# Patient Record
Sex: Female | Born: 1935 | Race: White | Hispanic: No | Marital: Married | Smoking: Never smoker
Health system: Southern US, Community
[De-identification: ages and names within clinical notes are randomized; demographics above are authoritative.]

## PROBLEM LIST (undated history)

## (undated) DIAGNOSIS — A389 Scarlet fever, uncomplicated: Secondary | ICD-10-CM

## (undated) DIAGNOSIS — E785 Hyperlipidemia, unspecified: Secondary | ICD-10-CM

## (undated) DIAGNOSIS — E119 Type 2 diabetes mellitus without complications: Secondary | ICD-10-CM

## (undated) DIAGNOSIS — J309 Allergic rhinitis, unspecified: Secondary | ICD-10-CM

## (undated) DIAGNOSIS — J329 Chronic sinusitis, unspecified: Secondary | ICD-10-CM

## (undated) DIAGNOSIS — I4891 Unspecified atrial fibrillation: Secondary | ICD-10-CM

## (undated) DIAGNOSIS — Z8719 Personal history of other diseases of the digestive system: Secondary | ICD-10-CM

## (undated) DIAGNOSIS — I10 Essential (primary) hypertension: Secondary | ICD-10-CM

## (undated) DIAGNOSIS — R439 Unspecified disturbances of smell and taste: Secondary | ICD-10-CM

## (undated) DIAGNOSIS — R51 Headache: Secondary | ICD-10-CM

## (undated) DIAGNOSIS — R42 Dizziness and giddiness: Secondary | ICD-10-CM

## (undated) DIAGNOSIS — A3790 Whooping cough, unspecified species without pneumonia: Secondary | ICD-10-CM

## (undated) DIAGNOSIS — G4700 Insomnia, unspecified: Secondary | ICD-10-CM

## (undated) HISTORY — DX: Headache: R51

## (undated) HISTORY — DX: Hyperlipidemia, unspecified: E78.5

## (undated) HISTORY — DX: Personal history of other diseases of the digestive system: Z87.19

## (undated) HISTORY — PX: TONSILLECTOMY: SHX5217

## (undated) HISTORY — PX: BILATERAL OOPHORECTOMY: SHX1221

## (undated) HISTORY — DX: Insomnia, unspecified: G47.00

## (undated) HISTORY — PX: COLONOSCOPY W/ POLYPECTOMY: SHX1380

## (undated) HISTORY — PX: CATARACT EXTRACTION, BILATERAL: SHX1313

## (undated) HISTORY — DX: Whooping cough, unspecified species without pneumonia: A37.90

## (undated) HISTORY — DX: Chronic sinusitis, unspecified: J32.9

## (undated) HISTORY — DX: Unspecified disturbances of smell and taste: R43.9

## (undated) HISTORY — DX: Dizziness and giddiness: R42

## (undated) HISTORY — PX: ABDOMINAL HYSTERECTOMY: SHX81

## (undated) HISTORY — PX: OTHER SURGICAL HISTORY: SHX169

## (undated) HISTORY — DX: Scarlet fever, uncomplicated: A38.9

## (undated) HISTORY — DX: Allergic rhinitis, unspecified: J30.9

## (undated) HISTORY — DX: Essential (primary) hypertension: I10

## (undated) HISTORY — PX: CYSTOSCOPY: SHX5120

## (undated) HISTORY — PX: HEMORRHOID SURGERY: SHX153

---

## 1999-09-22 ENCOUNTER — Ambulatory Visit (HOSPITAL_COMMUNITY): Admission: RE | Admit: 1999-09-22 | Discharge: 1999-09-22 | Payer: Self-pay | Admitting: Internal Medicine

## 1999-09-22 ENCOUNTER — Encounter: Payer: Self-pay | Admitting: Internal Medicine

## 1999-11-23 ENCOUNTER — Inpatient Hospital Stay (HOSPITAL_COMMUNITY): Admission: EM | Admit: 1999-11-23 | Discharge: 1999-11-25 | Payer: Self-pay | Admitting: Emergency Medicine

## 1999-11-23 ENCOUNTER — Encounter: Payer: Self-pay | Admitting: Internal Medicine

## 1999-11-25 ENCOUNTER — Encounter: Payer: Self-pay | Admitting: Internal Medicine

## 2000-01-20 ENCOUNTER — Ambulatory Visit (HOSPITAL_COMMUNITY): Admission: RE | Admit: 2000-01-20 | Discharge: 2000-01-20 | Payer: Self-pay | Admitting: Gastroenterology

## 2000-03-18 ENCOUNTER — Ambulatory Visit (HOSPITAL_COMMUNITY): Admission: RE | Admit: 2000-03-18 | Discharge: 2000-03-18 | Payer: Self-pay | Admitting: Obstetrics and Gynecology

## 2000-03-18 ENCOUNTER — Encounter: Payer: Self-pay | Admitting: Urology

## 2001-09-08 ENCOUNTER — Other Ambulatory Visit: Admission: RE | Admit: 2001-09-08 | Discharge: 2001-09-08 | Payer: Self-pay | Admitting: Obstetrics and Gynecology

## 2002-11-26 ENCOUNTER — Encounter: Payer: Self-pay | Admitting: Obstetrics and Gynecology

## 2002-11-28 ENCOUNTER — Inpatient Hospital Stay (HOSPITAL_COMMUNITY): Admission: RE | Admit: 2002-11-28 | Discharge: 2002-11-30 | Payer: Self-pay | Admitting: Obstetrics and Gynecology

## 2002-11-28 ENCOUNTER — Encounter (INDEPENDENT_AMBULATORY_CARE_PROVIDER_SITE_OTHER): Payer: Self-pay

## 2003-10-23 ENCOUNTER — Other Ambulatory Visit: Admission: RE | Admit: 2003-10-23 | Discharge: 2003-10-23 | Payer: Self-pay | Admitting: Obstetrics and Gynecology

## 2004-05-21 ENCOUNTER — Encounter: Admission: RE | Admit: 2004-05-21 | Discharge: 2004-05-21 | Payer: Self-pay | Admitting: Internal Medicine

## 2004-09-24 ENCOUNTER — Ambulatory Visit: Payer: Self-pay | Admitting: Internal Medicine

## 2004-11-17 ENCOUNTER — Other Ambulatory Visit: Admission: RE | Admit: 2004-11-17 | Discharge: 2004-11-17 | Payer: Self-pay | Admitting: Obstetrics and Gynecology

## 2005-04-01 ENCOUNTER — Ambulatory Visit: Payer: Self-pay | Admitting: Internal Medicine

## 2005-08-31 ENCOUNTER — Ambulatory Visit: Payer: Self-pay | Admitting: Internal Medicine

## 2005-09-13 ENCOUNTER — Ambulatory Visit: Payer: Self-pay | Admitting: Internal Medicine

## 2005-09-20 ENCOUNTER — Ambulatory Visit: Payer: Self-pay | Admitting: Internal Medicine

## 2005-11-30 ENCOUNTER — Ambulatory Visit: Payer: Self-pay | Admitting: Internal Medicine

## 2006-02-10 ENCOUNTER — Ambulatory Visit: Payer: Self-pay | Admitting: Internal Medicine

## 2006-08-31 ENCOUNTER — Ambulatory Visit: Payer: Self-pay | Admitting: Internal Medicine

## 2006-12-29 ENCOUNTER — Ambulatory Visit: Payer: Self-pay | Admitting: Internal Medicine

## 2006-12-29 LAB — CONVERTED CEMR LAB
Basophils Absolute: 0.1 10*3/uL (ref 0.0–0.1)
Basophils Relative: 0.7 % (ref 0.0–1.0)
Calcium: 9.4 mg/dL (ref 8.4–10.5)
HCT: 38 % (ref 36.0–46.0)
Hemoglobin: 12.7 g/dL (ref 12.0–15.0)
MCHC: 33.5 g/dL (ref 30.0–36.0)
MCV: 90.9 fL (ref 78.0–100.0)
Monocytes Relative: 6.5 % (ref 3.0–11.0)
Platelets: 352 10*3/uL (ref 150–400)
RBC: 4.18 M/uL (ref 3.87–5.11)

## 2007-01-02 ENCOUNTER — Ambulatory Visit: Payer: Self-pay | Admitting: Internal Medicine

## 2007-01-02 LAB — CONVERTED CEMR LAB
Glucose, 2 hour: 278 mg/dL — ABNORMAL HIGH (ref 70–139)
Glucose, Fasting: 122 mg/dL — ABNORMAL HIGH (ref 70–99)

## 2007-05-03 ENCOUNTER — Ambulatory Visit: Payer: Self-pay | Admitting: Internal Medicine

## 2007-07-20 ENCOUNTER — Ambulatory Visit: Payer: Self-pay | Admitting: Internal Medicine

## 2007-08-29 ENCOUNTER — Ambulatory Visit: Payer: Self-pay | Admitting: Internal Medicine

## 2007-09-11 ENCOUNTER — Encounter: Admission: RE | Admit: 2007-09-11 | Discharge: 2007-09-11 | Payer: Self-pay | Admitting: Orthopedic Surgery

## 2007-09-12 ENCOUNTER — Ambulatory Visit (HOSPITAL_BASED_OUTPATIENT_CLINIC_OR_DEPARTMENT_OTHER): Admission: RE | Admit: 2007-09-12 | Discharge: 2007-09-12 | Payer: Self-pay | Admitting: Orthopedic Surgery

## 2007-09-26 ENCOUNTER — Encounter: Payer: Self-pay | Admitting: Internal Medicine

## 2007-10-16 ENCOUNTER — Encounter: Payer: Self-pay | Admitting: Internal Medicine

## 2007-10-30 ENCOUNTER — Encounter: Payer: Self-pay | Admitting: *Deleted

## 2007-10-30 DIAGNOSIS — J329 Chronic sinusitis, unspecified: Secondary | ICD-10-CM | POA: Insufficient documentation

## 2007-10-30 DIAGNOSIS — R439 Unspecified disturbances of smell and taste: Secondary | ICD-10-CM | POA: Insufficient documentation

## 2007-10-30 DIAGNOSIS — Z8719 Personal history of other diseases of the digestive system: Secondary | ICD-10-CM | POA: Insufficient documentation

## 2007-10-30 DIAGNOSIS — Z9849 Cataract extraction status, unspecified eye: Secondary | ICD-10-CM | POA: Insufficient documentation

## 2007-10-30 DIAGNOSIS — Z87898 Personal history of other specified conditions: Secondary | ICD-10-CM | POA: Insufficient documentation

## 2007-10-30 DIAGNOSIS — E785 Hyperlipidemia, unspecified: Secondary | ICD-10-CM | POA: Insufficient documentation

## 2007-10-30 DIAGNOSIS — A3790 Whooping cough, unspecified species without pneumonia: Secondary | ICD-10-CM | POA: Insufficient documentation

## 2007-10-30 DIAGNOSIS — J309 Allergic rhinitis, unspecified: Secondary | ICD-10-CM | POA: Insufficient documentation

## 2007-10-30 DIAGNOSIS — Z9079 Acquired absence of other genital organ(s): Secondary | ICD-10-CM | POA: Insufficient documentation

## 2007-10-30 DIAGNOSIS — Z9189 Other specified personal risk factors, not elsewhere classified: Secondary | ICD-10-CM | POA: Insufficient documentation

## 2007-10-30 DIAGNOSIS — A389 Scarlet fever, uncomplicated: Secondary | ICD-10-CM | POA: Insufficient documentation

## 2007-10-30 DIAGNOSIS — I10 Essential (primary) hypertension: Secondary | ICD-10-CM | POA: Insufficient documentation

## 2007-10-30 DIAGNOSIS — G4700 Insomnia, unspecified: Secondary | ICD-10-CM | POA: Insufficient documentation

## 2007-10-30 DIAGNOSIS — Z9889 Other specified postprocedural states: Secondary | ICD-10-CM | POA: Insufficient documentation

## 2008-01-05 ENCOUNTER — Encounter: Payer: Self-pay | Admitting: Internal Medicine

## 2008-02-07 LAB — HM MAMMOGRAPHY: HM Mammogram: NORMAL

## 2008-03-27 ENCOUNTER — Ambulatory Visit: Payer: Self-pay | Admitting: Internal Medicine

## 2008-03-27 LAB — CONVERTED CEMR LAB
BUN: 12 mg/dL (ref 6–23)
CO2: 32 meq/L (ref 19–32)
Calcium: 9.4 mg/dL (ref 8.4–10.5)
Chloride: 106 meq/L (ref 96–112)
Cholesterol: 109 mg/dL (ref 0–200)
Creatinine, Ser: 0.6 mg/dL (ref 0.4–1.2)
GFR calc Af Amer: 125 mL/min
HDL: 37.1 mg/dL — ABNORMAL LOW (ref >39.0)
LDL Cholesterol: 51 mg/dL (ref 0–99)
Potassium: 4.9 meq/L (ref 3.5–5.1)
Sodium: 143 meq/L (ref 135–145)
Total CHOL/HDL Ratio: 2.9
Total Protein: 7.1 g/dL (ref 6.0–8.3)
Triglycerides: 106 mg/dL (ref 0–149)
VLDL: 21 mg/dL (ref 0–40)

## 2008-04-25 ENCOUNTER — Telehealth: Payer: Self-pay | Admitting: Internal Medicine

## 2008-09-02 ENCOUNTER — Ambulatory Visit: Payer: Self-pay | Admitting: Internal Medicine

## 2009-04-02 ENCOUNTER — Telehealth: Payer: Self-pay | Admitting: Internal Medicine

## 2009-04-10 ENCOUNTER — Encounter: Payer: Self-pay | Admitting: Internal Medicine

## 2009-04-29 ENCOUNTER — Telehealth: Payer: Self-pay | Admitting: Internal Medicine

## 2009-05-05 ENCOUNTER — Ambulatory Visit: Payer: Self-pay | Admitting: Internal Medicine

## 2009-05-09 ENCOUNTER — Ambulatory Visit: Payer: Self-pay | Admitting: Internal Medicine

## 2009-05-09 LAB — CONVERTED CEMR LAB
ALT: 14 units/L (ref 0–35)
Albumin: 4 g/dL (ref 3.5–5.2)
BUN: 15 mg/dL (ref 6–23)
Bilirubin, Direct: 0.1 mg/dL (ref 0.0–0.3)
Eosinophils Absolute: 0.3 10*3/uL (ref 0.0–0.7)
Eosinophils Relative: 4.7 % (ref 0.0–5.0)
GFR calc non Af Amer: 86.44 mL/min (ref >60)
Glucose, Bld: 106 mg/dL — ABNORMAL HIGH (ref 70–99)
HCT: 36.4 % (ref 36.0–46.0)
Hemoglobin: 12.3 g/dL (ref 12.0–15.0)
Monocytes Absolute: 0.5 10*3/uL (ref 0.1–1.0)
Platelets: 254 10*3/uL (ref 150.0–400.0)
Potassium: 5.2 meq/L — ABNORMAL HIGH (ref 3.5–5.1)
RBC: 3.91 M/uL (ref 3.87–5.11)
Sodium: 143 meq/L (ref 135–145)
TSH: 1.41 microintl units/mL (ref 0.35–5.50)
Total Bilirubin: 0.7 mg/dL (ref 0.3–1.2)
Total CHOL/HDL Ratio: 3
VLDL: 24.8 mg/dL (ref 0.0–40.0)
WBC: 7.4 10*3/uL (ref 4.5–10.5)

## 2009-08-13 ENCOUNTER — Ambulatory Visit: Payer: Self-pay | Admitting: Internal Medicine

## 2009-11-03 ENCOUNTER — Telehealth: Payer: Self-pay | Admitting: Internal Medicine

## 2009-12-10 ENCOUNTER — Telehealth: Payer: Self-pay | Admitting: Internal Medicine

## 2009-12-11 ENCOUNTER — Telehealth (INDEPENDENT_AMBULATORY_CARE_PROVIDER_SITE_OTHER): Payer: Self-pay | Admitting: *Deleted

## 2009-12-11 ENCOUNTER — Encounter: Payer: Self-pay | Admitting: Internal Medicine

## 2009-12-15 ENCOUNTER — Ambulatory Visit: Payer: Self-pay | Admitting: Internal Medicine

## 2009-12-16 ENCOUNTER — Telehealth: Payer: Self-pay | Admitting: Internal Medicine

## 2009-12-16 DIAGNOSIS — E1140 Type 2 diabetes mellitus with diabetic neuropathy, unspecified: Secondary | ICD-10-CM | POA: Insufficient documentation

## 2010-01-05 ENCOUNTER — Telehealth: Payer: Self-pay | Admitting: Internal Medicine

## 2010-03-17 ENCOUNTER — Telehealth: Payer: Self-pay | Admitting: Internal Medicine

## 2010-03-30 ENCOUNTER — Encounter: Payer: Self-pay | Admitting: Internal Medicine

## 2010-05-08 ENCOUNTER — Ambulatory Visit: Payer: Self-pay | Admitting: Internal Medicine

## 2010-05-08 LAB — CONVERTED CEMR LAB
AST: 27 units/L (ref 0–37)
Albumin: 4.3 g/dL (ref 3.5–5.2)
Alkaline Phosphatase: 49 units/L (ref 39–117)
Basophils Relative: 0.4 % (ref 0.0–3.0)
Chloride: 109 meq/L (ref 96–112)
Cholesterol: 106 mg/dL (ref 0–200)
Creatinine, Ser: 0.8 mg/dL (ref 0.4–1.2)
HDL: 48.6 mg/dL (ref >39.00)
Lymphocytes Relative: 32.3 % (ref 12.0–46.0)
Lymphs Abs: 2.8 10*3/uL (ref 0.7–4.0)
MCHC: 33.8 g/dL (ref 30.0–36.0)
MCV: 92.5 fL (ref 78.0–100.0)
Neutro Abs: 4.8 10*3/uL (ref 1.4–7.7)
Neutrophils Relative %: 56.7 % (ref 43.0–77.0)
RDW: 13.2 % (ref 11.5–14.6)
Total Bilirubin: 0.6 mg/dL (ref 0.3–1.2)
Total CHOL/HDL Ratio: 2
Total Protein: 7.2 g/dL (ref 6.0–8.3)

## 2010-07-24 ENCOUNTER — Telehealth: Payer: Self-pay | Admitting: Internal Medicine

## 2010-08-06 ENCOUNTER — Ambulatory Visit: Payer: Self-pay | Admitting: Internal Medicine

## 2010-12-17 NOTE — Progress Notes (Signed)
  Phone Note Refill Request Message from:  Fax from Pharmacy on Mar 17, 2010 4:40 PM  Refills Requested: Medication #1:  METOPROLOL TARTRATE 50 MG  TABS Take 1 1/2 tablets twice daily Initial call taken by: Ami Bullins CMA,  Mar 17, 2010 4:40 PM    Prescriptions: METOPROLOL TARTRATE 50 MG  TABS (METOPROLOL TARTRATE) Take 1 1/2 tablets twice daily  #270 x 2   Entered by:   Ami Bullins CMA   Authorized by:   Jacques Navy MD   Signed by:   Bill Salinas CMA on 03/17/2010   Method used:   Electronically to        [facility] [address] (retail)       [address]       [place], [place]  [address]       Ph: [phone] or [phone]       Fax: [phone]   RxID:   [id number]

## 2010-12-17 NOTE — Progress Notes (Signed)
Summary: Ambien refill  Phone Note Refill Request Message from:  Fax from Pharmacy on January 05, 2010 11:18 AM  Refills Requested: Medication #1:  AMBIEN 10 MG  TABS Take as needed and as directed.   Last Refilled: 10/27/2009 pt had CPX on 05/05/2009, Please Advise refill  Initial call taken by: Ami Bullins CMA,  January 05, 2010 11:19 AM  Follow-up for Phone Call        ok to refill prn Follow-up by: Jacques Navy MD,  January 05, 2010 4:08 PM    Prescriptions: AMBIEN 10 MG  TABS (ZOLPIDEM TARTRATE) Take as needed and as directed.  #15 x 12   Entered by:   Lucious Groves   Authorized by:   Jacques Navy MD   Signed by:   Lucious Groves on 01/06/2010   Method used:   Telephoned to ...       [facility] [address] (retail)       [address]       [place], [place]  [address]       Ph: [phone] or [phone]       Fax: [phone]   RxID:   [id number]

## 2010-12-17 NOTE — Progress Notes (Signed)
  Phone Note Refill Request Message from:  Fax from Pharmacy on July 24, 2010 2:47 PM  Refills Requested: Medication #1:  AMBIEN 10 MG  TABS Take as needed and as directed.   Last Refilled: 05/25/2010 fax from [facility] [address] please Advise refill  Initial call taken by: Ami Bullins CMA,  July 24, 2010 2:47 PM  Follow-up for Phone Call        OK for refill x 5 Follow-up by: Jacques Navy MD,  July 24, 2010 4:48 PM    Prescriptions: AMBIEN 10 MG  TABS (ZOLPIDEM TARTRATE) Take as needed and as directed.  #15 x 5   Entered by:   Ami Bullins CMA   Authorized by:   Jacques Navy MD   Signed by:   Bill Salinas CMA on 07/24/2010   Method used:   Telephoned to ...       [facility] [address] [facility] (retail)       [address]       [place], [place]  [address]       Ph: [phone] or [phone]       Fax: [phone]   RxID:   [id number]

## 2010-12-17 NOTE — Assessment & Plan Note (Signed)
Summary: YEARLY---STC   Vital Signs:  Patient profile:   77 year old female Height:      64 inches Weight:      127 pounds BMI:     21.88 O2 Sat:      97 % on Room air Temp:     97.2 degrees F oral Pulse rate:   54 / minute BP sitting:   110 / 62  (left arm) Cuff size:   regular  Vitals Entered By: Bill Salinas CMA (May 08, 2010 9:56 AM)  O2 Flow:  Room air CC: pt here for cpx, she states her last colonoscopy was done with Dr Laural Benes ab  Vision Screening:      Vision Comments: Last eye exam was April 2011 with Dr Leonia Corona at [facility]. 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  History of Present Illness: Patient presents for health maintenance exam. She has not had any new medical problems or injuries or surgeries in the interval.   She still has trouble with her knees: trouble going up stairs, walking is painful. She is taking celebrex which does help with her pain.   Current Medications (verified): 1)  Altace 2.5 Mg  Caps (Ramipril) .... Take One Capsule One Daily 2)  Aspirin 81 Mg  Tabs (Aspirin) .... Take One Tablet Once Daily 3)  Metoprolol Tartrate 50 Mg  Tabs (Metoprolol Tartrate) .... Take 1 1/2 Tablets Twice Daily 4)  Vytorin 10-80 Mg  Tabs (Ezetimibe-Simvastatin) .... Take One Tablet Once Dail 5)  Calcium 1200mg  .... Take One Tablet Twice Daily 6)  Multivitamins   Tabs (Multiple Vitamin) .... Take One Tablet Once Daily 7)  Bl Vitamin C 1000 Mg  Tabs (Ascorbic Acid) .... Take 2 Tablets Twice Daily 8)  Omega 3 2000mg  .... Take Twice Daily 9)  Ambien 10 Mg  Tabs (Zolpidem Tartrate) .... Take As Needed and As Directed. 10)  Prilosec Otc .... As Needed 11)  Celebrex 200 Mg Caps (Celecoxib) .... Take 1 Tablet By Mouth Once A Day 12)  Vitamin C 500mg  .... Take 1  Tablet By Mouth Once A Day 13)  Betamethasone Dipropionate 0.05 % Crea (Betamethasone Dipropionate) .... Apply Once Daily To Hands As Needed  Allergies: 1)  ! Demerol 2)  ! Tetracycline 3)  ! Erythromycin 4)  ! Cleocin 5)  ! * Actifed 6)  ! Morphine  Past History:  Past Medical History: Last updated: 10/30/2007 HYPERTENSION (ICD-401.9) ALLERGIC RHINITIS (ICD-477.9) DYSPEPSIA, HX OF (ICD-V12.79) ANOSMIA (ICD-781.1) INSOMNIA (ICD-780.52) SINUSITIS, CHRONIC (ICD-473.9) HYPERLIPIDEMIA (ICD-272.4) HEADACHES, HX OF (ICD-V13.8) Hx of WHOOPING COUGH (ICD-033.9) Hx of SCARLET FEVER (ICD-034.1)  Past Surgical History: Last updated: 10/30/2007 OOPHORECTOMY, BILATERAL, HX OF (ICD-V45.77) * CYSTOSCOPY WITH STENTING OF BOTH URETERS. HYDROSALPINX DIAGNOSTIC LAPAROSCOPY (ICD-614.1) * CORRECTION OF HAMMERTOE DEFORMITY * VARICOSE VEIN SURGERY CATARACT EXTRACTIONS, BILATERAL, HX OF (ICD-V45.61) HYSTERECTOMY, HX OF (ICD-V45.77) HEMORRHOIDECTOMY, HX OF (ICD-V45.89) TONSILLECTOMY, UNDER AGE 82, HX OF (ICD-V15.89)    Family History: Last updated: Apr 15, 2008 father- deceased @67 : CVA, HTN, ?lipids mother-deceased @ 75: ruptured viscus-?cancer, coagulopathy, DM Neg- colon cancer PGM-breast cancer  Social History: Last updated: 05/05/2009 HSG married - '52 1 son - '61, lost a son ; 2 daughter '54, '63; 7 grandchildren; 4 great-grands Mostly  homemaker, 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 Death of step-grandson by suicide - Sp. '10  Risk Factors: Alcohol Use: 0 (05/05/2009) Caffeine Use: 2 cups (05/05/2009) Diet: healthy (05/05/2009) Exercise: yes (05/05/2009)  Risk Factors: Smoking Status: never (05/05/2009)  Review of Systems       The patient complains of weight loss.  The patient denies anorexia, fever, weight gain, vision loss, decreased hearing, chest pain, syncope, dyspnea on exertion, peripheral edema, prolonged cough, abdominal pain, severe  indigestion/heartburn, muscle weakness, transient blindness, difficulty walking, depression, enlarged lymph nodes, angioedema, and breast masses.         133.5 down to 127.  Climbing stair is hard  Physical Exam  General:  WNWD white female in no distress Head:  Normocephalic and atraumatic without obvious abnormalities. No apparent alopecia or balding. Eyes:  vision grossly intact, pupils equal, pupils round, corneas and lenses clear, and no injection.   Ears:  R ear normal and L ear normal.   Nose:  no external deformity and no external erythema.   Mouth:  Oral mucosa and oropharynx without lesions or exudates.  Teeth in good repair. Neck:  supple, full ROM, no thyromegaly, and no carotid bruits.   Chest Wall:  No deformities, masses, or tenderness noted. Breasts:  deferred to gyn Lungs:  Normal respiratory effort, chest expands symmetrically. Lungs are clear to auscultation, no crackles or wheezes. Heart:  Normal rate and regular rhythm. S1 and S2 normal without gallop, murmur, click, rub or other extra sounds. Abdomen:  soft, non-tender, normal bowel sounds, and no guarding.   Genitalia:  deferred to gyn Msk:  normal ROM, no joint tenderness, no joint swelling, no redness over joints, and no joint deformities.   Pulses:  2+ radial pulses Extremities:  No clubbing, cyanosis, edema, or deformity noted with normal full range of motion of all joints.  Knees without effusion, full ROM. Neurologic:  alert & oriented X3, cranial nerves II-XII intact, strength normal in all extremities, gait normal, and DTRs symmetrical and normal.   Skin:  turgor normal, color normal, and no ulcerations.   Cervical Nodes:  no anterior cervical adenopathy and no posterior cervical adenopathy.   Psych:  Oriented X3, memory intact for recent and remote, normally interactive, and good eye contact.     Impression & Recommendations:  Problem # 1:  DIABETES MELLITUS, CONTROLLED (ICD-250.00) Due for A1C with  recommendations to follow.  Her updated medication list for this problem includes:    Altace 2.5 Mg Caps (Ramipril) .Marland Kitchen... Take one capsule one daily    Aspirin 81 Mg Tabs (Aspirin) .Marland Kitchen... Take one tablet once daily  Orders: TLB-A1C / Hgb A1C (Glycohemoglobin) (83036-A1C)  Addendum - A1C 6.5% - good control with diet management alone.  Problem # 2:  HYPERTENSION (ICD-401.9)  Her updated medication list for this problem includes:    Altace 2.5 Mg Caps (Ramipril) .Marland Kitchen... Take one capsule one daily    Metoprolol Tartrate 50 Mg Tabs (Metoprolol tartrate) .Marland Kitchen... Take 1 1/2 tablets twice daily  Orders: TLB-BMP (Basic Metabolic Panel-BMET) (80048-METABOL)  BP today: 110/62 Prior BP: 128/72 (05/05/2009)  Good control on present medications. Lab results are normal for renal function and electrolytes.  Problem # 3:  DYSPEPSIA, HX OF (ICD-V12.79) Stable with no active complaints of discomfort or indigestion.  Problem # 4:  HYPERLIPIDEMIA (ICD-272.4) Due for routine lab with recommendations to follow.  Her updated medication list for this problem includes:    Vytorin 10-80 Mg Tabs (  Ezetimibe-simvastatin) .Marland Kitchen... Take one tablet once dail  Orders: TLB-Lipid Panel (80061-LIPID) TLB-Hepatic/Liver Function Pnl (80076-HEPATIC) TLB-TSH (Thyroid Stimulating Hormone) (84443-TSH)  Addendum - LDL 35! Excellent control. She has been on high dose simvastatin for greatert than 1 year with no adverse effects.  Plan - continue present medications.  Problem # 5:  Preventive Health Care (ICD-V70.0) Interval history is benign except for on-going knee pain. She does not contemplate TKR at this time although this may be a need in the future. Her exam is unremarkable and lab results are within normal limits. She is current is Gyn, breast cancer screening, colon cancer screening with colonoscopy in '07. Immunizations - Penumovax '04, Shingles '08. She may be due for tetnus.   She is in good spirits with no  evidence of depression. She remains active despite her knee pain and has recognizes the need for care to prevent falls or injury.  In summary - a very nice woman who appears to be medically stable at this time with good control of her chronic ailments. She will return as needed or 1 year.  Complete Medication List: 1)  Altace 2.5 Mg Caps (Ramipril) .... Take one capsule one daily 2)  Aspirin 81 Mg Tabs (Aspirin) .... Take one tablet once daily 3)  Metoprolol Tartrate 50 Mg Tabs (Metoprolol tartrate) .... Take 1 1/2 tablets twice daily 4)  Vytorin 10-80 Mg Tabs (Ezetimibe-simvastatin) .... Take one tablet once dail 5)  Calcium 1200mg   .... Take one tablet twice daily 6)  Multivitamins Tabs (Multiple vitamin) .... Take one tablet once daily 7)  Bl Vitamin C 1000 Mg Tabs (Ascorbic acid) .... Take 2 tablets twice daily 8)  Omega 3 2000mg   .... Take twice daily 9)  Ambien 10 Mg Tabs (Zolpidem tartrate) .... Take as needed and as directed. 10)  Prilosec Otc  .... As needed 11)  Celebrex 200 Mg Caps (Celecoxib) .... Take 1 tablet by mouth once a day 12)  Vitamin C 500mg   .... Take 1 tablet by mouth once a day 13)  Betamethasone Dipropionate 0.05 % Crea (Betamethasone dipropionate) .... Apply once daily to hands as needed  Other Orders: Subsequent annual wellness visit with prevention plan (X9147) TLB-CBC Platelet - w/Differential (85025-CBCD)   Patient: Toni Mcmillan Note: All result statuses are Final unless otherwise noted.  Tests: (1) Hemoglobin A1C (A1C)   Hemoglobin A1C            6.4 %                       4.6-6.5     Glycemic Control Guidelines for People with Diabetes:     Non Diabetic:  <6%     Goal of Therapy: <7%     Additional Action Suggested:  >8%   Tests: (2) BMP (METABOL)   Sodium                    143 mEq/L                   135-145   Potassium            [H]  5.5 mEq/L                   3.5-5.1   Chloride                  109 mEq/L  96-112    Carbon Dioxide            31 mEq/L                    19-32   Glucose              [H]  102 mg/dL                   60-45   BUN                       19 mg/dL                    4-09   Creatinine                0.8 mg/dL                   8.1-1.9   Calcium                   9.7 mg/dL                   1.4-78.2   GFR                       77.23 mL/min                >60  Tests: (3) Lipid Panel (LIPID)   Cholesterol               106 mg/dL                   9-562     ATP III Classification            Desirable:  < 200 mg/dL                    Borderline High:  200 - 239 mg/dL               High:  > = 240 mg/dL   Triglycerides             114.0 mg/dL                 1.3-086.5     Normal:  <150 mg/dL     Borderline High:  784 - 199 mg/dL   HDL                       69.62 mg/dL                 >95.28   VLDL Cholesterol          22.8 mg/dL                  4.1-32.4   LDL Cholesterol           35 mg/dL                    4-01  CHO/HDL Ratio:  CHD Risk                             2                    Men          Women     1/2 Average Risk  3.4          3.3     Average Risk          5.0          4.4     2X Average Risk          9.6          7.1     3X Average Risk          15.0          11.0                           Tests: (4) Hepatic/Liver Function Panel (HEPATIC)   Total Bilirubin           0.6 mg/dL                   2.7-2.5   Direct Bilirubin          0.1 mg/dL                   3.6-6.4   Alkaline Phosphatase      49 U/L                      39-117   AST                       27 U/L                      0-37   ALT                       15 U/L                      0-35   Total Protein             7.2 g/dL                    4.0-3.4   Albumin                   4.3 g/dL                    7.4-2.5  Tests: (5) CBC Platelet w/Diff (CBCD)   White Cell Count          8.5 K/uL                    4.5-10.5   Red Cell Count       [L]  3.86 Mil/uL                 3.87-5.11   Hemoglobin                 12.0 g/dL                   95.6-38.7   Hematocrit           [L]  35.7 %                      36.0-46.0   MCV                       92.5 fl  78.0-100.0   MCHC                      33.8 g/dL                   78.2-95.6   RDW                       13.2 %                      11.5-14.6   Platelet Count            305.0 K/uL                  150.0-400.0   Neutrophil %              56.7 %                      43.0-77.0   Lymphocyte %              32.3 %                      12.0-46.0   Monocyte %                7.3 %                       3.0-12.0   Eosinophils%              3.3 %                       0.0-5.0   Basophils %               0.4 %                       0.0-3.0   Neutrophill Absolute      4.8 K/uL                    1.4-7.7   Lymphocyte Absolute       2.8 K/uL                    0.7-4.0   Monocyte Absolute         0.6 K/uL                    0.1-1.0  Eosinophils, Absolute                             0.3 K/uL                    0.0-0.7   Basophils Absolute        0.0 K/uL                    0.0-0.1  Tests: (6) TSH (TSH)   FastTSH                   0.97 uIU/mL                 0.35-5.50  Preventive Care Screening  Bone Density:    Date:  04/10/2009    Results:  abnormal std dev

## 2010-12-17 NOTE — Assessment & Plan Note (Signed)
Summary: FLU SHOT/ MEN /NWS  Nurse Visit   Allergies: 1)  ! Demerol 2)  ! Tetracycline 3)  ! Erythromycin 4)  ! Cleocin 5)  ! * Actifed 6)  ! Morphine  Orders Added: 1)  Flu Vaccine 34yrs + MEDICARE PATIENTS [Q2039] 2)  Administration Flu vaccine - MCR [G0008] Flu Vaccine Consent Questions     Do you have a history of severe allergic reactions to this vaccine? no    Any prior history of allergic reactions to egg and/or gelatin? no    Do you have a sensitivity to the preservative Thimersol? no    Do you have a past history of Guillan-Barre Syndrome? no    Do you currently have an acute febrile illness? no    Have you ever had a severe reaction to latex? no    Vaccine information given and explained to patient? yes    Are you currently pregnant? no    Lot Number:[id number]   Exp Date:05/15/2011   Site Given  Left Deltoid IM.lbmedflu

## 2010-12-17 NOTE — Progress Notes (Signed)
Summary: diabetic labs  Phone Note Call from Patient Call back at Home Phone [phone]   Summary of Call: Patient has tried doing very well in regards to her diet. She has lost several pounds and would like diabetes testing done again, per the patient she is "borderline". Patient said that she last had these done in Sept 2010. Please advise. Initial call taken by: Lucious Groves,  December 11, 2009 12:02 PM  Follow-up for Phone Call        OK for A1C 790.6 Follow-up by: Jacques Navy MD,  December 11, 2009 1:21 PM  Additional Follow-up for Phone Call Additional follow up Details #1::        Pt informed, please put lab in IDX Additional Follow-up by: Lamar Sprinkles, CMA,  December 11, 2009 5:54 PM    Additional Follow-up for Phone Call Additional follow up Details #2::    Lab in IDX. Follow-up by: Verdell Face,  December 15, 2009 8:04 AM

## 2010-12-17 NOTE — Progress Notes (Signed)
  Phone Note Outgoing Call   Reason for Call: Discuss lab or test results Summary of Call: lab A1C 6.7%  patient called Initial call taken by: Jacques Navy MD,  December 16, 2009 9:10 AM  New Problems: DIABETES MELLITUS, CONTROLLED (ICD-250.00)   New Problems: DIABETES MELLITUS, CONTROLLED (ICD-250.00)

## 2010-12-17 NOTE — Progress Notes (Signed)
Summary: Vytorin alternative  Phone Note Call from Patient Call back at Home Phone [phone]   Caller: [employer]--[phone]  Call For: ID:  [phone]  Summary of Call: Patient left message on triage that her insurance company [facility] [employer])will no longer cover Vytorin. ( I am already aware that they are stopping coverage for Benicar and Vytorin, luckily she is not on Benicar)  Which prescription should the patient be switched to? Please advise. Initial call taken by: Lucious Groves,  December 10, 2009 10:41 AM  Follow-up for Phone Call        Patient is controlled on vytorin 10/80. Being on maximum dose of simvastatin does not allow single drug therapy in patient with known CAD.   Option #1 PA for vytorin Option #2 simvastatin 80 + estimbe 10 as seperate prescriptions Option #3 (least desireable) simvastatin 80 + Welchol daily. Follow-up by: Jacques Navy MD,  December 10, 2009 1:32 PM  Additional Follow-up for Phone Call Additional follow up Details #1::        Patient stopped by the office and prefers to take Vytorin if we can get it. I made the pat aware that I will try to get via PA. Patient will call to let me know how many pills she has at home.  **Spoke with [employer] and they will fax 2 forms. Additional Follow-up by: Lucious Groves,  December 11, 2009 12:01 PM    Additional Follow-up for Phone Call Additional follow up Details #2::    forms awaiting MD completion.Lucious Groves,  December 11, 2009 1:58 PM  Vytorin approved with no expiration date. Patient notified. Follow-up by: Lucious Groves,  December 12, 2009 4:05 PM

## 2010-12-17 NOTE — Letter (Signed)
[facility]   Imported By: Lester Zemple 04/09/2010 09:14:22  _____________________________________________________________________  External Attachment:    Type:   Image     Comment:   External Document

## 2010-12-17 NOTE — Medication Information (Signed)
Summary: [facility] form/[employer] [place]  [facility] [facility] [place]   Imported By: Lester Hardin 01/03/2010 09:08:27  _____________________________________________________________________  External Attachment:    Type:   Image     Comment:   External Document

## 2010-12-18 ENCOUNTER — Telehealth: Payer: Self-pay | Admitting: Internal Medicine

## 2010-12-23 NOTE — Progress Notes (Signed)
  Phone Note Refill Request Message from:  Fax from Pharmacy on December 18, 2010 8:07 AM  Refills Requested: Medication #1:  METOPROLOL TARTRATE 50 MG  TABS Take 1 1/2 tablets twice daily Initial call taken by: Ami Bullins CMA,  December 18, 2010 8:07 AM    Prescriptions: METOPROLOL TARTRATE 50 MG  TABS (METOPROLOL TARTRATE) Take 1 1/2 tablets twice daily  #270 x 2   Entered by:   Ami Bullins CMA   Authorized by:   Jacques Navy MD   Signed by:   Bill Salinas CMA on 12/18/2010   Method used:   Electronically to        [facility] [address] (retail)       [address]       [place], [place]  [address]       Ph: [phone] or [phone]       Fax: [phone]   RxID:   [id number]

## 2011-02-25 ENCOUNTER — Telehealth: Payer: Self-pay | Admitting: *Deleted

## 2011-02-25 NOTE — Telephone Encounter (Signed)
k to refill prn

## 2011-02-25 NOTE — Telephone Encounter (Signed)
Fax from [facility] [address] for Zolpidem tartrate 10mg  take one tablet by mouth as directed QTY 15 last filled 10/03/2010. Please Advise refills

## 2011-02-26 MED ORDER — ZOLPIDEM TARTRATE 10 MG PO TABS: 10.0000 mg | ORAL_TABLET | Freq: Every evening | ORAL | Status: DC | PRN

## 2011-02-26 NOTE — Telephone Encounter (Signed)
refill 

## 2011-03-23 ENCOUNTER — Telehealth: Payer: Self-pay | Admitting: *Deleted

## 2011-03-23 NOTE — Telephone Encounter (Signed)
[place] and requesting RX's for amoxicillin and eye drops for pink eye to have on hand in case of infection.

## 2011-03-24 MED ORDER — CIPROFLOXACIN HCL 500 MG PO TABS: ORAL_TABLET | ORAL | Status: AC

## 2011-03-24 MED ORDER — GENTAMICIN SULFATE 0.3 % OP SOLN: OPHTHALMIC | Status: AC

## 2011-03-24 NOTE — Telephone Encounter (Signed)
Left mess to call office back.  - RX's sent in

## 2011-03-24 NOTE — Telephone Encounter (Signed)
See response to Edwena Felty

## 2011-03-25 NOTE — Telephone Encounter (Signed)
Patient informed. 

## 2011-03-30 NOTE — Letter (Signed)
July 20, 2007    Leonides Grills, M.D.  [address]  [place], [place] [address]   RE:  Toni Mcmillan, Toni Mcmillan  MRN:  [id number]  /  DOB:  [birth year]   Dear Renae Fickle:   I had seen Toni Mcmillan today in pre-op evaluation for  anticipated surgery on the 5th digit of her left foot.   Enclosed please fine my complete H&P dictated December 29, 2006.  In the  interval since that visit, the patient has been seen and evaluated at  [facility] including a stress nuclear study which  shows no evidence of any ischemia with an ejection fraction of 79%.  It  was not felt that she had any significant coronary artery disease by  this study.   The patient has been diagnosed as a diabetic since her last full  physical exam.  She had positive 2-hour glucose tolerance test.  She has  been following a sugar free low carb diet and her last hemoglobin A1c  from May 03, 2007, was 6.9%, indicating good control.   It is my opinion that Toni Mcmillan is a stable candidate for general  anesthesia and surgery.  She has only minimal increased risk due to her  hypertension and diabetes, although both are well controlled at this  time.   Should she require any inpatient stay after her surgery, my colleagues  and myself would be happy to follow her along in hospital.   If I can provide any additional information, please do not hesitate to  contact me.   As always I appreciate the care you have rendered to my patient.    Sincerely,      Rosalyn Gess. Norins, MD  Electronically Signed    MEN/MedQ  DD: 07/20/2007  DT: 07/20/2007  Job #: [id number]

## 2011-03-30 NOTE — Op Note (Signed)
NAMECLARISSE, Toni Mcmillan             ACCOUNT NO.:  [id number]   MEDICAL RECORD NO.:  [id number]          PATIENT TYPE:  AMB   LOCATION:  DSC                          FACILITY:  [facility]   PHYSICIAN:  Leonides Grills, M.D.     DATE OF BIRTH:  1931/02/06   DATE OF PROCEDURE:  09/12/2007  DATE OF DISCHARGE:                               OPERATIVE REPORT   PREOPERATIVE DIAGNOSES:  1. Right fifth toe proximal phalanx head spur.  2. Right fifth hammertoe.   POSTOPERATIVE DIAGNOSES:  1. Right fifth toe proximal phalanx head spur.  2. Right fifth hammertoe.   OPERATION:  1. Left fifth toe proximal phalanx head resection.  2. Left fifth toe extensor digitorum longus  shortening.   ANESTHESIA:  General.   SURGEON:  Leonides Grills MD   ASSISTANT:  Evlyn Kanner, P.A.   POSTOPERATIVE COURSE:  The patient follow-up in 2 weeks, at that time  will remove the dressing as well as suture.  She will then be able to go  into a normal shoe and buddy tape the fifth toe to the fourth toe to  protect the toe while the tendon heals.  Ice and elevation are  encouraged as well as scar tissue mobilization.  If she is more in hard  sole shoe as opposed to her regular shoe, she may wear this for at least  up to four to six weeks.   INDICATIONS:  This is a 77 year old female who has had longstanding left  fifth toe pain that is interfering with her life to the point she cannot  do what she wants to do.  She was consented to the above procedure.  All  risks include infection, nerve or vessel injury, toe deformity,  persistent pain, worse pain, prolonged recovery, stiffness of the toe  were all explained, questions were encouraged and answered.   OPERATION:  The patient brought to the operating room, placed in supine  position initially after adequate general endotracheal tube anesthesia  was administered as well as Ancef 1 gram IV piggyback.  Bump was placed  under left ipsilateral hip.  Left lower extremity is  prepped, draped in  sterile manner over a proximally thigh tourniquet.  Limb was gravity  exsanguinated.  Tourniquet elevated to 290 mmHg.  A longitudinal  incision dorsal aspect left fifth toe was then made.  Dissection was  carried down through skin.  Hemostasis was obtained.  EDL was then  identified and elevated off the left fifth toe proximal phalanx head.  Then with a sagittal saw the fifth toe proximal phalanx head was then  removed.  The toe was then and once this was removed the rongeur, the  joint was then reduced and the EDL was then shortened by cutting it  transversely and then repairing it pants over vest type manner with 3-0  Vicryl stitch.  This had an Conservation officer, historic buildings.  Area was copiously  irrigated normal saline.  Toe was in an excellent  position.  There is no palpable prominence laterally.  Tourniquet was  inflated, hemostasis was obtained.  Toe pinked up nicely.  Skin was  closed with 4-0 nylon stitch.  Sterile dressing was applied.  Hard sole  shoe was applied.  Patient stable to PR.      Leonides Grills, M.D.  Electronically Signed     PB/MEDQ  D:  09/12/2007  T:  09/12/2007  Job:  [id number]

## 2011-04-02 NOTE — Cardiovascular Report (Signed)
[place]  Patient:    Toni Mcmillan, Toni Mcmillan                      MRN: [id number] Proc. Date: 11/24/99 Attending:  Daisey Must, M.D. [facility] CC:         Rosalyn Gess. Norins, M.D. [facility]             E. Graceann Congress, M.D. [facility]             Catheterization Lab                        Cardiac Catheterization  PROCEDURE PERFORMED:  Intravascular ultrasound of the right coronary artery, as  part of the reversal study protocol.  INDICATIONS:  Ms. Symonds is a 77 year old woman who underwent cardiac catheterization by Dr. Corinda Gubler.  She has significant hyperlipidemia. Catheterization revealed nonobstructive coronary artery disease.  She was enrolled in the reversal trial, and IVUS was performed as per protocol.  PROCEDURAL NOTE:  A pre-existing 6-French in the right femoral artery was exchanged over a wire for a 7-French sheath. Heparin was administered to achieve an ACT to greater than 200 sec.  A 7-French JR4 guide with side holes was utilized.  A high-torque floppy wire was advanced into the distal right coronary artery. Intracoronary nitroglycerin was administered prior to proceeding with the ultrasound.  A 3.2 Ultra-Cross intravascular ultrasound catheter was advanced over the wire into the distal right coronary artery.  Automatic pullback was performed, with recording of intravascular ultrasound images.  COMPLICATIONS:  The patient did have chest pain during the procedure, which was  relieved with nitroglycerin.  At the end of the procedure she was pain free. There were no other complications.  RESULTS:  Intravascular ultrasound images revealed mild, nonobstructive atherosclerotic disease of the proximal and mid right coronary artery.  At the conclusion of the procedure a Perclose vascular device was placed in the  right femoral artery with apparent hemostasis. DD:  11/24/99 TD:  11/24/99 Job: [id number]

## 2011-04-02 NOTE — Discharge Summary (Signed)
[place]  Patient:    Toni Mcmillan, Toni Mcmillan                      MRN: [id number] Adm. Date:  11/23/99 Disc. Date: 11/25/99 Attending:  Pricilla Riffle. Trisha Mangle, M.D. [facility] CC:         Juluis Mire, M.D.             Rosalyn Gess Norins, M.D. [facility]                           Discharge Summary  HISTORY OF PRESENT ILLNESS:  Toni Mcmillan is a 77 year old female, patient of Dr. Illene Regulus, who was admitted by Dr. Debby Bud with exertional chest pain. The patient reported significant shortness of breath with exertion, better with rest, associated with indigestion-type chest pain.  She had had recurrent episodes and was admitted for further evaluation.  HOSPITAL COURSE: #1 - CHEST PAIN:  The patient was seen in consultation by [facility] and Dr. Glennon Hamilton.  She underwent cardiac catheterization on November 24, 1999. Catheterization showed nonocclusive disease.  There was an approximately 50% right coronary occlusion.  The patient tolerated the catheterization well.  #2 - ABDOMINAL PAIN:  The patient has had episodic right lower quadrant pain. he underwent CT scan of the abdomen and pelvis.  There was a question of whether she had remaining ovaries, as she was status post hysterectomy.  There was some concern for the appearance of the right ovary, and a pelvic ultrasound was recommended. I discussed this with the patient, who feels that she could follow up as an outpatient with Dr. Richardean Chimera, her gynecologist.  She prefers to arrange this on her own.  FINAL DIAGNOSES: 1. Chest pain, felt to be noncardiac. 2. Abdominal pain with abnormal CT scan.  DISCHARGE MEDICATIONS: 1. Zocor 40 mg p.o. q.d. 2. Prilosec 20 mg p.o. q.d. 3. Enteric-coated aspirin 325 mg p.o. q.d.  DIET:  Low fat, low cholesterol.  FOLLOW-UP:  With Dr. Debby Bud in two to three weeks.  Follow up with Dr. Arelia Sneddon as soon as can be arranged, as well as a pelvic ultrasound to  evaluate the right ovary. DD:  11/25/99 TD:  11/25/99 Job: [id number]

## 2011-04-02 NOTE — Op Note (Signed)
[facility]  Patient:    Toni Mcmillan, Toni Mcmillan                    MRN: [id number] Adm. Date:  [id number] Disc. Date: [id number] Attending:  Frederich Balding                           Operative Report  PREOPERATIVE DIAGNOSIS:  Cystic enlargement of the adnexa, probably hydrosalpinx.  POSTOPERATIVE DIAGNOSIS:  Cystic enlargement of the adnexa, probably hydrosalpinx, with the finding of pelvic adhesive processes.  PROCEDURE:  Diagnostic laparoscopy.  SURGEON:  Juluis Mire, M.D.  ANESTHESIA:  General endotracheal.  ESTIMATED BLOOD LOSS:  Minimal.  PACKS AND DRAINS:  None.  INTRAOPERATIVE BLOOD REPLACEMENT:  None.  COMPLICATIONS:  None.  INDICATIONS:  Indications are dictated in history and physical.  DESCRIPTION OF PROCEDURE:  Patient was taken to the OR and placed in supine position.  After satisfactory level of general endotracheal anesthesia was obtained, the patient was placed in the dorsal lithotomy position using the Allen stirrups.  Dr. Maretta Bees. Vonita Moss then came in at this point in time and did cystoscopy with placement of bilateral ureteral catheters.  The patient was reprepped and draped.  A sponge on a sponge stick was placed in the vaginal vault.  Exam under anesthesia did reveal marked fixation of masses below the vaginal area; this was probably consistent with her previous ultrasound of ovarian findings.  A subumbilical incision was made.  The Veress needle was introduced into the abdominal cavity.  Abdomen was insufflated with 2 L of CO2.  The operating laparoscope was then introduced.  There was no evidence of injury to adjacent organs.  A 5-mm trocar was placed in the suprapubic area under direct visualization; again, no injury to adjacent organs.  A third trocar was put in place in the left lower quadrant after visualization of the epigastric vessels. At this point in time, visualization revealed the vaginal cuff to be  free.  Both ovaries were densely adherent to the posterior aspect of the vagina.  This formed one long ovarian complex extending from the right to left pelvic sidewall.  We could see the right tube; it was involved in a minor hydrosalpinx, but I think art of this dilation and cystic structure were seen as this long ovarian complex. his could not be removed laparoscopically due to its dense adherent nature to the posterior aspect of the vagina.  We could see both ureteral stents and the pelvic cavity was otherwise free.  There was not any other active process or concerns;  this process though did appear to be a benign process involving adhesions. Decision again was to not to try to remove this laparoscopically.  At this point in time, the abdomen was deflated of its CO2 and all trocars removed. Subumbilical incision was closed with interrupted subcuticular 4-0 Vicryls and the suprapubic incision was closed with Steri-Strips.  The sponge on a sponge stick was removed from the vaginal vault.  Both ureteral stents were removed intact, along with the urethral Foley.  The patient was taken out of the dorsal lithotomy position and  once extubated and alert, transferred to the recovery room in good condition. Sponge, instrument and needle count was reported as correct by the circulating nurse.DD:  03/18/00 TD:  03/21/00 Job: [id number]

## 2011-04-02 NOTE — Op Note (Signed)
NAME:  Toni Mcmillan, Toni Mcmillan                       ACCOUNT NO.:  [id number]   MEDICAL RECORD NO.:  [id number]                   PATIENT TYPE:  INP   LOCATION:  9312                                 FACILITY:  [facility]   PHYSICIAN:  Juluis Mire, M.D.                DATE OF BIRTH:  1931-06-13   DATE OF PROCEDURE:  11/28/2002  DATE OF DISCHARGE:                                 OPERATIVE REPORT   PREOPERATIVE DIAGNOSES:  1. Pelvic adhesions with ovarian entrapment and cyst formation.  2. Rectocele.   OPERATIVE PROCEDURE:  Exploratory laparotomy, extensive lysis of adhesions,  bilateral salpingo-oophorectomy, posterior colporrhaphy.   SURGEON:  Juluis Mire, M.D.   ASSISTANT:  Dineen Kid. Rana Snare, M.D.   ESTIMATED BLOOD LOSS:  200 cc.   PACKS AND DRAINS:  None except for a vaginal pack.   INTRAOPERATIVE BLOOD REPLACEMENT AND COMPLICATIONS:  None were noted.   INDICATIONS FOR PROCEDURE:  Indications are dictated in the History and  Physical.  At the time of preoperative evaluation the patient complained of  significant bulging from the vaginal area from the known rectocele.  She  stated she would like this surgically repaired at this time.  We had  discussed that she does have a cystocele and that could not be repaired at  this time without urological evaluation and consultation.  She was satisfied  with proceeding with the rectocele repair.  We had discussed the risks of  the procedure including further risk of infection, injury to the colon,  possible future breakdown or hematoma formation.  The patient professed  understanding of indications and risks as previously in the noted dictation.   PROCEDURE:  The patient was taken to the operating room and placed in the  supine position.  After satisfactory level of general endotracheal  anesthesia was obtained the patient was placed in the dorsal lithotomy  position using Allen stirrups.  The abdomen, perineum, and vagina were  prepped out  with Betadine.  A Foley was placed to straight drain.  A sponge  on a sponge stick was placed in the vaginal vault.  The patient was draped  out as a sterile field.  A low transverse skin incision was made with the  knife and carried through the subcutaneous tissue.  The fascia was then  sharply incised through the fascia and extended laterally.  The fascia was  taken off the muscles superiorly and inferiorly.  The rectus muscles were  separated in the midline.  The peritoneum was entered sharply and incision  of the peritoneum extended both superiorly and inferiorly.  The appendix was  visualized and noted to be retrocecal but unremarkable.  Upper abdomen  including the liver were smooth.  Both kidneys were of normal size and  shape.  I could not feel the gallbladder.  A Balfour retractor was put in  place and bowel contents were packed superiorly out of the  pelvic cavity.  Both ovaries were densely adherent to the posterior vaginal cuff and  enlarged with cystic enlargement.  We first went on the right side,  identified the right round ligament.  We incised that, developed the right  retroperitoneal space.  After identifying the ureter we isolated the ovarian  vasculature above it, clamped, cut, and Bovie ligated first with a free tie  of 0 Vicryl then a suture ligature of 0 Vicryl.  Next, the left  retroperitoneal space was identified, left ovarian vasculature was isolated,  clamped, cut, and doubly ligated first with a free tie of 0 Vicryl and then  with a suture ligature of 0 Vicryl.  Again, we could easily visualize the  course of the ureter.  Next, using sharp dissection, adhesions were taken  down.  We developed a bladder flap over the vaginal cuff and then using  sharp dissection, dissected the ovary free.  We used cautery to bring about  hemostasis.  Both ovaries were passed off the operative field.  There was no  active bleeding.  The cul-de-sac was actually shallow and we did not  do a  culdoplasty at this point in time.  We thoroughly irrigated the pelvis.  Hemostasis was excellent.  Urine output was clear and adequate.  The self-  retaining retractor and all packs were removed.  The muscles and the  peritoneum were closed in a running suture of 3-0 Vicryl, fascia closed in a  running suture of 0 PDS, skin was closed with staples and Steri-Strips.   The patient's legs were repositioned.  Under examination she had a moderate  cystocele and moderate rectocele.  Allis clamps were placed on the perineal  body and a section of skin was excised up to the introitus using the knife.  We then used scissors and sharp and blunt dissection, dissected the vaginal  mucosa from the underlying pubocervical fascia.  We then incised the vaginal  mucosa in the midline.  We reduced the rectocele with interrupted sutures of  2-0 Vicryl and rebuilt the perineal body.  The free edges of the vaginal  mucosa were then trimmed and reapproximated in the midline with interrupted  sutures of 2-0 Vicryl.  The perineal body was completely rebuilt with 2-0  Vicryl and the skin was closed with a running subcuticular of 4-0 Vicryl.  Rectal exam was unremarkable.  A vaginal pack was put in place.  We had good  support and no active bleeding.  The patient was taken out the dorsal  lithotomy position, once alert and extubated transferred to the recovery  room in good condition.  Sponge, instrument, and needle counts reported as  correct by circulating nurse x2.                                               Juluis Mire, M.D.    JSM/MEDQ  D:  11/28/2002  T:  11/28/2002  Job:  [id number]

## 2011-04-02 NOTE — H&P (Signed)
NAME:  Toni Mcmillan, OCONNELL NO.:  [id number]   MEDICAL RECORD NO.:  [id number]                   PATIENT TYPE:   LOCATION:                                       FACILITY:   PHYSICIAN:  Juluis Mire, M.D.                DATE OF BIRTH:   DATE OF ADMISSION:  DATE OF DISCHARGE:                                HISTORY & PHYSICAL   HISTORY OF PRESENT ILLNESS:  The patient is a 77 year old gravida 6, para 4,  abortus 2 postmenopausal white female who presents for exploratory  laparotomy with bilateral salpingo-oophorectomy.   In relation to the present admission, the patient had a total vaginal  hysterectomy with anterior and posterior repair done in the past.  She has  had trouble with persistent pelvic pain and discomfort.  Ultrasound  evaluations have revealed cystic enlargement at the vaginal cuff that was  felt to represent a hydrosalpinx.  Due to persistent discomfort and  ultrasound findings, she underwent diagnostic laparoscopy in 03/2000 with  findings of dense adherence of both ovaries to the vaginal cuff.  They  formed a long complex involving multiple cysts as well as hydrosalpinx.  This all looked to be a benign process.  At that point in time, we decided  to watch this conservatively.  She has continued to have pain and  discomfort, particularly in the right lower quadrant.  We have continued to  watch this with ultrasound.  It has remained basically unchanged.  However,  in view of persistent discomfort, the patient now presents for definitive  therapy in the form of bilateral salpingo-oophorectomy.   ALLERGIES:  NO KNOWN DRUG ALLERGIES.   MEDICATIONS:  1. Aspirin.  2. Altace 5 mg.  3. Zocor 20 mg.  4. Triamcinolone 110 mg.  5. Metoprolol 100 mg.  6. Multivitamins.  7. Acetonide.   PAST MEDICAL HISTORY:  She does have a history of hypertension and  hypercholesterolemia.  Followed by Dr. Debby Bud at [facility].  She  seems to be  well-controlled.  Also history of diverticulitis followed by Dr.  Laural Benes here in town.  Otherwise, usual childhood diseases.   PAST SURGICAL HISTORY:  She had a prior total vaginal hysterectomy with  anterior and posterior repair done in 1972.  She has had her tonsils  removed.  She has had a previous D&C.  She has had previous vein ligations.   OBSTETRICAL HISTORY:  She has had four spontaneous vaginal deliveries and  two miscarriages.   FAMILY HISTORY:  Basically noncontributory.   SOCIAL HISTORY:  No tobacco or alcohol use.   REVIEW OF SYSTEMS:  Noncontributory.   PHYSICAL EXAMINATION:  VITAL SIGNS:  The patient is afebrile with stable  vital signs.  HEENT:  Patient normocephalic.  Pupils equal, round, reactive to light and  accommodation.  Extraocular movements are intact.  Sclerae and conjunctivae  clear.  Oropharynx clear.  NECK:  Without thyromegaly.  BREASTS:  No discrete masses.  LUNGS:  Clear.  CARDIAC:  Regular rate and rhythm without murmurs, rubs, or gallops.  ABDOMEN:  Benign.  No masses, organomegaly, or tenderness.  PELVIC:  Normal external genitalia.  Vaginal mucosa is clear.  Cuff intact.  Bimanual exam reveals fairly significant fullness at the top of the cuff  with tenderness.  Adnexa otherwise unremarkable.  Rectovaginal exam is  clear.  EXTREMITIES:  Trace edema.  NEUROLOGIC:  Grossly within normal limits.   IMPRESSION:  1. Entrapment of ovaries with formation of cystic masses at the vaginal     cuff.  This is believed to increase pelvic and abdominal discomfort.  2. Hypertension.  3. Hypercholesterolemia.   PLAN:  The patient will undergo exploratory surgery with bilateral salpingo-  oophorectomy.  The risks of surgery have been discussed, including the risk  of infection.  The risk of hemorrhage could necessitate transfusion.  The  risk of injury to adjacent organs such as bladder, bowel, or ureters that  could require further exploratory surgery.   The risks of deep venous  thrombosis and pulmonary embolus.  The patient expressed understanding of  the indications and risks.                                               Juluis Mire, M.D.    JSM/MEDQ  D:  11/28/2002  T:  11/28/2002  Job:  [id number]

## 2011-04-02 NOTE — Assessment & Plan Note (Signed)
[facility]                           PRIMARY CARE OFFICE NOTE   Mcmillan, Toni                    MRN:          [id number]  DATE:12/29/2006                            DOB:          02/13/1931    Problem 1.  [name] is a very delightful 77 year old woman soon to be  49, who presents for evaluation.  She was last seen in the office on  February 10, 2006.  In the interval, she has been evaluated by [facility] Skye.  Hart Rochester, M.D. in regards to varicose veins and at this time is not a  candidate for any intervention due to prior surgeries.  She is now using  support stockings on an as-needed basis.   Problem 2.  Foot pain.  The patient was seen and evaluated by Leonides Grills, M.D. who diagnosed tendinitis.  The patient underwent a course  of physical therapy and is doing much better in regards to weightbearing  and foot pain.   Problem 3.  Left knee pain.  The patient had an exacerbation of pain in  her left knee in September.  She was referred to [facility] Skye. Artis Flock, M.D. in  December of 4098 for steroid injection.  She has had a very good result  with markedly decreased pain and discomfort.   Problem 4.  GI.  The patient ate out at a [place] and developed  bloody diarrhea.  She was subsequently seen by Danise Edge, M.D. and  came to colonoscopy in November of 2007.  She had one small sessile 2 mm  polyp and otherwise an unremarkable examination in regards to any cause  of her lower GI bleeding.  Pathology report was not available to me.   Problem 5.  Ophthalmic.  The patient has been referred by Richarda Overlie, M.D. and Dr. Gwendalyn Ege, who is at [facility] who comes to the  [place] office once a week.  She has some type of vitreal edema.  There is a question of whether this is related to diabetes.  She does  take drops on a regular basis.   PAST MEDICAL HISTORY:  Well documented in my note of September 20, 2005,  without significant  change except the additions as above.   SOCIAL HISTORY:  Unchanged with the patient now being married 55 years  with two daughters and a son, six grandchildren with the seventh already  delivered.  She has two great grandchildren as well.  She did have a  nice trip to [place] two years ago.   PHYSICIAN ROSTER:  1. Juluis Mire, M.D. for GYN.  2. Danise Edge, M.D. for GI.  3. Nanetta Batty, M.D. for cardiology.  4. Dr. Gwendalyn Ege for ophthalmology.   CURRENT MEDICATIONS:  1. Altace 2.5 mg daily.  2. Aspirin 81 mg daily.  3. Metoprolol 75 mg b.i.d.  4. Vytorin 10/80 once daily.  5. Calcium 1200 mg b.i.d.  6. Multivitamins daily.  7. Vitamin C daily.  8. Omega 3 2000 mg daily.   CHART REVIEW:  Colonoscopy report as noted.  I have no notes from  orthopedics.  The patient had laboratory in our office on September 13, 2005, which did show a glucose of 112.  No other recent studies are  available.   REVIEW OF SYSTEMS:  The patient has had no fevers, sweats, or chills.  She has been followed by Dr. Gwendalyn Ege as noted on a regular every-58-month  basis.  No cardiovascular, respiratory, GI, or GU complaints.  Musculoskeletal as above.   PHYSICAL EXAMINATION:  VITAL SIGNS:  Temperature 98.2, blood pressure  151/81, pulse 60, weight 146.  GENERAL:  A well-developed well-nourished woman looking younger than her  stated chronologic age of 17 and in no acute distress.  HEENT:  Normocephalic and atraumatic.  EAT's with dried cerumen.  TM's  were not visualized.  Oropharynx with native dentition in good repair.  No buccal or palatal lesions were noted.  Posterior pharynx was clear.  Conjunctivae and sclerae clear.  Pupils equal, round, and reactive to  light.  Further examination deferred to Dr. Gwendalyn Ege.  NECK:  Supple without thyromegaly.  There was no nodule or other  abnormality palpable on the thyroid glands.  NODES:  No adenopathy was noted in the cervical or supraclavicular  regions.   CHEST:  No CVA tenderness.  Lungs were clear to auscultation and  percussion.  BREASTS:  Deferred to gynecology.  HEART:  2+ radial pulse, no JVD, and no carotid bruit.  She had quiet  precordium with regular rate and rhythm and without murmurs, rubs, or  gallops.  ABDOMEN:  Nontender.  PELVIC:  RECTAL:  Deferred to gynecology.  EXTREMITIES:  Without cyanosis, clubbing, or edema or deformity.  The  patient is noted to have varicose veins that I would grade as mild in  her distal lower extremities.   LABORATORY DATA:  Ordered and pending will be serum calcium, 2-hour  Glucose Tolerance Test, hemoglobin A1C and CBC with differential.   ASSESSMENT:  1. Hypertension.  The patient's blood pressure is mildly elevated at      today's visit.  I am reviewing her past chart.  She has had      variable blood pressure, generally well controlled.  Plan; the      patient is to continue on her present medications.  I would      recommend follow-up blood pressure checks at a location of her      choice and if she continues to have a systolic greater than 140, I      would recommend increasing her Altace to 5 mg daily.  2. Cardiovascular.  The patient is followed on a regular basis by Dr.      Allyson Sabal and she has an upcoming appointment.  3. Lipids.  The patient's lipids are all followed by Dr. Allyson Sabal.  In      our office her last lipid panel dates from September 13, 2005, and      she had an excellent LDL of 79 with an HDL of 42.8 on her present      medical regimen.  I have asked that laboratories be forwarded to      me.  4. Ophthalmic.  The patient is currently stable by seeing Dr. Gwendalyn Ege.      We will pursue an aggressive evaluation for possible diabetes with      lab work as outlined above.  5. Orthopedic.  The patient is currently stable.  6. Health maintenance.  The patient has had colonoscopy as noted and      would  be a candidate for follow-up in 3-5 years and will defer this     to Dr.  Danise Edge.  The patient will be having a mammogram      breast examination with Dr. Arelia Sneddon and I have asked that a      mammogram report be forwarded to me.  The patient otherwise is      current and up-to-date with health maintenance.  The patient did      have a pneumonia vaccine in 2004.  She may be a candidate for      Zostavax and she needs to check with her insurance company in      regards to coverage and we would be happy to accommodate her if she      has adequate insurance coverage, otherwise, I would refer her to      the health department for a better price on immunization.   In summary, this is a very pleasant woman who seems to be medically  stable at this time.  She will return to see me on an as-needed basis.     Rosalyn Gess Norins, MD  Electronically Signed    MEN/MedQ  DD: 12/29/2006  DT: 12/29/2006  Job #: [id number]   cc:   Juluis Mire, M.D.  Danise Edge, M.D.  Nanetta Batty, M.D.  Albertha Ghee. Gwendalyn Ege, MD at [facility]

## 2011-04-02 NOTE — Discharge Summary (Signed)
   Toni Mcmillan, Toni Mcmillan                       ACCOUNT NO.:  [id number]   MEDICAL RECORD NO.:  [id number]                   PATIENT TYPE:  INP   LOCATION:  9312                                 FACILITY:  [facility]   PHYSICIAN:  Juluis Mire, M.D.                DATE OF BIRTH:  December 27, 1930   DATE OF ADMISSION:  11/28/2002  DATE OF DISCHARGE:  11/30/2002                                 DISCHARGE SUMMARY   ADMITTING DIAGNOSES:  1. Ovarian entrapment with cyst formation.  2. Rectocele.   DISCHARGE DIAGNOSES:  1. Ovarian entrapment with cyst formation.  2. Rectocele.   OPERATIVE PROCEDURE:  1. Exploratory laparotomy with bilateral salpingo-oophorectomy.  2. Posterior colporrhaphy.   HISTORY AND PHYSICAL:  For complete history and physical, please see  dictated note.   HOSPITAL COURSE:  The patient underwent above noted surgery.  Did have  numerous pelvic adhesions that were managed at the time.  Postoperatively  she did well.  On her first postoperative day a vaginal pack was removed as  well as the Foley.  She did void without difficulty.  Her diets were  advanced.  She did have a low grade fever that subsequently resolved and was  felt to be secondary to atelectasis.  Her postoperative hemoglobin was 9.9.  On her second postoperative day she was afebrile with stable vital signs.  Abdomen was soft and nontender.  Bowel sounds were active and she was  passing flatus.  She was voiding without difficulty.  She had no active  vaginal bleeding.  The low transverse incision was intact and bowel sounds  were active.   In terms of complications, none were encountered during stay in hospital.  The patient discharged home in stable condition.   DISPOSITION:  The patient is instructed in postoperative management.  She is  to avoid heavy lifting, vaginal entrance, or driving of a car.  She will use  Tylox as needed for pain.  She will call with signs of infection, nausea,  vomiting,  increasing abdominal pain, or active vaginal bleeding.  Follow-up  in the office will be in one week.  She will presume all preoperative  medications.                                               Juluis Mire, M.D.    JSM/MEDQ  D:  11/30/2002  T:  11/30/2002  Job:  [id number]

## 2011-04-02 NOTE — Cardiovascular Report (Signed)
[place]  Patient:    Toni Mcmillan                     MRN: [id number] Proc. Date: 11/24/99 Adm. Date:  [id number] Attending:  Duke Salvia CC:         Rosalyn Gess. Norins, M.D. [facility]             Cardiac Catheterization Laboratory                        Cardiac Catheterization  PROCEDURE:  Selective coronary angiography, left ventricular angiography Judkins technique.  CARDIOLOGIST:  Cecil Cranker, M.D.  INDICATIONS:  The patient is a 76 year old white female with a history suggesting unstable angina, with multiple risk factors.  RESULTS: PRESSURES: LV systolic 180, diastolic 18. AO systolic 180, diastolic 86.  ANGIOGRAPHY: 1. Left main coronary artery:  Is normal. 2. Left anterior descending coronary artery:  Reveals significant calcium    proximally mostly before the first septal, also a mild amount of calcium    just after the first septal and diagonal.  There is a 50% segmental stenosis    after the first septal and diagonal. 3. Circumflex coronary artery:  Is normal. 4. Right coronary artery:  Reveals 20%-30% segmental stenosis in the mid-RCA. 5. Left ventricle:  Is normal.  ABDOMINAL AORTOGRAM:  Reveals normal renal arteries, normal abdominal aorta.  SUMMARY: 1. Normal left main coronary artery. 2. Calcium and 50% lesion in the proximal left anterior descending coronary artery. 3. Normal circumflex coronary artery. 4. A 20%-30% segmental stenosis in the mid-right coronary artery. 5. Normal left ventricle. 6. Normal renal arteries. 7. Normal abdominal aortogram.  PLAN:  The patient will be treated with a medical regimen.  She has consented to the REVERSAL study and is undergoing IVUS study per Dr. Loraine Leriche Pulsipher. DD:  11/24/99 TD:  11/24/99 Job: [id number]

## 2011-04-02 NOTE — Op Note (Signed)
[facility]  Patient:    Toni Mcmillan, Toni Mcmillan                    MRN: [id number] Proc. Date: 03/18/00 Adm. Date:  [id number] Disc. Date: [id number] Attending:  Frederich Balding CC:         Juluis Mire, M.D.             Rosalyn Gess Norins, M.D. [facility]                           Operative Report  PREOPERATIVE DIAGNOSES:  History of microhematuria and preop bilateral oophorectomy.  POSTOPERATIVE DIAGNOSES:  History of microhematuria and preop bilateral oophorectomy.  PROCEDURE:  Cystoscopy, bilateral retrograde pyelograms with interpretation and  placement of bilateral ureteral catheters.  SURGEON:  Maretta Bees. Vonita Moss, M.D.  ANESTHESIA:  General.  INDICATIONS:  This 77 year old lady is undergoing laparoscopic oophorectomy. Dr. Juluis Mire wanted ureteral catheters placed at the time of surgery.  In  addition, this lady has had chronic microhematuria and she has had previous cystoscopy and renal ultrasounds that have been unremarkable and she is brought to the OR today for cystoscopy, retrograde pyelogram, possible bladder biopsy and insertion of double-J catheters.  DESCRIPTION OF PROCEDURE:  The patient was brought to the operating room and placed in lithotomy position.  External genitalia were prepped and draped in usual fashion.  She was cystoscoped and the bladder had no stones, tumors or inflammatory lesions.  A #5-French whistle-tip ureteral catheter with red markings was inserted into the right ureteral orifice and injection of radio-opaque contrast revealed  normal ureter with no obstruction or filling defects and a normal pyelocalyceal  system.  Through the other working port, I inserted a 5-French whistle-tip ureteral catheter with blue markings and again performed a left retrograde pyelogram that was unremarkable and no stones, tumors or filling defects or obstruction.  The bladder was emptied, the scope removed and the ureteral  catheters left in position with the tips in the renal pelves bilaterally.  Foley catheter was inserted and  connected to closed drainage and the ureteral catheters were connected to the same combined closed drainage system.  The patient tolerated the procedure well. DD:  03/18/00 TD:  03/21/00 Job: [id number]

## 2011-05-07 ENCOUNTER — Encounter: Payer: Self-pay | Admitting: Internal Medicine

## 2011-05-10 ENCOUNTER — Other Ambulatory Visit (INDEPENDENT_AMBULATORY_CARE_PROVIDER_SITE_OTHER): Payer: Medicare Other

## 2011-05-10 ENCOUNTER — Ambulatory Visit (INDEPENDENT_AMBULATORY_CARE_PROVIDER_SITE_OTHER): Payer: Medicare Other | Admitting: Internal Medicine

## 2011-05-10 ENCOUNTER — Other Ambulatory Visit: Payer: Self-pay | Admitting: Internal Medicine

## 2011-05-10 VITALS — BP 128/70 | HR 54 | Temp 96.4°F | Resp 14 | Ht 61.5 in | Wt 126.8 lb

## 2011-05-10 DIAGNOSIS — E785 Hyperlipidemia, unspecified: Secondary | ICD-10-CM

## 2011-05-10 DIAGNOSIS — Z0000 Encounter for general adult medical examination without abnormal findings: Secondary | ICD-10-CM

## 2011-05-10 DIAGNOSIS — Z136 Encounter for screening for cardiovascular disorders: Secondary | ICD-10-CM

## 2011-05-10 DIAGNOSIS — G4700 Insomnia, unspecified: Secondary | ICD-10-CM

## 2011-05-10 DIAGNOSIS — E119 Type 2 diabetes mellitus without complications: Secondary | ICD-10-CM

## 2011-05-10 DIAGNOSIS — I10 Essential (primary) hypertension: Secondary | ICD-10-CM

## 2011-05-10 LAB — HEPATIC FUNCTION PANEL
ALT: 18 U/L (ref 0–35)
Alkaline Phosphatase: 57 U/L (ref 39–117)
Bilirubin, Direct: 0.1 mg/dL (ref 0.0–0.3)
Total Bilirubin: 0.4 mg/dL (ref 0.3–1.2)
Total Protein: 7.1 g/dL (ref 6.0–8.3)

## 2011-05-10 LAB — LIPID PANEL
Cholesterol: 128 mg/dL (ref 0–200)
HDL: 60.3 mg/dL (ref >39.00)
VLDL: 18.4 mg/dL (ref 0.0–40.0)

## 2011-05-10 LAB — CBC WITH DIFFERENTIAL/PLATELET
Basophils Relative: 0.3 % (ref 0.0–3.0)
Hemoglobin: 12 g/dL (ref 12.0–15.0)
Lymphs Abs: 2.8 10*3/uL (ref 0.7–4.0)
MCHC: 33 g/dL (ref 30.0–36.0)
MCV: 91.9 fl (ref 78.0–100.0)
Monocytes Absolute: 0.6 10*3/uL (ref 0.1–1.0)
Monocytes Relative: 7.1 % (ref 3.0–12.0)
Neutro Abs: 5 10*3/uL (ref 1.4–7.7)

## 2011-05-10 LAB — COMPREHENSIVE METABOLIC PANEL
ALT: 18 U/L (ref 0–35)
AST: 24 U/L (ref 0–37)
Alkaline Phosphatase: 57 U/L (ref 39–117)
Calcium: 9.2 mg/dL (ref 8.4–10.5)
GFR: 102.72 mL/min (ref >60.00)
Glucose, Bld: 99 mg/dL (ref 70–99)
Total Bilirubin: 0.4 mg/dL (ref 0.3–1.2)

## 2011-05-10 MED ORDER — EZETIMIBE-SIMVASTATIN 10-80 MG PO TABS: 1.0000 | ORAL_TABLET | Freq: Every day | ORAL | Status: DC

## 2011-05-10 MED ORDER — ZOLPIDEM TARTRATE 10 MG PO TABS: 10.0000 mg | ORAL_TABLET | Freq: Every evening | ORAL | Status: DC | PRN

## 2011-05-10 MED ORDER — RAMIPRIL 1.25 MG PO CAPS: 1.2500 mg | ORAL_CAPSULE | Freq: Every day | ORAL | Status: DC

## 2011-05-10 NOTE — Progress Notes (Signed)
Subjective:    Patient ID: Toni Mcmillan, female    DOB: August 02, 1931, 77 y.o.   MRN: [id number]  HPI The patient is here for annual Medicare wellness examination and management of other chronic and acute problems. In the interval she has had eye surgery/laser which has restored some of the vision in the right eye. She has had some back pain from overuse in the garden. She did see Dr. Melrose Nakayama for orthopedics and she had steroid injections in both knees (she did have some facial swelling as a reaction). She is not yet at a point where she is a candidate for TKR. She has seen her gynecologist and had normal exam and breast exam. She has had mammography.   The risk factors are reflected in the social history.  The roster of all physicians providing medical care to patient - is listed in the Snapshot section of the chart.  Activities of daily living:  The patient is 100% inedpendent in all ADLs: dressing, toileting, feeding as well as independent mobility  Home safety : The patient has smoke detectors in the home. They wear seatbelts.  Firearms are present in the home, kept in a safe fashion. There is no violence in the home.   There is no risks for hepatitis, STDs or HIV. There is no   history of blood transfusion. They have no travel history to infectious disease endemic areas of the world.  The patient has seen their dentist in the last six month. They have seen their eye doctor in the last year. They admit to hearing difficulty AS but have not had audiologic testing in the last year.  They do not  have excessive sun exposure. Discussed the need for sun protection: hats, long sleeves and use of sunscreen if there is significant sun exposure.   Diet: the importance of a healthy diet is discussed. They do have a healthy diet.  The patient does not have a regular exercise program.  The benefits of regular aerobic exercise were discussed.  Depression screen: there are no signs or vegative symptoms  of depression- irritability, change in appetite, anhedonia, sadness/tearfullness.  Cognitive assessment: the patient manages all their financial and personal affairs and is actively engaged. They could relate day,date,year and events; recalled 3/3 objects at 3 minutes; performed clock-face test normally.  The following portions of the patient's history were reviewed and updated as appropriate: allergies, current medications, past family history, past medical history,  past surgical history, past social history  and problem list.  Vision, hearing, body mass index were assessed and reviewed.   During the course of the visit the patient was educated and counseled about appropriate screening and preventive services including : fall prevention , diabetes screening, nutrition counseling, colorectal cancer screening, and recommended immunizations.  Past Medical History  Diagnosis Date  . Unspecified essential hypertension   . Allergic rhinitis, cause unspecified   . Personal history of other diseases of digestive system   . Disturbances of sensation of smell and taste   . Insomnia, unspecified   . Unspecified sinusitis (chronic)   . Other and unspecified hyperlipidemia   . Headache   . Whooping cough, unspecified organism   . Scarlet fever    Past Surgical History  Procedure Date  . Bilateral oophorectomy   . Cystoscopy     w/ stenting of both ureters  . Hydrosalpinx diagnostic laparoscopy   . Correction of hammertoe deformity   . Vericose vein surgery   . Cataract extraction,  bilateral   . Abdominal hysterectomy   . Hemorrhoid surgery   . Tonsillectomy age 65   Family History  Problem Relation Age of Onset  . Cancer Mother     ?  . Diabetes Mother   . Other Mother     coagulopathy/ ruptured viscus  . Hypertension Father   . Hyperlipidemia Father     ?  Marland Kitchen Other Father     CVA  . Cancer Paternal Grandmother     breast   History   Social History  . Marital Status: Married     Spouse Name: N/A    Number of Children: N/A  . Years of Education: N/A   Occupational History  . Not on file.   Social History Main Topics  . Smoking status: Not on file  . Smokeless tobacco: Not on file  . Alcohol Use:   . Drug Use:   . Sexually Active:    Other Topics Concern  . 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 No rigors, activity change and unexpected weight change.  HEENT:  Positive for mild subjective hearing loss. No  ear pain, congestion, neck stiffness and postnasal drip. Negative for sore throat or swallowing problems. Negative for dental complaints.   Eyes: Loss of vision after cataract surgery OD, acute intraocular pressure with "stroke" and vision loss - better after laser treatment.  OS is normal Respiratory: Negative for chest tightness and wheezing.   Cardiovascular: Negative for chest pain. Rare palpitations. No decreased exercise tolerance Gastrointestinal: No change in bowel habit,but she had an isolated episode of encoporesis. No bloating or gas. No reflux or indigestion Genitourinary: Negative for urgency, frequency, flank pain and difficulty urinating.  Musculoskeletal: Recurrent back pain. Chronic knee pain - OA, slows her down. Neurological: Negative for dizziness, tremors, weakness and headaches.  Hematological: Negative for adenopathy.  Psychiatric/Behavioral: Negative for behavioral problems and dysphoric mood.       Objective:   Physical Exam Vitals reviewed. Gen'l: well nourished, well developed white woman in no distress HEENT - [place]/AT, EACs/TMs normal, oropharynx with native dentition in good condition, no buccal or palatal lesions, posterior pharynx clear,  mucous membranes moist. C&S clear, PERRLA, difficult to visualize fund OD but no gross abnormality noted, fundi OS appears normal. Neck - supple, no thyromegaly Nodes- negative submental, cervical, supraclavicular regions Chest - no deformity, no CVAT Lungs - cleat without rales, wheezes. No increased work of breathing Breast - deferred to gyn Cardiovascular - regular rate and rhythm, quiet precordium, no murmurs, rubs or gallops, 2+ radial, DP and PT pulses Abdomen - BS+ x 4, no HSM, no guarding or rebound or tenderness Pelvic - deferred to gyn Rectal - deferred to gyn Extremities - no clubbing, cyanosis, edema or deformity.  Neuro - A&O x 3, CN II-XII normal, motor strength normal and equal, DTRs 2+ and symmetrical biceps, radial, and patellar tendons. Cerebellar - no tremor, no rigidity, fluid movement and normal gait. Good 3 word recall, normal clock-face exercise Derm - Head, neck, back, abdomen and extremities without suspicious lesions  Lab Results  Component Value Date   WBC 8.7 05/10/2011   HGB 12.0 05/10/2011   HCT 36.2 05/10/2011   PLT 298.0  05/10/2011   CHOL 128 05/10/2011   TRIG 92.0 05/10/2011   HDL 60.30 05/10/2011   ALT 18 05/10/2011   ALT 18 05/10/2011   AST 24 05/10/2011   AST 24 05/10/2011   NA 138 05/10/2011   K 4.2 05/10/2011   CL 103 05/10/2011   CREATININE 0.6 05/10/2011   BUN 13 05/10/2011   CO2 29 05/10/2011   TSH 0.76 05/10/2011   HGBA1C 6.8* 05/10/2011   Lab Results  Component Value Date   LDLCALC 49 05/10/2011           Assessment & Plan:

## 2011-05-11 DIAGNOSIS — Z0000 Encounter for general adult medical examination without abnormal findings: Secondary | ICD-10-CM | POA: Insufficient documentation

## 2011-05-11 NOTE — Assessment & Plan Note (Signed)
Doing well with no complaints of low blood sugars. A1C 6.8% is at goal.  Plan - continue present regimen - life-style management w/o medication.

## 2011-05-11 NOTE — Assessment & Plan Note (Signed)
BP Readings from Last 3 Encounters:  05/10/11 128/70  05/08/10 110/62  05/05/09 128/72   Very good control of blood pressure on present medications.  Plan - continue present medicines - refill provided.

## 2011-05-11 NOTE — Assessment & Plan Note (Signed)
Interval history is significant for laser surgery of the right eye with improved vision otherwise unremarkable. Physical exam is normal. Lab results are in normal range and reflect good control of chronic problems. She is current with Gyn and breast health. She is current for colorectal cancer screening with last exam Nov '07 and due this fall for follow-up. Immunizations: Tetanus - today; pneumonia vaccine Oct '04; shingles June '08. 12 lead EKG reveals aq sinus bradycardia but no signs of ischemia or injury  In summary - a very nice woman who appears to be medically stable with good control of her chronic ailments. She remains vivacious, active and energetic. She will return as needed or in 1 year.

## 2011-05-11 NOTE — Assessment & Plan Note (Signed)
She voices no complaint at this time and is getting good results from zolpidem 10mg  qhs. No adverse effects.   Plan - Rx renewed.

## 2011-05-11 NOTE — Assessment & Plan Note (Signed)
Excellent control on present medication with LDL 49!  Plan - continue present medication

## 2011-05-13 ENCOUNTER — Encounter: Payer: Self-pay | Admitting: Internal Medicine

## 2011-06-14 ENCOUNTER — Other Ambulatory Visit: Payer: Self-pay | Admitting: Internal Medicine

## 2011-06-28 ENCOUNTER — Telehealth: Payer: Self-pay | Admitting: *Deleted

## 2011-06-28 NOTE — Telephone Encounter (Signed)
Pt worried about possible fungus in both ring fingers. Advised OV for eval and scheduled for Thursday.

## 2011-07-01 ENCOUNTER — Ambulatory Visit (INDEPENDENT_AMBULATORY_CARE_PROVIDER_SITE_OTHER): Payer: Medicare Other | Admitting: Internal Medicine

## 2011-07-01 VITALS — BP 122/70 | HR 53 | Temp 97.4°F | Wt 129.0 lb

## 2011-07-01 DIAGNOSIS — L608 Other nail disorders: Secondary | ICD-10-CM

## 2011-07-01 DIAGNOSIS — L609 Nail disorder, unspecified: Secondary | ICD-10-CM

## 2011-07-04 NOTE — Progress Notes (Signed)
  Subjective:    Patient ID: Toni Mcmillan, female    DOB: 31-Jan-1931, 77 y.o.   MRN: [id number]  HPI Toni Mcmillan resents to discuss and have evaluated a change in fingernails. She has developed white color change to distal nail edge. NO discomfort, nail is not loose. She has had no new exposures. She is in good health  I have reviewed the patient's medical history in detail and updated the computerized patient record.    Review of Systems System review is negative for any constitutional, cardiac, pulmonary, GI or neuro symptoms or complaints     Objective:   Physical Exam Vitals - reviewed and stable Gen'l - WNWD white woman in no distress Derm - distal edge of several fingernails are white in appearance. The nail bed is normal. The nail is not thickened.       Assessment & Plan:  Fingernail change - reviewed [employer], google images - patient's presentation is c/w Reedy nails -a benign color change of no pathologic significance  Plan - patient reassured           For progressive change or nail thickening - return

## 2011-08-25 LAB — BASIC METABOLIC PANEL
BUN: 12
CO2: 30
Chloride: 107
Creatinine, Ser: 0.64
GFR calc Af Amer: >60
Potassium: 4.7

## 2011-08-25 LAB — POCT HEMOGLOBIN-HEMACUE
Hemoglobin: 12.5
Operator id: 116011

## 2011-09-23 DIAGNOSIS — H3581 Retinal edema: Secondary | ICD-10-CM

## 2011-09-23 DIAGNOSIS — E11311 Type 2 diabetes mellitus with unspecified diabetic retinopathy with macular edema: Secondary | ICD-10-CM | POA: Insufficient documentation

## 2011-09-23 DIAGNOSIS — E113299 Type 2 diabetes mellitus with mild nonproliferative diabetic retinopathy without macular edema, unspecified eye: Secondary | ICD-10-CM | POA: Insufficient documentation

## 2011-09-29 ENCOUNTER — Ambulatory Visit (INDEPENDENT_AMBULATORY_CARE_PROVIDER_SITE_OTHER): Payer: Medicare Other

## 2011-09-29 DIAGNOSIS — Z23 Encounter for immunization: Secondary | ICD-10-CM

## 2011-10-12 ENCOUNTER — Other Ambulatory Visit: Payer: Self-pay | Admitting: Gastroenterology

## 2011-11-23 ENCOUNTER — Telehealth: Payer: Self-pay | Admitting: *Deleted

## 2011-11-23 MED ORDER — AZITHROMYCIN 250 MG PO TABS: ORAL_TABLET | ORAL | Status: AC

## 2011-11-23 NOTE — Telephone Encounter (Signed)
Very likely to be a viral infection. However, ok for z-pak plus routine supportive care.

## 2011-11-23 NOTE — Telephone Encounter (Signed)
Patient informed. Rx Done. 

## 2011-11-23 NOTE — Telephone Encounter (Signed)
Pt c/o head & chest cold w/cough x2 wks--only has fever in evening [around 100.4], no pain, no color to sinus mucus, sputum white w/foam. Pt request Rx; please advise.

## 2011-12-16 ENCOUNTER — Other Ambulatory Visit: Payer: Self-pay

## 2011-12-16 MED ORDER — METOPROLOL TARTRATE 50 MG PO TABS: 75.0000 mg | ORAL_TABLET | Freq: Two times a day (BID) | ORAL | Status: DC

## 2011-12-21 ENCOUNTER — Other Ambulatory Visit: Payer: Self-pay | Admitting: Internal Medicine

## 2012-02-24 ENCOUNTER — Other Ambulatory Visit: Payer: Self-pay | Admitting: *Deleted

## 2012-02-24 ENCOUNTER — Other Ambulatory Visit: Payer: Self-pay | Admitting: Internal Medicine

## 2012-02-24 NOTE — Telephone Encounter (Signed)
MD is out of office. Is this ok to refill?,,,02/24/12@10 :34am/LMB

## 2012-02-24 NOTE — Telephone Encounter (Signed)
Called rx into pharmacy spoke with guy/pharmacist. Epic updated... 02/24/12@11 :03am/LMB

## 2012-02-24 NOTE — Telephone Encounter (Signed)
Faxed Alprazolam Rx to pharmacy/[facility]

## 2012-03-02 ENCOUNTER — Other Ambulatory Visit: Payer: Self-pay | Admitting: Internal Medicine

## 2012-03-08 ENCOUNTER — Other Ambulatory Visit: Payer: Self-pay | Admitting: Internal Medicine

## 2012-04-20 ENCOUNTER — Telehealth: Payer: Self-pay | Admitting: Internal Medicine

## 2012-04-20 NOTE — Telephone Encounter (Signed)
Forward to Dr. Illene Regulus for review.

## 2012-05-10 ENCOUNTER — Encounter: Payer: Medicare Other | Admitting: Internal Medicine

## 2012-06-06 ENCOUNTER — Other Ambulatory Visit: Payer: Self-pay | Admitting: Internal Medicine

## 2012-06-08 ENCOUNTER — Other Ambulatory Visit (INDEPENDENT_AMBULATORY_CARE_PROVIDER_SITE_OTHER): Payer: Medicare Other

## 2012-06-08 ENCOUNTER — Ambulatory Visit (INDEPENDENT_AMBULATORY_CARE_PROVIDER_SITE_OTHER): Payer: Medicare Other | Admitting: Internal Medicine

## 2012-06-08 ENCOUNTER — Encounter: Payer: Self-pay | Admitting: Internal Medicine

## 2012-06-08 VITALS — BP 132/80 | HR 60 | Temp 97.6°F | Resp 16 | Wt 127.0 lb

## 2012-06-08 DIAGNOSIS — Z0000 Encounter for general adult medical examination without abnormal findings: Secondary | ICD-10-CM

## 2012-06-08 DIAGNOSIS — E785 Hyperlipidemia, unspecified: Secondary | ICD-10-CM

## 2012-06-08 DIAGNOSIS — I10 Essential (primary) hypertension: Secondary | ICD-10-CM

## 2012-06-08 DIAGNOSIS — E119 Type 2 diabetes mellitus without complications: Secondary | ICD-10-CM

## 2012-06-08 DIAGNOSIS — Z23 Encounter for immunization: Secondary | ICD-10-CM | POA: Insufficient documentation

## 2012-06-08 LAB — LIPID PANEL
Cholesterol: 107 mg/dL (ref 0–200)
LDL Cholesterol: 30 mg/dL (ref 0–99)
Total CHOL/HDL Ratio: 2

## 2012-06-08 LAB — COMPREHENSIVE METABOLIC PANEL
ALT: 14 U/L (ref 0–35)
AST: 22 U/L (ref 0–37)
Albumin: 4.1 g/dL (ref 3.5–5.2)
BUN: 22 mg/dL (ref 6–23)
CO2: 29 mEq/L (ref 19–32)
Calcium: 9.8 mg/dL (ref 8.4–10.5)
Chloride: 102 mEq/L (ref 96–112)
GFR: 96.83 mL/min (ref >60.00)
Potassium: 4.7 mEq/L (ref 3.5–5.1)

## 2012-06-08 LAB — HEPATIC FUNCTION PANEL
ALT: 14 U/L (ref 0–35)
Total Protein: 7.4 g/dL (ref 6.0–8.3)

## 2012-06-08 LAB — HEMOGLOBIN A1C: Hgb A1c MFr Bld: 6.8 % — ABNORMAL HIGH (ref 4.6–6.5)

## 2012-06-08 NOTE — Progress Notes (Signed)
Subjective:    Patient ID: Toni Mcmillan, female    DOB: 20-Dec-1930, 78 y.o.   MRN: [id number]  HPI The patient is here for annual Medicare wellness examination and management of other chronic and acute problems. She has been doing well with no major illness, surgery or injury.   The risk factors are reflected in the social history.  The roster of all physicians providing medical care to patient - is listed in the Snapshot section of the chart.  Activities of daily living:  The patient is 100% inedpendent in all ADLs: dressing, toileting, feeding as well as independent mobility  Home safety : The patient has smoke detectors in the home. They wear seatbelts. No firearms at home. There is no violence in the home.   There is no risks for hepatitis, STDs or HIV. There is no   history of blood transfusion. They have no travel history to infectious disease endemic areas of the world.  The patient has seen their dentist in the last six month. They have seen their eye doctor in the last year. They deny any hearing difficulty and have not had audiologic testing in the last year.  They do not  have excessive sun exposure. Discussed the need for sun protection: hats, long sleeves and use of sunscreen if there is significant sun exposure.   Diet: the importance of a healthy diet is discussed. They do have a healthy diet.  The patient has no regular exercise program but she is very active: gardens, housework, projects.  The benefits of regular aerobic exercise were discussed.  Depression screen: there are no signs or vegative symptoms of depression- irritability, change in appetite, anhedonia, sadness/tearfullness.  Cognitive assessment: the patient manages all their financial and personal affairs and is actively engaged.   The following portions of the patient's history were reviewed and updated as appropriate: allergies, current medications, past family history, past medical history,  past  surgical history, past social history  and problem list.  Vision, hearing, body mass index were assessed and reviewed.   During the course of the visit the patient was educated and counseled about appropriate screening and preventive services including : fall prevention , diabetes screening, nutrition counseling, colorectal cancer screening, and recommended immunizations.  Past Medical History  Diagnosis Date  . Unspecified essential hypertension   . Allergic rhinitis, cause unspecified   . Personal history of other diseases of digestive system   . Disturbances of sensation of smell and taste   . Insomnia, unspecified   . Unspecified sinusitis (chronic)   . Other and unspecified hyperlipidemia   . Headache   . Whooping cough, unspecified organism   . Scarlet fever    Past Surgical History  Procedure Date  . Bilateral oophorectomy   . Cystoscopy     w/ stenting of both ureters  . Hydrosalpinx diagnostic laparoscopy   . Correction of hammertoe deformity   . Vericose vein surgery   . Cataract extraction, bilateral   . Abdominal hysterectomy   . Hemorrhoid surgery   . Tonsillectomy age 78   Family History  Problem Relation Age of Onset  . Cancer Mother     ?  . Diabetes Mother   . Other Mother     coagulopathy/ ruptured viscus  . Hypertension Father   . Hyperlipidemia Father     ?  Marland Kitchen Other Father     CVA  . Cancer Paternal Grandmother     breast   History  Social History  . Marital Status: Married    Spouse Name: N/A    Number of Children: N/A  . Years of Education: N/A   Occupational History  . Not on file.   Social History Main Topics  . Smoking status: Never Smoker   . Smokeless tobacco: Not on file  . Alcohol Use: Not on file  . Drug Use: Not on file  . Sexually Active: Not on file   Other Topics Concern  . 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 aspirin 81 MG tablet Take 81 mg by mouth daily.        . betamethasone dipropionate (DIPROLENE) 0.05 % cream Apply topically daily.        . Calcium Carbonate-Vit D-Min (CALCIUM 1200 PO) Take 1 each by mouth daily.        Tery Sanfilippo Calcium (STOOL SOFTENER PO) Take 1 each by mouth daily.        . Famotidine (PEPCID PO) Take 1 each by mouth as needed.        . metoprolol (LOPRESSOR) 50 MG tablet TAKE ONE AND ONE-HALF TABLETS BY MOUTH TWICE DAILY  270 tablet  0  . Multiple Vitamins-Minerals (MULTIVITAMIN,TX-MINERALS) tablet Take 1 tablet by mouth daily.        . ramipril (ALTACE) 2.5 MG capsule Take 1 capsule (2.5 mg total) by mouth daily. Need to make appt. With Dr, Debby Bud for further refills  90 capsule  0  . vitamin C (ASCORBIC ACID) 500 MG tablet Take 500 mg by mouth daily.        Marland Kitchen VYTORIN 10-80 MG per tablet TAKE ONE TABLET BY MOUTH DAILY  30 tablet  3  . DISCONTD: zolpidem (AMBIEN) 10 MG tablet TAKE 1 TABLET BY MOUTH AT BEDTIME AS NEEDED FOR SLEEP  30 tablet  4  . zolpidem (AMBIEN) 10 MG tablet Take 1 tablet (10 mg total) by mouth at bedtime as needed for sleep.  15 tablet  1  . DISCONTD: ramipril (ALTACE) 1.25 MG capsule Take 1 capsule (1.25 mg total) by mouth daily.  90 capsule  4  . DISCONTD: zolpidem (AMBIEN) 10 MG tablet Take 1 tablet (10 mg total) by mouth at bedtime as needed for sleep.  30 tablet  5     Review of Systems Constitutional:  Negative for fever, chills, activity change and unexpected weight change.  HEENT:  Negative for hearing loss, ear pain, congestion, neck stiffness and postnasal drip. Negative for sore throat or swallowing problems. Negative for dental complaints.   Eyes: Negative for vision loss or change in visual acuity.  Respiratory: Negative for chest tightness and wheezing. Negative for DOE.   Cardiovascular:  Negative for chest pain or palpitations. No decreased exercise tolerance Gastrointestinal: No change in bowel habit. No bloating or gas. No reflux or indigestion Genitourinary: Negative for urgency, frequency, flank pain and difficulty urinating.  Musculoskeletal: Negative for myalgias, back pain, arthralgias and gait problem.  Neurological: Negative for dizziness, tremors, weakness and headaches.  Hematological: Negative for adenopathy.  Psychiatric/Behavioral: Negative for behavioral problems and dysphoric mood.       Objective:   Physical Exam Filed Vitals:   06/08/12 1345  BP: 132/80  Pulse: 60  Temp: 97.6 F (36.4  C)  Resp: 16   Wt Readings from Last 3 Encounters:  06/08/12 127 lb (57.607 kg)  07/01/11 129 lb (58.514 kg)  05/10/11 126 lb 12 oz (57.493 kg)   Gen'l: well nourished, well developed white woman in no distress HEENT - /AT, EACs/TMs normal, oropharynx with native dentition in good condition, no buccal or palatal lesions, posterior pharynx clear, mucous membranes moist. C&S clear, PERRLA, fundi - normal Neck - supple, no thyromegaly Nodes- negative submental, cervical, supraclavicular regions Chest - no deformity, no CVAT Lungs - cleat without rales, wheezes. No increased work of breathing Breast - deferred to gyn Cardiovascular - regular rate and rhythm, quiet precordium, no murmurs, rubs or gallops, 2+ radial, DP and PT pulses Abdomen - BS+ x 4, no HSM, no guarding or rebound or tenderness Pelvic - deferred to gyn Rectal - deferred to gyn Extremities - no clubbing, cyanosis, edema or deformity.  Neuro - A&O x 3, CN II-XII normal, motor strength normal and equal, DTRs 2+ and symmetrical biceps, radial, and patellar tendons. Cerebellar - no tremor, no rigidity, fluid movement and normal gait. Derm - Head, neck, back, abdomen and extremities without suspicious lesions  Lab Results  Component Value Date   WBC 8.7 05/10/2011   HGB 12.0 05/10/2011   HCT 36.2  05/10/2011   PLT 298.0 05/10/2011   GLUCOSE 91 06/08/2012   CHOL 107 06/08/2012   TRIG 118.0 06/08/2012   HDL 53.00 06/08/2012   LDLCALC 30 06/08/2012        ALT 14 06/08/2012   AST 22 06/08/2012        NA 138 06/08/2012   K 4.7 06/08/2012   CL 102 06/08/2012   CREATININE 0.6 06/08/2012   BUN 22 06/08/2012   CO2 29 06/08/2012   TSH 0.70 06/08/2012   HGBA1C 6.8* 06/08/2012         Assessment & Plan:

## 2012-06-10 NOTE — Assessment & Plan Note (Signed)
Interval history: she has been well w/o serious medical illness, surgery or injury. Physcial exam is normal. Lab results are in normal range. She is current with colon and breast cancer screening; immunizations are up to date.   In summary - a very nice woman who is medically stable. She is encouraged to continued her healthy ways. She will return prn.

## 2012-06-10 NOTE — Assessment & Plan Note (Signed)
A1C of 6.8 % is better than goal of 7%or less.  Plan- continued life-style management without need for medical therapy.

## 2012-06-10 NOTE — Assessment & Plan Note (Signed)
LDL cholesterol is very well controlled and much better than goal of 100 or less. Liver functions are normal.  Plan - continue present regimen.

## 2012-06-10 NOTE — Assessment & Plan Note (Signed)
BP Readings from Last 3 Encounters:  06/08/12 132/80  07/01/11 122/70  05/10/11 128/70   Adequate control on present medications. No changes.

## 2012-06-22 DIAGNOSIS — Z961 Presence of intraocular lens: Secondary | ICD-10-CM | POA: Insufficient documentation

## 2012-08-27 ENCOUNTER — Emergency Department (HOSPITAL_COMMUNITY)
Admission: EM | Admit: 2012-08-27 | Discharge: 2012-08-27 | Disposition: A | Discharge status: Home / Self Care | Payer: Medicare Other | Attending: Emergency Medicine | Admitting: Emergency Medicine

## 2012-08-27 ENCOUNTER — Emergency Department (HOSPITAL_COMMUNITY): Payer: Medicare Other

## 2012-08-27 ENCOUNTER — Encounter (HOSPITAL_COMMUNITY): Payer: Self-pay

## 2012-08-27 DIAGNOSIS — W010XXA Fall on same level from slipping, tripping and stumbling without subsequent striking against object, initial encounter: Secondary | ICD-10-CM | POA: Insufficient documentation

## 2012-08-27 DIAGNOSIS — S42309A Unspecified fracture of shaft of humerus, unspecified arm, initial encounter for closed fracture: Secondary | ICD-10-CM | POA: Insufficient documentation

## 2012-08-27 DIAGNOSIS — I10 Essential (primary) hypertension: Secondary | ICD-10-CM | POA: Insufficient documentation

## 2012-08-27 DIAGNOSIS — M25519 Pain in unspecified shoulder: Secondary | ICD-10-CM | POA: Insufficient documentation

## 2012-08-27 MED ORDER — HYDROMORPHONE HCL PF 1 MG/ML IJ SOLN
1.0000 mg | Freq: Once | INTRAMUSCULAR | Status: AC
  Administered 2012-08-27: 1 mg via INTRAVENOUS
  Filled 2012-08-27: qty 1

## 2012-08-27 MED ORDER — ONDANSETRON HCL 4 MG/2ML IJ SOLN
4.0000 mg | Freq: Once | INTRAMUSCULAR | Status: AC
  Administered 2012-08-27: 4 mg via INTRAVENOUS
  Filled 2012-08-27: qty 2

## 2012-08-27 MED ORDER — OXYCODONE-ACETAMINOPHEN 5-325 MG PO TABS: ORAL_TABLET | ORAL | Status: DC

## 2012-08-27 MED ORDER — ONDANSETRON HCL 4 MG PO TABS: 4.0000 mg | ORAL_TABLET | Freq: Four times a day (QID) | ORAL | Status: DC

## 2012-08-27 MED ORDER — HYDROMORPHONE HCL PF 2 MG/ML IJ SOLN: 2.0000 mg | Freq: Once | INTRAMUSCULAR | Status: DC

## 2012-08-27 NOTE — ED Provider Notes (Signed)
Medical screening examination/treatment/procedure(s) were performed by non-physician practitioner and as supervising physician I was immediately available for consultation/collaboration.    Nelia Shi, MD 08/27/12 2157

## 2012-08-27 NOTE — ED Notes (Addendum)
Ortho tech paged to apply arm sling.

## 2012-08-27 NOTE — ED Notes (Signed)
Pt tripped on doorway at home and landed on R arm.  Pt denies back, neck pain.  Pt c/o pain to R arm from shoulder to wrist.  Pain rated as 9/10.

## 2012-08-27 NOTE — Progress Notes (Signed)
Orthopedic Tech Progress Note Patient Details:  Toni Mcmillan 1931-10-17 [id number] Arm foam sling applied to Right UE. Family present in room with patient. Ortho Devices Type of Ortho Device: Arm foam sling Ortho Device/Splint Location: Right UE Ortho Device/Splint Interventions: Application   Asia R Thompson 08/27/2012, 2:52 PM

## 2012-08-27 NOTE — ED Provider Notes (Signed)
History     CSN: [id number]  Arrival date & time 08/27/12  1225   First MD Initiated Contact with Patient 08/27/12 1237      Chief Complaint  Patient presents with  . Fall  . Arm Pain    (Consider location/radiation/quality/duration/timing/severity/associated sxs/prior treatment) HPI  78 year old female in no acute distress appears acutely uncomfortable complaining of right shoulder pain status post trip and fall earlier in the day. Patient was carrying a box and tripped. Denies head trauma, LOC, headache, nausea vomiting, chest pain, syncope. Pain is severe 10 out of 10 and exacerbated by movement or palpation. Denies numbness and paresthesia.  Past Medical History  Diagnosis Date  . Unspecified essential hypertension   . Allergic rhinitis, cause unspecified   . Personal history of other diseases of digestive system   . Disturbances of sensation of smell and taste   . Insomnia, unspecified   . Unspecified sinusitis (chronic)   . Other and unspecified hyperlipidemia   . Headache   . Whooping cough, unspecified organism   . Scarlet fever     Past Surgical History  Procedure Date  . Bilateral oophorectomy   . Cystoscopy     w/ stenting of both ureters  . Hydrosalpinx diagnostic laparoscopy   . Correction of hammertoe deformity   . Vericose vein surgery   . Cataract extraction, bilateral   . Abdominal hysterectomy   . Hemorrhoid surgery   . Tonsillectomy age 17    Family History  Problem Relation Age of Onset  . Cancer Mother     ?  . Diabetes Mother   . Other Mother     coagulopathy/ ruptured viscus  . Hypertension Father   . Hyperlipidemia Father     ?  Marland Kitchen Other Father     CVA  . Cancer Paternal Grandmother     breast    History  Substance Use Topics  . Smoking status: Never Smoker   . Smokeless tobacco: Not on file  . Alcohol Use: No    OB History    Grav Para Term Preterm Abortions TAB SAB Ect Mult Living                  Review of Systems   Constitutional: Negative for fever.  Respiratory: Negative for shortness of breath.   Cardiovascular: Negative for chest pain.  Gastrointestinal: Negative for nausea, vomiting, abdominal pain and diarrhea.  Musculoskeletal: Positive for arthralgias.  All other systems reviewed and are negative.    Allergies  Erythromycin; Latex; Meperidine hcl; Morphine; Tetracycline; and Triprolidine-pse  Home Medications   Current Outpatient Rx  Name Route Sig Dispense Refill  . ASPIRIN EC 81 MG PO TBEC Oral Take 81 mg by mouth daily.    Marland Kitchen CALCIUM 1200 PO Oral Take 1 tablet by mouth daily.     Marland Kitchen EZETIMIBE-SIMVASTATIN 10-80 MG PO TABS Oral Take 1 tablet by mouth daily.    Marland Kitchen METOPROLOL TARTRATE 50 MG PO TABS Oral Take 75 mg by mouth 2 (two) times daily.    . SUPER HIGH VITAMINS/MINERALS PO TABS Oral Take 1 tablet by mouth daily.      Marland Kitchen RAMIPRIL 2.5 MG PO CAPS Oral Take 2.5 mg by mouth daily.    Marland Kitchen VITAMIN C 500 MG PO TABS Oral Take 500 mg by mouth daily.        BP 148/72  Pulse 49  Temp 98.2 F (36.8 C) (Oral)  Resp 18  SpO2 98%  Physical Exam  Nursing note and vitals reviewed. Constitutional: She is oriented to person, place, and time. She appears well-developed and well-nourished. No distress.  HENT:  Head: Normocephalic.  Eyes: Conjunctivae normal and EOM are normal.  Cardiovascular: Normal rate.   Pulmonary/Chest: Effort normal and breath sounds normal. No stridor.  Abdominal: Soft. Bowel sounds are normal.  Musculoskeletal: Normal range of motion.       No Deformity, TTP of proximal humerus, no ecchymoses. Neurovascularly intact  Neurological: She is alert and oriented to person, place, and time.  Psychiatric: She has a normal mood and affect.    ED Course  Procedures (including critical care time)  Labs Reviewed - No data to display Dg Shoulder Right  08/27/2012  *RADIOLOGY REPORT*  Clinical Data: Fall.  Shoulder pain.  RIGHT SHOULDER - 2+ VIEW  Comparison: None.  Findings:  Nonstandard projections submitted, likely due to patient pain / discomfort associated with fracture.  There is a transverse fracture of the proximal humeral metaphysis.  Extension of a surgical anatomic humeral neck is not well demonstrated.  Consider CT based on the limitations of the study.  Visualized right chest normal.  AC joint osteoarthritis.  IMPRESSION: Transverse proximal right humerus fracture, suboptimally evaluated. Consider CT for further assessment.   Original Report Authenticated By: Andreas Newport, M.D.    Dg Elbow Complete Right  08/27/2012  *RADIOLOGY REPORT*  Clinical Data: Fall  RIGHT ELBOW - COMPLETE 3+ VIEW  Comparison: None.  Findings: No acute fracture and no dislocation.  No evidence of joint effusion.  Chronic changes at the lateral humeral condyle.  IMPRESSION: No acute bony pathology.   Original Report Authenticated By: Donavan Burnet, M.D.    Dg Wrist Complete Right  08/27/2012  *RADIOLOGY REPORT*  Clinical Data: Fall  RIGHT WRIST - COMPLETE 3+ VIEW  Comparison: None.  Findings: Mild osteopenia.  No acute fracture and no dislocation. Chondrocalcinosis of the triangular fibro cartilage complex. No significant erosive changes.  IMPRESSION: No acute bony pathology. Chronic changes are noted.   Original Report Authenticated By: Donavan Burnet, M.D.      1. Humerus fracture       MDM  Mild Fx to distal humerus, Neurovascularly intact. Sling and Ortho referral given.    Pt verbalized understanding and agrees with care plan. Outpatient follow-up and return precautions given.    New Prescriptions   ONDANSETRON (ZOFRAN) 4 MG TABLET    Take 1 tablet (4 mg total) by mouth every 6 (six) hours.   OXYCODONE-ACETAMINOPHEN (PERCOCET/ROXICET) 5-325 MG PER TABLET    1 to 2 tabs PO q6hrs  PRN for pain          Wynetta Emery, PA-C 08/27/12 1604

## 2012-08-29 ENCOUNTER — Ambulatory Visit: Payer: Medicare Other

## 2012-09-03 ENCOUNTER — Other Ambulatory Visit: Payer: Self-pay | Admitting: Internal Medicine

## 2012-09-07 ENCOUNTER — Ambulatory Visit (INDEPENDENT_AMBULATORY_CARE_PROVIDER_SITE_OTHER): Payer: Medicare Other | Admitting: *Deleted

## 2012-09-07 DIAGNOSIS — Z23 Encounter for immunization: Secondary | ICD-10-CM

## 2012-09-09 ENCOUNTER — Other Ambulatory Visit: Payer: Self-pay | Admitting: Internal Medicine

## 2012-09-18 ENCOUNTER — Other Ambulatory Visit: Payer: Self-pay | Admitting: Internal Medicine

## 2012-10-16 ENCOUNTER — Other Ambulatory Visit: Payer: Self-pay | Admitting: Internal Medicine

## 2012-10-19 ENCOUNTER — Other Ambulatory Visit: Payer: Self-pay | Admitting: *Deleted

## 2012-10-19 MED ORDER — EZETIMIBE-SIMVASTATIN 10-80 MG PO TABS: 1.0000 | ORAL_TABLET | Freq: Every day | ORAL | Status: DC

## 2012-12-06 ENCOUNTER — Other Ambulatory Visit: Payer: Self-pay | Admitting: *Deleted

## 2012-12-07 MED ORDER — ZOLPIDEM TARTRATE 10 MG PO TABS: 5.0000 mg | ORAL_TABLET | Freq: Every evening | ORAL | Status: DC | PRN

## 2012-12-11 ENCOUNTER — Other Ambulatory Visit: Payer: Self-pay | Admitting: Internal Medicine

## 2013-03-04 ENCOUNTER — Other Ambulatory Visit: Payer: Self-pay | Admitting: Internal Medicine

## 2013-03-22 DIAGNOSIS — H43823 Vitreomacular adhesion, bilateral: Secondary | ICD-10-CM | POA: Insufficient documentation

## 2013-03-22 DIAGNOSIS — H1010 Acute atopic conjunctivitis, unspecified eye: Secondary | ICD-10-CM | POA: Insufficient documentation

## 2013-06-04 ENCOUNTER — Other Ambulatory Visit: Payer: Self-pay | Admitting: Internal Medicine

## 2013-07-09 ENCOUNTER — Encounter: Payer: Self-pay | Admitting: Internal Medicine

## 2013-07-09 ENCOUNTER — Other Ambulatory Visit (INDEPENDENT_AMBULATORY_CARE_PROVIDER_SITE_OTHER): Payer: Medicare Other

## 2013-07-09 ENCOUNTER — Ambulatory Visit (INDEPENDENT_AMBULATORY_CARE_PROVIDER_SITE_OTHER): Payer: Medicare Other | Admitting: Internal Medicine

## 2013-07-09 VITALS — BP 128/84 | HR 50 | Temp 97.7°F | Ht 62.0 in | Wt 128.8 lb

## 2013-07-09 DIAGNOSIS — I10 Essential (primary) hypertension: Secondary | ICD-10-CM

## 2013-07-09 DIAGNOSIS — Z0000 Encounter for general adult medical examination without abnormal findings: Secondary | ICD-10-CM

## 2013-07-09 DIAGNOSIS — R439 Unspecified disturbances of smell and taste: Secondary | ICD-10-CM

## 2013-07-09 DIAGNOSIS — E785 Hyperlipidemia, unspecified: Secondary | ICD-10-CM

## 2013-07-09 DIAGNOSIS — J309 Allergic rhinitis, unspecified: Secondary | ICD-10-CM

## 2013-07-09 DIAGNOSIS — E119 Type 2 diabetes mellitus without complications: Secondary | ICD-10-CM

## 2013-07-09 LAB — COMPREHENSIVE METABOLIC PANEL
ALT: 13 U/L (ref 0–35)
AST: 20 U/L (ref 0–37)
Albumin: 4 g/dL (ref 3.5–5.2)
Calcium: 9.4 mg/dL (ref 8.4–10.5)
Chloride: 101 mEq/L (ref 96–112)
Potassium: 4.8 mEq/L (ref 3.5–5.1)
Total Protein: 7.2 g/dL (ref 6.0–8.3)

## 2013-07-09 LAB — HEPATIC FUNCTION PANEL
Albumin: 4 g/dL (ref 3.5–5.2)
Total Protein: 7.2 g/dL (ref 6.0–8.3)

## 2013-07-09 LAB — HEMOGLOBIN AND HEMATOCRIT, BLOOD
HCT: 34.3 % — ABNORMAL LOW (ref 36.0–46.0)
Hemoglobin: 11.3 g/dL — ABNORMAL LOW (ref 12.0–15.0)

## 2013-07-09 LAB — HEMOGLOBIN A1C: Hgb A1c MFr Bld: 6.7 % — ABNORMAL HIGH (ref 4.6–6.5)

## 2013-07-09 LAB — LIPID PANEL: Total CHOL/HDL Ratio: 2

## 2013-07-09 LAB — TSH: TSH: 0.8 u[IU]/mL (ref 0.35–5.50)

## 2013-07-09 NOTE — Assessment & Plan Note (Signed)
On-going problem. Offered nasal inhalational steroid spray for control - she will think about it.

## 2013-07-09 NOTE — Assessment & Plan Note (Signed)
Interval history benign except for sunburn recently. Physical exam sans breast and pelvic (deferred to gyn) is normal. Lab results are fine. She is current with colorectal cancer screening, current with mammography. Her immunizations are up to date.  In summary -  A nice woman who is medically stable with chronic diseases being well managed. She will return in 1 year or sooner as needed.

## 2013-07-09 NOTE — Assessment & Plan Note (Signed)
Lab reveals excellent control with LDL better than goal, Liver functions - normal.  Plan Continue present medication

## 2013-07-09 NOTE — Assessment & Plan Note (Signed)
BP Readings from Last 3 Encounters:  07/09/13 128/84  08/27/12 148/72  06/08/12 132/80   Very good control on present medications w/o adverse side effects. Renal function is normal.  Plan Continue present medication

## 2013-07-09 NOTE — Assessment & Plan Note (Signed)
Lab Results  Component Value Date   HGBA1C 6.7* 07/09/2013   Mild diabetes with A1C below treatment threshold  Plan Continue diet and exercise.   Recheck A1c 6 months (order entered)

## 2013-07-09 NOTE — Patient Instructions (Addendum)
Thanks for coming in. Sorry about the sun burn but it looks better now.  Lab is ordered - results will be posted to MyChart.  You are up to date on prevention.  For chronic allergy you may want to retry a nasal inhalational steroid spray,e.g. flonase - let me know and I can send in an Rx.   Stay well and come back in a year or sooner if needed.

## 2013-07-09 NOTE — Assessment & Plan Note (Signed)
Some, but not full, recovery of sense of smell.

## 2013-07-09 NOTE — Progress Notes (Signed)
Subjective:    Patient ID: Toni Mcmillan, female    DOB: Mar 24, 1931, 79 y.o.   MRN: [id number]  HPI The patient is here for annual Medicare wellness examination and management of other chronic and acute problems.  In the interval she has had a broken right humerus and is making a pretty good recovery - some limitation in flexion. She has had no other major illness or surgery or injury. Her allergies have been acting up.   She has an appointment coming up with Dr. Becky Sax for gyn and breast exam along with mammogram.    The risk factors are reflected in the social history.  The roster of all physicians providing medical care to patient - is listed in the Snapshot section of the chart.  Activities of daily living:  The patient is 100% inedpendent in all ADLs: dressing, toileting, feeding as well as independent mobility  Home safety : The patient has smoke detectors in the home. Falls - one when she broke her right arm but not since. The fall was due to carrying boxes in the dark.  They wear seatbelts. Firearms are present in the home, kept in a safe fashion. There is no violence in the home.   There is no risks for hepatitis, STDs or HIV. There is no history of blood transfusion. They have no travel history to infectious disease endemic areas of the world.  The patient has seen their dentist in the last six month. They have seen their eye doctor in the last year. They deny any hearing difficulty and have not had audiologic testing in the last year.    They do not  have excessive sun exposure. Discussed the need for sun protection: hats, long sleeves and use of sunscreen if there is significant sun exposure.   Diet: the importance of a healthy diet is discussed. They do have a healthy diet.  The patient has no regular exercise program, but is very active in the yard and housework.  The benefits of regular aerobic exercise were discussed.  Depression screen: there are no signs or  vegative symptoms of depression- irritability, change in appetite, anhedonia, sadness/tearfullness.  Cognitive assessment: the patient manages all their financial and personal affairs and is actively engaged.   The following portions of the patient's history were reviewed and updated as appropriate: allergies, current medications, past family history, past medical history,  past surgical history, past social history  and problem list.  Vision, hearing, body mass index were assessed and reviewed.   During the course of the visit the patient was educated and counseled about appropriate screening and preventive services including : fall prevention , diabetes screening, nutrition counseling, colorectal cancer screening, and recommended immunizations.  Past Medical History  Diagnosis Date  . Unspecified essential hypertension   . Allergic rhinitis, cause unspecified   . Personal history of other diseases of digestive system   . Disturbances of sensation of smell and taste   . Insomnia, unspecified   . Unspecified sinusitis (chronic)   . Other and unspecified hyperlipidemia   . Headache(784.0)   . Whooping cough, unspecified organism   . Scarlet fever    Past Surgical History  Procedure Laterality Date  . Bilateral oophorectomy    . Cystoscopy      w/ stenting of both ureters  . Hydrosalpinx diagnostic laparoscopy    . Correction of hammertoe deformity    . Vericose vein surgery    . Cataract extraction, bilateral    .  Abdominal hysterectomy    . Hemorrhoid surgery    . Tonsillectomy  age 74   Family History  Problem Relation Age of Onset  . Cancer Mother     ?  . Diabetes Mother   . Other Mother     coagulopathy/ ruptured viscus  . Hypertension Father   . Hyperlipidemia Father     ?  Marland Kitchen Other Father     CVA  . Cancer Paternal Grandmother     breast   History   Social History  . Marital Status: Married    Spouse Name: N/A    Number of Children: N/A  . Years of  Education: N/A   Occupational History  . Not on file.   Social History Main Topics  . Smoking status: Never Smoker   . Smokeless tobacco: Not on file  . Alcohol Use: No  . Drug Use: No  . Sexual Activity: Not on file   Other Topics Concern  . 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 aspirin EC 81 MG tablet Take 81 mg by mouth daily.      . Calcium Carbonate-Vit D-Min (CALCIUM 1200 PO) Take 1 tablet by mouth daily.       Marland Kitchen ezetimibe-simvastatin (VYTORIN) 10-80 MG per tablet Take 1 tablet by mouth at bedtime.  90 tablet  4  . metoprolol (LOPRESSOR) 50 MG tablet TAKE ONE AND ONE-HALF TABLETS BY MOUTH TWICE DAILY - NEED TO SCHEDULE APPOINTMENT FOR APRIL  270 tablet  0  . Multiple Vitamins-Minerals (MULTIVITAMIN,TX-MINERALS) tablet Take 1 tablet by mouth daily.        . ramipril (ALTACE) 2.5 MG capsule Take 2.5 mg by mouth daily.      . ramipril (ALTACE) 2.5 MG capsule Take 1 capsule (2.5 mg total) by mouth daily.  90 capsule  3  . vitamin C (ASCORBIC ACID) 500 MG tablet Take 500 mg by mouth daily.        Marland Kitchen zolpidem (AMBIEN) 10 MG tablet TAKE 1 TABLET BY MOUTH AT BEDTIME AS NEEDED FOR SLEEP  30 tablet  3   No current facility-administered medications on file prior to visit.     Review of Systems Constitutional:  Negative for fever, chills, activity change and unexpected weight change.  HEENT:  Negative for hearing loss but did have cerumen impaction requiring ENT to do currettage and suctioning. No ear pain, congestion, neck stiffness. Positive for postnasal drip and AM cough. Negative for sore throat or swallowing problems. Had to have a tooth pulled due to abscess.   Eyes: Negative for vision loss or change in visual acuity.   Respiratory: Negative for chest tightness and wheezing. Negative for DOE.   Cardiovascular: Negative for chest pain or palpitations. No decreased exercise tolerance Gastrointestinal: No change in bowel habit. No bloating or gas. No reflux or indigestion Genitourinary: Negative for urgency, frequency, flank pain and difficulty urinating.  Musculoskeletal: Negative for myalgias, back pain, arthralgias and gait problem.  Neurological: Negative for dizziness, tremors, weakness and headaches.  Hematological: Negative for adenopathy.  Psychiatric/Behavioral: Negative for behavioral problems and dysphoric mood.       Objective:   Physical Exam Filed Vitals:   07/09/13 1410  BP: 128/84  Pulse: 50  Temp: 97.7 F (36.5 C)   Wt Readings from Last 3 Encounters:  07/09/13 128 lb 12.8 oz (58.423 kg)  06/08/12 127 lb (57.607 kg)  07/01/11 129 lb (58.514 kg)   Gen'l: well nourished, well developed  Woman in no distress HEENT - [place]/AT, EACs some hair and some cerumen/TMs normal, oropharynx with remaining native dentition in good condition but missing premolars and molars left mandible, no buccal or palatal lesions, posterior pharynx clear, mucous membranes moist. C&S clear, PERRLA, fundi - normal Neck - supple, no thyromegaly Nodes- negative submental, cervical, supraclavicular regions Chest - no deformity, no CVAT Lungs - clear without rales, wheezes. No increased work of breathing Breast - deferred to Gyn Cardiovascular - regular rate and rhythm, quiet precordium, no murmurs, rubs or gallops, 2+ radial, DP and PT pulses Abdomen - BS+ x 4, no HSM, no guarding or rebound or tenderness Pelvic - deferred to gyn Rectal - deferred to gyn Extremities - no clubbing, cyanosis, edema or deformity.  Neuro - A&O x 3, CN II-XII normal, motor strength normal and equal, DTRs 2+ and symmetrical biceps, radial, and patellar tendons. Cerebellar - no tremor, no rigidity, fluid movement and normal gait. Derm -  Head, neck, back, abdomen and extremities without suspicious lesions  Recent Results (from the past 2160 hour(s))  HEMOGLOBIN A1C     Status: Abnormal   Collection Time    07/09/13  3:01 PM      Result Value Range   Hemoglobin A1C 6.7 (*) 4.6 - 6.5 %   Comment: Glycemic Control Guidelines for People with Diabetes:Non Diabetic:  <6%Goal of Therapy: <7%Additional Action Suggested:  >8%   HEPATIC FUNCTION PANEL     Status: None   Collection Time    07/09/13  3:01 PM      Result Value Range   Total Bilirubin 0.4  0.3 - 1.2 mg/dL   Bilirubin, Direct 0.0  0.0 - 0.3 mg/dL   Alkaline Phosphatase 49  39 - 117 U/L   AST 20  0 - 37 U/L   ALT 13  0 - 35 U/L   Total Protein 7.2  6.0 - 8.3 g/dL   Albumin 4.0  3.5 - 5.2 g/dL  TSH     Status: None   Collection Time    07/09/13  3:01 PM      Result Value Range   TSH 0.80  0.35 - 5.50 uIU/mL  COMPREHENSIVE METABOLIC PANEL     Status: None   Collection Time    07/09/13  3:01 PM      Result Value Range   Sodium 136  135 - 145 mEq/L   Potassium 4.8  3.5 - 5.1 mEq/L   Chloride 101  96 - 112 mEq/L   CO2 29  19 - 32 mEq/L   Glucose, Bld 89  70 - 99 mg/dL   BUN 16  6 - 23 mg/dL   Creatinine, Ser 0.7  0.4 - 1.2 mg/dL   Total Bilirubin 0.4  0.3 - 1.2 mg/dL   Alkaline Phosphatase 49  39 - 117 U/L   AST 20  0 - 37 U/L   ALT 13  0 - 35 U/L   Total Protein 7.2  6.0 - 8.3 g/dL   Albumin 4.0  3.5 - 5.2 g/dL   Calcium 9.4  8.4 - 13.0 mg/dL   GFR 86.57  >84.69 mL/min  LIPID PANEL     Status: None   Collection Time  07/09/13  3:01 PM      Result Value Range   Cholesterol 108  0 - 200 mg/dL   Comment: ATP III Classification       Desirable:  < 200 mg/dL               Borderline High:  200 - 239 mg/dL          High:  > = 161 mg/dL   Triglycerides 09.6  0.0 - 149.0 mg/dL   Comment: Normal:  <045 mg/dLBorderline High:  150 - 199 mg/dL   HDL 40.98  >11.91 mg/dL   VLDL 47.8  0.0 - 29.5 mg/dL   LDL Cholesterol 44  0 - 99 mg/dL   Total CHOL/HDL Ratio 2      Comment:                Men          Women1/2 Average Risk     3.4          3.3Average Risk          5.0          4.42X Average Risk          9.6          7.13X Average Risk          15.0          11.0                      HEMOGLOBIN AND HEMATOCRIT, BLOOD     Status: Abnormal   Collection Time    07/09/13  3:01 PM      Result Value Range   Hemoglobin 11.3 (*) 12.0 - 15.0 g/dL   HCT 62.1 (*) 30.8 - 65.7 %         Assessment & Plan:

## 2013-08-21 ENCOUNTER — Ambulatory Visit (INDEPENDENT_AMBULATORY_CARE_PROVIDER_SITE_OTHER): Payer: Medicare Other

## 2013-08-21 DIAGNOSIS — Z23 Encounter for immunization: Secondary | ICD-10-CM

## 2013-08-25 ENCOUNTER — Other Ambulatory Visit: Payer: Self-pay | Admitting: Internal Medicine

## 2013-09-03 ENCOUNTER — Other Ambulatory Visit: Payer: Self-pay

## 2013-09-03 ENCOUNTER — Other Ambulatory Visit: Payer: Self-pay | Admitting: Internal Medicine

## 2013-09-03 MED ORDER — ZOLPIDEM TARTRATE 10 MG PO TABS: ORAL_TABLET | ORAL | Status: DC

## 2013-09-03 NOTE — Telephone Encounter (Signed)
Zolpidem called to pharmacy  

## 2013-10-21 ENCOUNTER — Other Ambulatory Visit: Payer: Self-pay | Admitting: Internal Medicine

## 2013-12-12 ENCOUNTER — Ambulatory Visit (INDEPENDENT_AMBULATORY_CARE_PROVIDER_SITE_OTHER): Payer: Medicare Other | Admitting: Internal Medicine

## 2013-12-12 ENCOUNTER — Encounter: Payer: Self-pay | Admitting: Internal Medicine

## 2013-12-12 VITALS — BP 140/84 | HR 54 | Temp 97.9°F | Wt 128.8 lb

## 2013-12-12 DIAGNOSIS — I951 Orthostatic hypotension: Secondary | ICD-10-CM

## 2013-12-12 DIAGNOSIS — G238 Other specified degenerative diseases of basal ganglia: Secondary | ICD-10-CM

## 2013-12-12 DIAGNOSIS — G903 Multi-system degeneration of the autonomic nervous system: Secondary | ICD-10-CM

## 2013-12-12 NOTE — Patient Instructions (Signed)
Positional vertigo ( or carotid dysautonomia) - a drop in perfusion pressure (blood flow) to the brain due to a decreased sensitivity of the carotid bulb to your change in position with the need for increased pumping force by the heart to overcome the change of position/overcome increased pull of gravity leading to a transient episode of "dizziness" that passes quickly (less than a minute)  Plan Rule of 20 - stand and count to 20 before moving.  Liberalize your salt intake  If the problem persists or gets worse the next step will be brain imaging.

## 2013-12-12 NOTE — Progress Notes (Signed)
Subjective:    Patient ID: Toni Mcmillan, female    DOB: 06-Aug-1931, 80 y.o.   MRN: [id number]  HPI Mrs. Lehtinen reports that she has dizziness when she moves from laying down or sitting to standing. This has been going on for several weeks. She has had URI problems with sneezing, itchy eyes, decreased hearing. No fever, no cough no coryza. However, this has not interfered with hydration.  She has had a problem with dizziness with hyper extension of the neck. She has a problem with dizziness if she bends over to pick up something off the floor.  Past Medical History  Diagnosis Date  . Unspecified essential hypertension   . Allergic rhinitis, cause unspecified   . Personal history of other diseases of digestive system   . Disturbances of sensation of smell and taste   . Insomnia, unspecified   . Unspecified sinusitis (chronic)   . Other and unspecified hyperlipidemia   . Headache(784.0)   . Whooping cough, unspecified organism   . Scarlet fever    Past Surgical History  Procedure Laterality Date  . Bilateral oophorectomy    . Cystoscopy      w/ stenting of both ureters  . Hydrosalpinx diagnostic laparoscopy    . Correction of hammertoe deformity    . Vericose vein surgery    . Cataract extraction, bilateral    . Abdominal hysterectomy    . Hemorrhoid surgery    . Tonsillectomy  age 67   Family History  Problem Relation Age of Onset  . Cancer Mother     ?  . Diabetes Mother   . Other Mother     coagulopathy/ ruptured viscus  . Hypertension Father   . Hyperlipidemia Father     ?  Marland Kitchen Other Father     CVA  . Cancer Paternal Grandmother     breast   History   Social History  . Marital Status: Married    Spouse Name: N/A    Number of Children: N/A  . Years of Education: N/A   Occupational History  . Not on file.   Social History Main Topics  . Smoking status: Never Smoker   . Smokeless tobacco: Not on file  . Alcohol Use: No  . Drug Use: No  . Sexual  Activity: Not on file   Other Topics Concern  . 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 aspirin EC 81 MG tablet Take 81 mg by mouth daily.      . Calcium Carbonate-Vit D-Min (CALCIUM 1200 PO) Take 1 tablet by mouth daily.       Marland Kitchen ezetimibe-simvastatin (VYTORIN) 10-80 MG per tablet Take 1 tablet by mouth at bedtime.  90 tablet  4  . metoprolol (LOPRESSOR) 50 MG tablet TAKE ONE AND ONE-HALF TABLETS BY MOUTH TWICE DAILY - NEED TO SCHEDULE APPOINTMENT  270 tablet  1  . Multiple Vitamins-Minerals (MULTIVITAMIN,TX-MINERALS) tablet Take 1 tablet by mouth daily.        . ramipril (ALTACE) 2.5 MG capsule TAKE ONE CAPSULE BY MOUTH DAILY  90 capsule  3  . vitamin C (ASCORBIC ACID) 500 MG tablet Take 500 mg by mouth daily.        Marland Kitchen  VYTORIN 10-80 MG per tablet TAKE ONE TABLET BY MOUTH DAILY  90 tablet  3  . zolpidem (AMBIEN) 10 MG tablet TAKE 1 TABLET BY MOUTH AT BEDTIME AS NEEDED FOR SLEEP  30 tablet  3   No current facility-administered medications on file prior to visit.     Review of Systems System review is negative for any constitutional, cardiac, pulmonary, GI or neuro symptoms or complaints other than as described in the HPI.     Objective:   Physical Exam Filed Vitals:   12/12/13 1616  BP: 140/84  Pulse: 54  Temp: 97.9 F (36.6 C)   BP Readings from Last 3 Encounters:  12/12/13 140/84  07/09/13 128/84  08/27/12 148/72   Gen'l - WNWD older woman in no acute distress HEENT- [place]/AT, C&S clear Neck - supple, has normal extension Cor 2+ radial, RRR Pulm - Normal respirations Neuro - A&O x 3, speech and cognition is normal. CN II - XII - normal facial symmetry and movement, PERRAL, EOMI w/o nystagmus, normal shoulder shrug.  Cerebellar - normal finger-nose, heel-shin; normal rapid finger movement; no tremor; no cog-wheeling; normal turn; normal gait and tandem gait. Has inducible dizziness with bending over and standing up.       Assessment & Plan:  Positional vertigo ( or carotid dysautonomia) - a drop in perfusion pressure (blood flow) to the brain due to a decreased sensitivity of the carotid bulb to your change in position with the need for increased pumping force by the heart to overcome the change of position/overcome increased pull of gravity leading to a transient episode of "dizziness" that passes quickly (less than a minute)  Plan Rule of 20 - stand and count to 20 before moving.  Liberalize your salt intake  If the problem persists or gets worse the next step will be brain imaging.

## 2013-12-12 NOTE — Progress Notes (Signed)
Pre visit review using our clinic review tool, if applicable. No additional management support is needed unless otherwise documented below in the visit note. 

## 2013-12-15 DIAGNOSIS — G903 Multi-system degeneration of the autonomic nervous system: Secondary | ICD-10-CM | POA: Insufficient documentation

## 2013-12-15 DIAGNOSIS — I951 Orthostatic hypotension: Secondary | ICD-10-CM | POA: Insufficient documentation

## 2013-12-15 NOTE — Assessment & Plan Note (Signed)
Positional vertigo ( or carotid dysautonomia) - a drop in perfusion pressure (blood flow) to the brain due to a decreased sensitivity of the carotid bulb to your change in position with the need for increased pumping force by the heart to overcome the change of position/overcome increased pull of gravity leading to a transient episode of "dizziness" that passes quickly (less than a minute)  Plan Rule of 20 - stand and count to 20 before moving.  Liberalize your salt intake  If the problem persists or gets worse the next step will be brain imaging.    

## 2014-03-12 ENCOUNTER — Other Ambulatory Visit: Payer: Self-pay | Admitting: *Deleted

## 2014-03-12 MED ORDER — METOPROLOL TARTRATE 50 MG PO TABS: ORAL_TABLET | ORAL | Status: DC

## 2014-05-09 ENCOUNTER — Ambulatory Visit (INDEPENDENT_AMBULATORY_CARE_PROVIDER_SITE_OTHER): Payer: Medicare Other | Admitting: Internal Medicine

## 2014-05-09 ENCOUNTER — Encounter: Payer: Self-pay | Admitting: Internal Medicine

## 2014-05-09 VITALS — BP 140/80 | HR 80 | Temp 98.4°F | Resp 16 | Ht 62.0 in | Wt 125.0 lb

## 2014-05-09 DIAGNOSIS — I10 Essential (primary) hypertension: Secondary | ICD-10-CM

## 2014-05-09 DIAGNOSIS — R55 Syncope and collapse: Secondary | ICD-10-CM

## 2014-05-09 NOTE — Progress Notes (Signed)
   Subjective:    HPI  New pt - a transfer from Dr Debby BudNorins  [place] and had to be admitted to ER w/syncope - checked out and released - ?vaso-vagal  The patient presents for a follow-up of  chronic hypertension, chronic dyslipidemia, allergies controlled with medicines     Review of Systems  Constitutional: Negative for chills, activity change, appetite change, fatigue and unexpected weight change.  HENT: Negative for congestion, mouth sores, sinus pressure and trouble swallowing.   Eyes: Negative for visual disturbance.  Respiratory: Negative for cough and chest tightness.   Gastrointestinal: Negative for nausea, abdominal pain and diarrhea.  Genitourinary: Negative for frequency, difficulty urinating and vaginal pain.  Musculoskeletal: Negative for back pain and gait problem.  Skin: Negative for pallor and rash.  Neurological: Positive for syncope. Negative for dizziness, tremors, weakness, numbness and headaches.  Psychiatric/Behavioral: Negative for confusion and sleep disturbance.       Objective:   Physical Exam  Constitutional: She appears well-developed. No distress.  HENT:  Head: Normocephalic.  Right Ear: External ear normal.  Left Ear: External ear normal.  Nose: Nose normal.  Mouth/Throat: Oropharynx is clear and moist.  Eyes: Conjunctivae are normal. Pupils are equal, round, and reactive to light. Right eye exhibits no discharge. Left eye exhibits no discharge.  Neck: Normal range of motion. Neck supple. No JVD present. No tracheal deviation present. No thyromegaly present.  Cardiovascular: Normal rate, regular rhythm and normal heart sounds.   Pulmonary/Chest: No stridor. No respiratory distress. She has no wheezes.  Abdominal: Soft. Bowel sounds are normal. She exhibits no distension and no mass. There is no tenderness. There is no rebound and no guarding.  Musculoskeletal: She exhibits no edema and no tenderness.  Lymphadenopathy:    She  has no cervical adenopathy.  Neurological: She displays normal reflexes. No cranial nerve deficit. She exhibits normal muscle tone. Coordination normal.  Skin: No rash noted. No erythema.  Psychiatric: She has a normal mood and affect. Her behavior is normal. Judgment and thought content normal.   Lab Results  Component Value Date   WBC 8.7 05/10/2011   HGB 11.3* 07/09/2013   HCT 34.3* 07/09/2013   PLT 298.0 05/10/2011   GLUCOSE 89 07/09/2013   CHOL 108 07/09/2013   TRIG 92.0 07/09/2013   HDL 45.50 07/09/2013   LDLCALC 44 07/09/2013   ALT 13 07/09/2013   ALT 13 07/09/2013   AST 20 07/09/2013   AST 20 07/09/2013   NA 136 07/09/2013   K 4.8 07/09/2013   CL 101 07/09/2013   CREATININE 0.7 07/09/2013   BUN 16 07/09/2013   CO2 29 07/09/2013   TSH 0.80 07/09/2013   HGBA1C 6.7* 07/09/2013        Assessment & Plan:

## 2014-05-09 NOTE — Assessment & Plan Note (Signed)
Continue with current prescription therapy as reflected on the Med list.  

## 2014-05-09 NOTE — Progress Notes (Signed)
Pre visit review using our clinic review tool, if applicable. No additional management support is needed unless otherwise documented below in the visit note. 

## 2014-05-09 NOTE — Assessment & Plan Note (Signed)
[place] and had to be admitted to ER w/syncope - checked out and released - ?vaso-vagal  No relapse

## 2014-05-10 ENCOUNTER — Telehealth: Payer: Self-pay | Admitting: Internal Medicine

## 2014-05-10 NOTE — Telephone Encounter (Signed)
Relevant patient education assigned to patient using Emmi. ° °

## 2014-05-21 ENCOUNTER — Telehealth: Payer: Self-pay | Admitting: *Deleted

## 2014-05-21 NOTE — Telephone Encounter (Signed)
OK to fill this prescription with additional refills x3 Thank you!  

## 2014-05-21 NOTE — Telephone Encounter (Signed)
Rf req for Zolpidem 10 mg 1 po qhs prn sleep.[place]. Ok to Rf?

## 2014-05-22 ENCOUNTER — Other Ambulatory Visit: Payer: Self-pay

## 2014-05-22 MED ORDER — ZOLPIDEM TARTRATE 10 MG PO TABS: ORAL_TABLET | ORAL | Status: DC

## 2014-05-22 NOTE — Telephone Encounter (Signed)
Done

## 2014-05-22 NOTE — Telephone Encounter (Signed)
rx printed and faxed

## 2014-07-11 ENCOUNTER — Encounter: Payer: Medicare Other | Admitting: Internal Medicine

## 2014-07-11 ENCOUNTER — Encounter: Payer: Self-pay | Admitting: Internal Medicine

## 2014-07-11 ENCOUNTER — Ambulatory Visit (INDEPENDENT_AMBULATORY_CARE_PROVIDER_SITE_OTHER): Payer: Medicare Other | Admitting: Internal Medicine

## 2014-07-11 VITALS — BP 140/80 | HR 64 | Temp 98.2°F | Resp 16 | Wt 124.0 lb

## 2014-07-11 DIAGNOSIS — Z0000 Encounter for general adult medical examination without abnormal findings: Secondary | ICD-10-CM

## 2014-07-11 DIAGNOSIS — I10 Essential (primary) hypertension: Secondary | ICD-10-CM

## 2014-07-11 DIAGNOSIS — E119 Type 2 diabetes mellitus without complications: Secondary | ICD-10-CM

## 2014-07-11 DIAGNOSIS — R55 Syncope and collapse: Secondary | ICD-10-CM

## 2014-07-11 DIAGNOSIS — Z23 Encounter for immunization: Secondary | ICD-10-CM

## 2014-07-11 DIAGNOSIS — E785 Hyperlipidemia, unspecified: Secondary | ICD-10-CM

## 2014-07-11 MED ORDER — RAMIPRIL 2.5 MG PO CAPS: ORAL_CAPSULE | ORAL | Status: DC

## 2014-07-11 MED ORDER — EZETIMIBE-SIMVASTATIN 10-80 MG PO TABS: ORAL_TABLET | ORAL | Status: DC

## 2014-07-11 MED ORDER — METOPROLOL TARTRATE 50 MG PO TABS: ORAL_TABLET | ORAL | Status: DC

## 2014-07-11 NOTE — Assessment & Plan Note (Signed)
Continue with current prescription therapy as reflected on the Med list.  

## 2014-07-11 NOTE — Progress Notes (Signed)
Pre visit review using our clinic review tool, if applicable. No additional management support is needed unless otherwise documented below in the visit note. 

## 2014-07-11 NOTE — Assessment & Plan Note (Signed)
No relapse 

## 2014-07-11 NOTE — Patient Instructions (Signed)
Preventive Care for Adults A healthy lifestyle and preventive care can promote health and wellness. Preventive health guidelines for women include the following key practices.  A routine yearly physical is a good way to check with your health care provider about your health and preventive screening. It is a chance to share any concerns and updates on your health and to receive a thorough exam.  Visit your dentist for a routine exam and preventive care every 6 months. Brush your teeth twice a day and floss once a day. Good oral hygiene prevents tooth decay and gum disease.  The frequency of eye exams is based on your age, health, family medical history, use of contact lenses, and other factors. Follow your health care provider's recommendations for frequency of eye exams.  Eat a healthy diet. Foods like vegetables, fruits, whole grains, low-fat dairy products, and lean protein foods contain the nutrients you need without too many calories. Decrease your intake of foods high in solid fats, added sugars, and salt. Eat the right amount of calories for you.Get information about a proper diet from your health care provider, if necessary.  Regular physical exercise is one of the most important things you can do for your health. Most adults should get at least 150 minutes of moderate-intensity exercise (any activity that increases your heart rate and causes you to sweat) each week. In addition, most adults need muscle-strengthening exercises on 2 or more days a week.  Maintain a healthy weight. The body mass index (BMI) is a screening tool to identify possible weight problems. It provides an estimate of body fat based on height and weight. Your health care provider can find your BMI and can help you achieve or maintain a healthy weight.For adults 20 years and older:  A BMI below 18.5 is considered underweight.  A BMI of 18.5 to 24.9 is normal.  A BMI of 25 to 29.9 is considered overweight.  A BMI of  30 and above is considered obese.  Maintain normal blood lipids and cholesterol levels by exercising and minimizing your intake of saturated fat. Eat a balanced diet with plenty of fruit and vegetables. Blood tests for lipids and cholesterol should begin at age 76 and be repeated every 5 years. If your lipid or cholesterol levels are high, you are over 50, or you are at high risk for heart disease, you may need your cholesterol levels checked more frequently.Ongoing high lipid and cholesterol levels should be treated with medicines if diet and exercise are not working.  If you smoke, find out from your health care provider how to quit. If you do not use tobacco, do not start.  Lung cancer screening is recommended for adults aged 22-80 years who are at high risk for developing lung cancer because of a history of smoking. A yearly low-dose CT scan of the lungs is recommended for people who have at least a 30-pack-year history of smoking and are a current smoker or have quit within the past 15 years. A pack year of smoking is smoking an average of 1 pack of cigarettes a day for 1 year (for example: 1 pack a day for 30 years or 2 packs a day for 15 years). Yearly screening should continue until the smoker has stopped smoking for at least 15 years. Yearly screening should be stopped for people who develop a health problem that would prevent them from having lung cancer treatment.  If you are pregnant, do not drink alcohol. If you are breastfeeding,  be very cautious about drinking alcohol. If you are not pregnant and choose to drink alcohol, do not have more than 1 drink per day. One drink is considered to be 12 ounces (355 mL) of beer, 5 ounces (148 mL) of wine, or 1.5 ounces (44 mL) of liquor.  Avoid use of street drugs. Do not share needles with anyone. Ask for help if you need support or instructions about stopping the use of drugs.  High blood pressure causes heart disease and increases the risk of  stroke. Your blood pressure should be checked at least every 1 to 2 years. Ongoing high blood pressure should be treated with medicines if weight loss and exercise do not work.  If you are 75-52 years old, ask your health care provider if you should take aspirin to prevent strokes.  Diabetes screening involves taking a blood sample to check your fasting blood sugar level. This should be done once every 3 years, after age 15, if you are within normal weight and without risk factors for diabetes. Testing should be considered at a younger age or be carried out more frequently if you are overweight and have at least 1 risk factor for diabetes.  Breast cancer screening is essential preventive care for women. You should practice "breast self-awareness." This means understanding the normal appearance and feel of your breasts and may include breast self-examination. Any changes detected, no matter how small, should be reported to a health care provider. Women in their 58s and 30s should have a clinical breast exam (CBE) by a health care provider as part of a regular health exam every 1 to 3 years. After age 16, women should have a CBE every year. Starting at age 53, women should consider having a mammogram (breast X-ray test) every year. Women who have a family history of breast cancer should talk to their health care provider about genetic screening. Women at a high risk of breast cancer should talk to their health care providers about having an MRI and a mammogram every year.  Breast cancer gene (BRCA)-related cancer risk assessment is recommended for women who have family members with BRCA-related cancers. BRCA-related cancers include breast, ovarian, tubal, and peritoneal cancers. Having family members with these cancers may be associated with an increased risk for harmful changes (mutations) in the breast cancer genes BRCA1 and BRCA2. Results of the assessment will determine the need for genetic counseling and  BRCA1 and BRCA2 testing.  Routine pelvic exams to screen for cancer are no longer recommended for nonpregnant women who are considered low risk for cancer of the pelvic organs (ovaries, uterus, and vagina) and who do not have symptoms. Ask your health care provider if a screening pelvic exam is right for you.  If you have had past treatment for cervical cancer or a condition that could lead to cancer, you need Pap tests and screening for cancer for at least 20 years after your treatment. If Pap tests have been discontinued, your risk factors (such as having a new sexual partner) need to be reassessed to determine if screening should be resumed. Some women have medical problems that increase the chance of getting cervical cancer. In these cases, your health care provider may recommend more frequent screening and Pap tests.  The HPV test is an additional test that may be used for cervical cancer screening. The HPV test looks for the virus that can cause the cell changes on the cervix. The cells collected during the Pap test can be  tested for HPV. The HPV test could be used to screen women aged 30 years and older, and should be used in women of any age who have unclear Pap test results. After the age of 30, women should have HPV testing at the same frequency as a Pap test.  Colorectal cancer can be detected and often prevented. Most routine colorectal cancer screening begins at the age of 50 years and continues through age 75 years. However, your health care provider may recommend screening at an earlier age if you have risk factors for colon cancer. On a yearly basis, your health care provider may provide home test kits to check for hidden blood in the stool. Use of a small camera at the end of a tube, to directly examine the colon (sigmoidoscopy or colonoscopy), can detect the earliest forms of colorectal cancer. Talk to your health care provider about this at age 50, when routine screening begins. Direct  exam of the colon should be repeated every 5-10 years through age 75 years, unless early forms of pre-cancerous polyps or small growths are found.  People who are at an increased risk for hepatitis B should be screened for this virus. You are considered at high risk for hepatitis B if:  You were born in a country where hepatitis B occurs often. Talk with your health care provider about which countries are considered high risk.  Your parents were born in a high-risk country and you have not received a shot to protect against hepatitis B (hepatitis B vaccine).  You have HIV or AIDS.  You use needles to inject street drugs.  You live with, or have sex with, someone who has hepatitis B.  You get hemodialysis treatment.  You take certain medicines for conditions like cancer, organ transplantation, and autoimmune conditions.  Hepatitis C blood testing is recommended for all people born from 1945 through 1965 and any individual with known risks for hepatitis C.  Practice safe sex. Use condoms and avoid high-risk sexual practices to reduce the spread of sexually transmitted infections (STIs). STIs include gonorrhea, chlamydia, syphilis, trichomonas, herpes, HPV, and human immunodeficiency virus (HIV). Herpes, HIV, and HPV are viral illnesses that have no cure. They can result in disability, cancer, and death.  You should be screened for sexually transmitted illnesses (STIs) including gonorrhea and chlamydia if:  You are sexually active and are younger than 24 years.  You are older than 24 years and your health care provider tells you that you are at risk for this type of infection.  Your sexual activity has changed since you were last screened and you are at an increased risk for chlamydia or gonorrhea. Ask your health care provider if you are at risk.  If you are at risk of being infected with HIV, it is recommended that you take a prescription medicine daily to prevent HIV infection. This is  called preexposure prophylaxis (PrEP). You are considered at risk if:  You are a heterosexual woman, are sexually active, and are at increased risk for HIV infection.  You take drugs by injection.  You are sexually active with a partner who has HIV.  Talk with your health care provider about whether you are at high risk of being infected with HIV. If you choose to begin PrEP, you should first be tested for HIV. You should then be tested every 3 months for as long as you are taking PrEP.  Osteoporosis is a disease in which the bones lose minerals and strength   with aging. This can result in serious bone fractures or breaks. The risk of osteoporosis can be identified using a bone density scan. Women ages 65 years and over and women at risk for fractures or osteoporosis should discuss screening with their health care providers. Ask your health care provider whether you should take a calcium supplement or vitamin D to reduce the rate of osteoporosis.  Menopause can be associated with physical symptoms and risks. Hormone replacement therapy is available to decrease symptoms and risks. You should talk to your health care provider about whether hormone replacement therapy is right for you.  Use sunscreen. Apply sunscreen liberally and repeatedly throughout the day. You should seek shade when your shadow is shorter than you. Protect yourself by wearing long sleeves, pants, a wide-brimmed hat, and sunglasses year round, whenever you are outdoors.  Once a month, do a whole body skin exam, using a mirror to look at the skin on your back. Tell your health care provider of new moles, moles that have irregular borders, moles that are larger than a pencil eraser, or moles that have changed in shape or color.  Stay current with required vaccines (immunizations).  Influenza vaccine. All adults should be immunized every year.  Tetanus, diphtheria, and acellular pertussis (Td, Tdap) vaccine. Pregnant women should  receive 1 dose of Tdap vaccine during each pregnancy. The dose should be obtained regardless of the length of time since the last dose. Immunization is preferred during the 27th-36th week of gestation. An adult who has not previously received Tdap or who does not know her vaccine status should receive 1 dose of Tdap. This initial dose should be followed by tetanus and diphtheria toxoids (Td) booster doses every 10 years. Adults with an unknown or incomplete history of completing a 3-dose immunization series with Td-containing vaccines should begin or complete a primary immunization series including a Tdap dose. Adults should receive a Td booster every 10 years.  Varicella vaccine. An adult without evidence of immunity to varicella should receive 2 doses or a second dose if she has previously received 1 dose. Pregnant females who do not have evidence of immunity should receive the first dose after pregnancy. This first dose should be obtained before leaving the health care facility. The second dose should be obtained 4-8 weeks after the first dose.  Human papillomavirus (HPV) vaccine. Females aged 13-26 years who have not received the vaccine previously should obtain the 3-dose series. The vaccine is not recommended for use in pregnant females. However, pregnancy testing is not needed before receiving a dose. If a female is found to be pregnant after receiving a dose, no treatment is needed. In that case, the remaining doses should be delayed until after the pregnancy. Immunization is recommended for any person with an immunocompromised condition through the age of 26 years if she did not get any or all doses earlier. During the 3-dose series, the second dose should be obtained 4-8 weeks after the first dose. The third dose should be obtained 24 weeks after the first dose and 16 weeks after the second dose.  Zoster vaccine. One dose is recommended for adults aged 60 years or older unless certain conditions are  present.  Measles, mumps, and rubella (MMR) vaccine. Adults born before 1957 generally are considered immune to measles and mumps. Adults born in 1957 or later should have 1 or more doses of MMR vaccine unless there is a contraindication to the vaccine or there is laboratory evidence of immunity to   each of the three diseases. A routine second dose of MMR vaccine should be obtained at least 28 days after the first dose for students attending postsecondary schools, health care workers, or international travelers. People who received inactivated measles vaccine or an unknown type of measles vaccine during 1963-1967 should receive 2 doses of MMR vaccine. People who received inactivated mumps vaccine or an unknown type of mumps vaccine before 1979 and are at high risk for mumps infection should consider immunization with 2 doses of MMR vaccine. For females of childbearing age, rubella immunity should be determined. If there is no evidence of immunity, females who are not pregnant should be vaccinated. If there is no evidence of immunity, females who are pregnant should delay immunization until after pregnancy. Unvaccinated health care workers born before 1957 who lack laboratory evidence of measles, mumps, or rubella immunity or laboratory confirmation of disease should consider measles and mumps immunization with 2 doses of MMR vaccine or rubella immunization with 1 dose of MMR vaccine.  Pneumococcal 13-valent conjugate (PCV13) vaccine. When indicated, a person who is uncertain of her immunization history and has no record of immunization should receive the PCV13 vaccine. An adult aged 19 years or older who has certain medical conditions and has not been previously immunized should receive 1 dose of PCV13 vaccine. This PCV13 should be followed with a dose of pneumococcal polysaccharide (PPSV23) vaccine. The PPSV23 vaccine dose should be obtained at least 8 weeks after the dose of PCV13 vaccine. An adult aged 19  years or older who has certain medical conditions and previously received 1 or more doses of PPSV23 vaccine should receive 1 dose of PCV13. The PCV13 vaccine dose should be obtained 1 or more years after the last PPSV23 vaccine dose.  Pneumococcal polysaccharide (PPSV23) vaccine. When PCV13 is also indicated, PCV13 should be obtained first. All adults aged 65 years and older should be immunized. An adult younger than age 65 years who has certain medical conditions should be immunized. Any person who resides in a nursing home or long-term care facility should be immunized. An adult smoker should be immunized. People with an immunocompromised condition and certain other conditions should receive both PCV13 and PPSV23 vaccines. People with human immunodeficiency virus (HIV) infection should be immunized as soon as possible after diagnosis. Immunization during chemotherapy or radiation therapy should be avoided. Routine use of PPSV23 vaccine is not recommended for American Indians, [place] Natives, or people younger than 65 years unless there are medical conditions that require PPSV23 vaccine. When indicated, people who have unknown immunization and have no record of immunization should receive PPSV23 vaccine. One-time revaccination 5 years after the first dose of PPSV23 is recommended for people aged 19-64 years who have chronic kidney failure, nephrotic syndrome, asplenia, or immunocompromised conditions. People who received 1-2 doses of PPSV23 before age 65 years should receive another dose of PPSV23 vaccine at age 65 years or later if at least 5 years have passed since the previous dose. Doses of PPSV23 are not needed for people immunized with PPSV23 at or after age 65 years.  Meningococcal vaccine. Adults with asplenia or persistent complement component deficiencies should receive 2 doses of quadrivalent meningococcal conjugate (MenACWY-D) vaccine. The doses should be obtained at least 2 months apart.  Microbiologists working with certain meningococcal bacteria, military recruits, people at risk during an outbreak, and people who travel to or live in countries with a high rate of meningitis should be immunized. A first-year college student up through age   21 years who is living in a residence hall should receive a dose if she did not receive a dose on or after her 16th birthday. Adults who have certain high-risk conditions should receive one or more doses of vaccine.  Hepatitis A vaccine. Adults who wish to be protected from this disease, have certain high-risk conditions, work with hepatitis A-infected animals, work in hepatitis A research labs, or travel to or work in countries with a high rate of hepatitis A should be immunized. Adults who were previously unvaccinated and who anticipate close contact with an international adoptee during the first 60 days after arrival in the [place] from a country with a high rate of hepatitis A should be immunized.  Hepatitis B vaccine. Adults who wish to be protected from this disease, have certain high-risk conditions, may be exposed to blood or other infectious body fluids, are household contacts or sex partners of hepatitis B positive people, are clients or workers in certain care facilities, or travel to or work in countries with a high rate of hepatitis B should be immunized.  Haemophilus influenzae type b (Hib) vaccine. A previously unvaccinated person with asplenia or sickle cell disease or having a scheduled splenectomy should receive 1 dose of Hib vaccine. Regardless of previous immunization, a recipient of a hematopoietic stem cell transplant should receive a 3-dose series 6-12 months after her successful transplant. Hib vaccine is not recommended for adults with HIV infection. Preventive Services / Frequency Ages 64 to 68 years  Blood pressure check.** / Every 1 to 2 years.  Lipid and cholesterol check.** / Every 5 years beginning at age  22.  Clinical breast exam.** / Every 3 years for women in their 88s and 53s.  BRCA-related cancer risk assessment.** / For women who have family members with a BRCA-related cancer (breast, ovarian, tubal, or peritoneal cancers).  Pap test.** / Every 2 years from ages 90 through 51. Every 3 years starting at age 21 through age 56 or 3 with a history of 3 consecutive normal Pap tests.  HPV screening.** / Every 3 years from ages 24 through ages 1 to 46 with a history of 3 consecutive normal Pap tests.  Hepatitis C blood test.** / For any individual with known risks for hepatitis C.  Skin self-exam. / Monthly.  Influenza vaccine. / Every year.  Tetanus, diphtheria, and acellular pertussis (Tdap, Td) vaccine.** / Consult your health care provider. Pregnant women should receive 1 dose of Tdap vaccine during each pregnancy. 1 dose of Td every 10 years.  Varicella vaccine.** / Consult your health care provider. Pregnant females who do not have evidence of immunity should receive the first dose after pregnancy.  HPV vaccine. / 3 doses over 6 months, if 72 and younger. The vaccine is not recommended for use in pregnant females. However, pregnancy testing is not needed before receiving a dose.  Measles, mumps, rubella (MMR) vaccine.** / You need at least 1 dose of MMR if you were born in 1957 or later. You may also need a 2nd dose. For females of childbearing age, rubella immunity should be determined. If there is no evidence of immunity, females who are not pregnant should be vaccinated. If there is no evidence of immunity, females who are pregnant should delay immunization until after pregnancy.  Pneumococcal 13-valent conjugate (PCV13) vaccine.** / Consult your health care provider.  Pneumococcal polysaccharide (PPSV23) vaccine.** / 1 to 2 doses if you smoke cigarettes or if you have certain conditions.  Meningococcal vaccine.** /  1 dose if you are age 19 to 21 years and a first-year college  student living in a residence hall, or have one of several medical conditions, you need to get vaccinated against meningococcal disease. You may also need additional booster doses.  Hepatitis A vaccine.** / Consult your health care provider.  Hepatitis B vaccine.** / Consult your health care provider.  Haemophilus influenzae type b (Hib) vaccine.** / Consult your health care provider. Ages 40 to 64 years  Blood pressure check.** / Every 1 to 2 years.  Lipid and cholesterol check.** / Every 5 years beginning at age 20 years.  Lung cancer screening. / Every year if you are aged 55-80 years and have a 30-pack-year history of smoking and currently smoke or have quit within the past 15 years. Yearly screening is stopped once you have quit smoking for at least 15 years or develop a health problem that would prevent you from having lung cancer treatment.  Clinical breast exam.** / Every year after age 40 years.  BRCA-related cancer risk assessment.** / For women who have family members with a BRCA-related cancer (breast, ovarian, tubal, or peritoneal cancers).  Mammogram.** / Every year beginning at age 40 years and continuing for as long as you are in good health. Consult with your health care provider.  Pap test.** / Every 3 years starting at age 30 years through age 65 or 70 years with a history of 3 consecutive normal Pap tests.  HPV screening.** / Every 3 years from ages 30 years through ages 65 to 70 years with a history of 3 consecutive normal Pap tests.  Fecal occult blood test (FOBT) of stool. / Every year beginning at age 50 years and continuing until age 75 years. You may not need to do this test if you get a colonoscopy every 10 years.  Flexible sigmoidoscopy or colonoscopy.** / Every 5 years for a flexible sigmoidoscopy or every 10 years for a colonoscopy beginning at age 50 years and continuing until age 75 years.  Hepatitis C blood test.** / For all people born from 1945 through  1965 and any individual with known risks for hepatitis C.  Skin self-exam. / Monthly.  Influenza vaccine. / Every year.  Tetanus, diphtheria, and acellular pertussis (Tdap/Td) vaccine.** / Consult your health care provider. Pregnant women should receive 1 dose of Tdap vaccine during each pregnancy. 1 dose of Td every 10 years.  Varicella vaccine.** / Consult your health care provider. Pregnant females who do not have evidence of immunity should receive the first dose after pregnancy.  Zoster vaccine.** / 1 dose for adults aged 60 years or older.  Measles, mumps, rubella (MMR) vaccine.** / You need at least 1 dose of MMR if you were born in 1957 or later. You may also need a 2nd dose. For females of childbearing age, rubella immunity should be determined. If there is no evidence of immunity, females who are not pregnant should be vaccinated. If there is no evidence of immunity, females who are pregnant should delay immunization until after pregnancy.  Pneumococcal 13-valent conjugate (PCV13) vaccine.** / Consult your health care provider.  Pneumococcal polysaccharide (PPSV23) vaccine.** / 1 to 2 doses if you smoke cigarettes or if you have certain conditions.  Meningococcal vaccine.** / Consult your health care provider.  Hepatitis A vaccine.** / Consult your health care provider.  Hepatitis B vaccine.** / Consult your health care provider.  Haemophilus influenzae type b (Hib) vaccine.** / Consult your health care provider. Ages 65   years and over  Blood pressure check.** / Every 1 to 2 years.  Lipid and cholesterol check.** / Every 5 years beginning at age 22 years.  Lung cancer screening. / Every year if you are aged 73-80 years and have a 30-pack-year history of smoking and currently smoke or have quit within the past 15 years. Yearly screening is stopped once you have quit smoking for at least 15 years or develop a health problem that would prevent you from having lung cancer  treatment.  Clinical breast exam.** / Every year after age 4 years.  BRCA-related cancer risk assessment.** / For women who have family members with a BRCA-related cancer (breast, ovarian, tubal, or peritoneal cancers).  Mammogram.** / Every year beginning at age 40 years and continuing for as long as you are in good health. Consult with your health care provider.  Pap test.** / Every 3 years starting at age 9 years through age 34 or 91 years with 3 consecutive normal Pap tests. Testing can be stopped between 65 and 70 years with 3 consecutive normal Pap tests and no abnormal Pap or HPV tests in the past 10 years.  HPV screening.** / Every 3 years from ages 57 years through ages 64 or 45 years with a history of 3 consecutive normal Pap tests. Testing can be stopped between 65 and 70 years with 3 consecutive normal Pap tests and no abnormal Pap or HPV tests in the past 10 years.  Fecal occult blood test (FOBT) of stool. / Every year beginning at age 15 years and continuing until age 17 years. You may not need to do this test if you get a colonoscopy every 10 years.  Flexible sigmoidoscopy or colonoscopy.** / Every 5 years for a flexible sigmoidoscopy or every 10 years for a colonoscopy beginning at age 86 years and continuing until age 71 years.  Hepatitis C blood test.** / For all people born from [birth year] through 1963 and any individual with known risks for hepatitis C.  Osteoporosis screening.** / A one-time screening for women ages 83 years and over and women at risk for fractures or osteoporosis.  Skin self-exam. / Monthly.  Influenza vaccine. / Every year.  Tetanus, diphtheria, and acellular pertussis (Tdap/Td) vaccine.** / 1 dose of Td every 10 years.  Varicella vaccine.** / Consult your health care provider.  Zoster vaccine.** / 1 dose for adults aged 61 years or older.  Pneumococcal 13-valent conjugate (PCV13) vaccine.** / Consult your health care provider.  Pneumococcal  polysaccharide (PPSV23) vaccine.** / 1 dose for all adults aged 28 years and older.  Meningococcal vaccine.** / Consult your health care provider.  Hepatitis A vaccine.** / Consult your health care provider.  Hepatitis B vaccine.** / Consult your health care provider.  Haemophilus influenzae type b (Hib) vaccine.** / Consult your health care provider. ** Family history and personal history of risk and conditions may change your health care provider's recommendations. Document Released: 12/28/2001 Document Revised: 03/18/2014 Document Reviewed: 03/29/2011 [facility] Patient Information 2015 [place], [place]. This information is not intended to replace advice given to you by your health care provider. Make sure you discuss any questions you have with your health care provider.

## 2014-07-11 NOTE — Progress Notes (Signed)
Patient ID: Toni Mcmillan, female   DOB: December 08, 1930, 80 y.o.   MRN: [id number]   Subjective:    HPI  The patient is here for a wellness exam. The patient has been doing well overall without major physical or psychological issues going on lately.  She was in [place] in 5/15 and had to be admitted to ER w/syncope - checked out and released - ?vaso-vagal  The patient presents for a follow-up of  chronic hypertension, chronic dyslipidemia, allergies controlled with medicines     Review of Systems  Constitutional: Negative for chills, activity change, appetite change, fatigue and unexpected weight change.  HENT: Negative for congestion, mouth sores, sinus pressure and trouble swallowing.   Eyes: Negative for visual disturbance.  Respiratory: Negative for cough and chest tightness.   Gastrointestinal: Negative for nausea, abdominal pain and diarrhea.  Genitourinary: Negative for frequency, difficulty urinating and vaginal pain.  Musculoskeletal: Negative for back pain and gait problem.  Skin: Negative for pallor and rash.  Neurological: Positive for syncope. Negative for dizziness, tremors, weakness, numbness and headaches.  Psychiatric/Behavioral: Negative for confusion and sleep disturbance.       Objective:   Physical Exam  Constitutional: She appears well-developed. No distress.  HENT:  Head: Normocephalic.  Right Ear: External ear normal.  Left Ear: External ear normal.  Nose: Nose normal.  Mouth/Throat: Oropharynx is clear and moist.  Eyes: Conjunctivae are normal. Pupils are equal, round, and reactive to light. Right eye exhibits no discharge. Left eye exhibits no discharge.  Neck: Normal range of motion. Neck supple. No JVD present. No tracheal deviation present. No thyromegaly present.  Cardiovascular: Normal rate, regular rhythm and normal heart sounds.   Pulmonary/Chest: No stridor. No respiratory distress. She has no wheezes.  Abdominal: Soft. Bowel sounds are  normal. She exhibits no distension and no mass. There is no tenderness. There is no rebound and no guarding.  Musculoskeletal: She exhibits no edema and no tenderness.  Lymphadenopathy:    She has no cervical adenopathy.  Neurological: She displays normal reflexes. No cranial nerve deficit. She exhibits normal muscle tone. Coordination normal.  Skin: No rash noted. No erythema.  Psychiatric: She has a normal mood and affect. Her behavior is normal. Judgment and thought content normal.   Lab Results  Component Value Date   WBC 8.7 05/10/2011   HGB 11.3* 07/09/2013   HCT 34.3* 07/09/2013   PLT 298.0 05/10/2011   GLUCOSE 89 07/09/2013   CHOL 108 07/09/2013   TRIG 92.0 07/09/2013   HDL 45.50 07/09/2013   LDLCALC 44 07/09/2013   ALT 13 07/09/2013   ALT 13 07/09/2013   AST 20 07/09/2013   AST 20 07/09/2013   NA 136 07/09/2013   K 4.8 07/09/2013   CL 101 07/09/2013   CREATININE 0.7 07/09/2013   BUN 16 07/09/2013   CO2 29 07/09/2013   TSH 0.80 07/09/2013   HGBA1C 6.7* 07/09/2013        Assessment & Plan:

## 2014-07-11 NOTE — Assessment & Plan Note (Signed)
Labs

## 2014-07-11 NOTE — Assessment & Plan Note (Addendum)
Here for medicare wellness/physical  Diet: heart healthy  Physical activity: not sedentary  Depression/mood screen: negative  Hearing: intact to whispered voice  Visual acuity: grossly normal, performs annual eye exam  ADLs: capable  Fall risk: low Home safety: good  Cognitive evaluation: intact to orientation, naming, recall and repetition  EOL planning: adv directives, full code/ I agree  I have personally reviewed and have noted  1. The patient's medical and social history  2. Their use of alcohol, tobacco or illicit drugs  3. Their current medications and supplements  4. The patient's functional ability including ADL's, fall risks, home safety risks and hearing or visual impairment.  5. Diet and physical activities  6. Evidence for depression or mood disorders    Today patient counseled on age appropriate routine health concerns for screening and prevention, each reviewed and up to date or declined. Immunizations reviewed and up to date or declined. Labs ordered and reviewed. Risk factors for depression reviewed and negative. Hearing function and visual acuity are intact. ADLs screened and addressed as needed. Functional ability and level of safety reviewed and appropriate. Education, counseling and referrals performed based on assessed risks today. Patient provided with a copy of personalized plan for preventive services.  Balance exercises Prevnar

## 2014-07-12 ENCOUNTER — Telehealth: Payer: Self-pay | Admitting: Internal Medicine

## 2014-07-12 ENCOUNTER — Ambulatory Visit (INDEPENDENT_AMBULATORY_CARE_PROVIDER_SITE_OTHER): Payer: Medicare Other | Admitting: *Deleted

## 2014-07-12 DIAGNOSIS — R55 Syncope and collapse: Secondary | ICD-10-CM

## 2014-07-12 DIAGNOSIS — E119 Type 2 diabetes mellitus without complications: Secondary | ICD-10-CM

## 2014-07-12 DIAGNOSIS — Z0000 Encounter for general adult medical examination without abnormal findings: Secondary | ICD-10-CM

## 2014-07-12 DIAGNOSIS — E785 Hyperlipidemia, unspecified: Secondary | ICD-10-CM

## 2014-07-12 LAB — URINALYSIS, ROUTINE W REFLEX MICROSCOPIC
Bilirubin Urine: NEGATIVE
Hgb urine dipstick: NEGATIVE
KETONES UR: NEGATIVE
Nitrite: NEGATIVE
SPECIFIC GRAVITY, URINE: 1.01 (ref 1.000–1.030)
Total Protein, Urine: NEGATIVE
UROBILINOGEN UA: 0.2 (ref 0.0–1.0)
Urine Glucose: NEGATIVE
pH: 6.5 (ref 5.0–8.0)

## 2014-07-12 LAB — CBC WITH DIFFERENTIAL/PLATELET
Basophils Absolute: 0 10*3/uL (ref 0.0–0.1)
Basophils Relative: 0.5 % (ref 0.0–3.0)
EOS PCT: 6.4 % — AB (ref 0.0–5.0)
Eosinophils Absolute: 0.6 10*3/uL (ref 0.0–0.7)
HEMATOCRIT: 35.8 % — AB (ref 36.0–46.0)
HEMOGLOBIN: 11.9 g/dL — AB (ref 12.0–15.0)
LYMPHS ABS: 2.5 10*3/uL (ref 0.7–4.0)
Lymphocytes Relative: 27.9 % (ref 12.0–46.0)
MCHC: 33.2 g/dL (ref 30.0–36.0)
MCV: 91.5 fl (ref 78.0–100.0)
MONO ABS: 0.6 10*3/uL (ref 0.1–1.0)
MONOS PCT: 7 % (ref 3.0–12.0)
NEUTROS ABS: 5.3 10*3/uL (ref 1.4–7.7)
Neutrophils Relative %: 58.2 % (ref 43.0–77.0)
Platelets: 278 10*3/uL (ref 150.0–400.0)
RBC: 3.92 Mil/uL (ref 3.87–5.11)
RDW: 12.8 % (ref 11.5–15.5)
WBC: 9 10*3/uL (ref 4.0–10.5)

## 2014-07-12 LAB — HEPATIC FUNCTION PANEL
ALBUMIN: 3.8 g/dL (ref 3.5–5.2)
ALK PHOS: 50 U/L (ref 39–117)
ALT: 18 U/L (ref 0–35)
AST: 27 U/L (ref 0–37)
Bilirubin, Direct: 0.1 mg/dL (ref 0.0–0.3)
TOTAL PROTEIN: 7 g/dL (ref 6.0–8.3)
Total Bilirubin: 0.6 mg/dL (ref 0.2–1.2)

## 2014-07-12 LAB — BASIC METABOLIC PANEL
BUN: 12 mg/dL (ref 6–23)
CHLORIDE: 105 meq/L (ref 96–112)
CO2: 30 mEq/L (ref 19–32)
Calcium: 9.4 mg/dL (ref 8.4–10.5)
Creatinine, Ser: 0.6 mg/dL (ref 0.4–1.2)
GFR: 94.58 mL/min (ref >60.00)
Glucose, Bld: 103 mg/dL — ABNORMAL HIGH (ref 70–99)
POTASSIUM: 4.7 meq/L (ref 3.5–5.1)
SODIUM: 141 meq/L (ref 135–145)

## 2014-07-12 LAB — LIPID PANEL
Cholesterol: 110 mg/dL (ref 0–200)
HDL: 50.9 mg/dL (ref >39.00)
LDL Cholesterol: 39 mg/dL (ref 0–99)
NONHDL: 59.1
Total CHOL/HDL Ratio: 2
Triglycerides: 99 mg/dL (ref 0.0–149.0)
VLDL: 19.8 mg/dL (ref 0.0–40.0)

## 2014-07-12 LAB — HEMOGLOBIN A1C: Hgb A1c MFr Bld: 6.6 % — ABNORMAL HIGH (ref 4.6–6.5)

## 2014-07-12 LAB — TSH: TSH: 1.59 u[IU]/mL (ref 0.35–4.50)

## 2014-07-12 MED ORDER — CIPROFLOXACIN HCL 250 MG PO TABS: 250.0000 mg | ORAL_TABLET | Freq: Two times a day (BID) | ORAL | Status: DC

## 2014-07-12 MED ORDER — SIMVASTATIN 40 MG PO TABS: 40.0000 mg | ORAL_TABLET | Freq: Every day | ORAL | Status: DC

## 2014-07-12 NOTE — Telephone Encounter (Signed)
Pt came by to request that Rx for VYTORIN be changed to generic ZOCOR med. Pt states it would be provided at a cheaper price at her pharmacy. Pt would like Rx called into [facility] on [address]. Please contact pt when request has been completed.

## 2014-07-12 NOTE — Telephone Encounter (Signed)
Done. Thx.

## 2014-07-12 NOTE — Addendum Note (Signed)
Addended by: Merrilyn Puma on: 07/12/2014 09:40 AM   Modules accepted: Orders

## 2014-07-12 NOTE — Telephone Encounter (Signed)
Notified pt md sent rx.Marland KitchenRaechel Chute

## 2014-08-14 ENCOUNTER — Ambulatory Visit (INDEPENDENT_AMBULATORY_CARE_PROVIDER_SITE_OTHER): Payer: Medicare Other | Admitting: Geriatric Medicine

## 2014-08-14 DIAGNOSIS — Z23 Encounter for immunization: Secondary | ICD-10-CM

## 2014-08-22 ENCOUNTER — Telehealth: Payer: Self-pay | Admitting: Internal Medicine

## 2014-08-22 MED ORDER — RAMIPRIL 2.5 MG PO CAPS
ORAL_CAPSULE | ORAL | Status: DC
Start: 2014-08-22 — End: 2015-08-17

## 2014-08-22 NOTE — Telephone Encounter (Signed)
Rf sent. Pt informed  

## 2014-08-22 NOTE — Telephone Encounter (Signed)
Mathew from [facility] called and stated that he did not get the request for the Ramipril 2.5 mg. Could you please resend it.  Thank you

## 2014-08-30 ENCOUNTER — Other Ambulatory Visit: Payer: Self-pay

## 2014-11-05 ENCOUNTER — Other Ambulatory Visit: Payer: Self-pay | Admitting: Internal Medicine

## 2015-02-13 ENCOUNTER — Telehealth: Payer: Self-pay | Admitting: *Deleted

## 2015-02-13 ENCOUNTER — Other Ambulatory Visit: Payer: Self-pay

## 2015-02-13 MED ORDER — ZOLPIDEM TARTRATE 10 MG PO TABS: ORAL_TABLET | ORAL | Status: DC

## 2015-02-13 NOTE — Telephone Encounter (Signed)
Please advise ok to Rf in PCP's absence? Thanks!

## 2015-02-13 NOTE — Telephone Encounter (Signed)
rf req for Zolpidem 10 mg 1 po qhs prn sleep. Last filled 10/29/14. Ok to Rf?

## 2015-02-13 NOTE — Telephone Encounter (Signed)
yes

## 2015-02-14 NOTE — Telephone Encounter (Signed)
Rf phoned in by VistaStefannie, CMA on 02/13/15.

## 2015-04-23 ENCOUNTER — Encounter: Payer: Self-pay | Admitting: Internal Medicine

## 2015-04-23 ENCOUNTER — Other Ambulatory Visit (INDEPENDENT_AMBULATORY_CARE_PROVIDER_SITE_OTHER): Payer: Medicare Other

## 2015-04-23 ENCOUNTER — Ambulatory Visit (INDEPENDENT_AMBULATORY_CARE_PROVIDER_SITE_OTHER): Payer: Medicare Other | Admitting: Internal Medicine

## 2015-04-23 VITALS — BP 140/86 | HR 53 | Wt 124.0 lb

## 2015-04-23 DIAGNOSIS — K921 Melena: Secondary | ICD-10-CM | POA: Insufficient documentation

## 2015-04-23 DIAGNOSIS — R159 Full incontinence of feces: Secondary | ICD-10-CM | POA: Diagnosis not present

## 2015-04-23 DIAGNOSIS — R140 Abdominal distension (gaseous): Secondary | ICD-10-CM

## 2015-04-23 LAB — CBC WITH DIFFERENTIAL/PLATELET
Basophils Absolute: 0 10*3/uL (ref 0.0–0.1)
Basophils Relative: 0.3 % (ref 0.0–3.0)
EOS ABS: 0.3 10*3/uL (ref 0.0–0.7)
Eosinophils Relative: 3.9 % (ref 0.0–5.0)
HCT: 38 % (ref 36.0–46.0)
Hemoglobin: 12.5 g/dL (ref 12.0–15.0)
Lymphocytes Relative: 32.4 % (ref 12.0–46.0)
Lymphs Abs: 2.6 10*3/uL (ref 0.7–4.0)
MCHC: 33 g/dL (ref 30.0–36.0)
MCV: 90.8 fl (ref 78.0–100.0)
MONOS PCT: 6.6 % (ref 3.0–12.0)
Monocytes Absolute: 0.5 10*3/uL (ref 0.1–1.0)
Neutro Abs: 4.5 10*3/uL (ref 1.4–7.7)
Neutrophils Relative %: 56.8 % (ref 43.0–77.0)
Platelets: 338 10*3/uL (ref 150.0–400.0)
RBC: 4.18 Mil/uL (ref 3.87–5.11)
RDW: 13.3 % (ref 11.5–15.5)
WBC: 8 10*3/uL (ref 4.0–10.5)

## 2015-04-23 LAB — HEPATIC FUNCTION PANEL
ALK PHOS: 55 U/L (ref 39–117)
ALT: 11 U/L (ref 0–35)
AST: 20 U/L (ref 0–37)
Albumin: 4.3 g/dL (ref 3.5–5.2)
BILIRUBIN DIRECT: 0 mg/dL (ref 0.0–0.3)
BILIRUBIN TOTAL: 0.5 mg/dL (ref 0.2–1.2)
TOTAL PROTEIN: 7.3 g/dL (ref 6.0–8.3)

## 2015-04-23 LAB — BASIC METABOLIC PANEL
BUN: 12 mg/dL (ref 6–23)
CO2: 30 meq/L (ref 19–32)
CREATININE: 0.7 mg/dL (ref 0.40–1.20)
Calcium: 10 mg/dL (ref 8.4–10.5)
Chloride: 104 mEq/L (ref 96–112)
GFR: 85.13 mL/min (ref >60.00)
Glucose, Bld: 104 mg/dL — ABNORMAL HIGH (ref 70–99)
Potassium: 4.7 mEq/L (ref 3.5–5.1)
SODIUM: 138 meq/L (ref 135–145)

## 2015-04-23 LAB — SEDIMENTATION RATE: Sed Rate: 34 mm/hr — ABNORMAL HIGH (ref 0–22)

## 2015-04-23 LAB — TSH: TSH: 1.23 u[IU]/mL (ref 0.35–4.50)

## 2015-04-23 MED ORDER — METRONIDAZOLE 500 MG PO TABS: 500.0000 mg | ORAL_TABLET | Freq: Three times a day (TID) | ORAL | Status: DC

## 2015-04-23 MED ORDER — ALIGN 4 MG PO CAPS: 1.0000 | ORAL_CAPSULE | Freq: Every day | ORAL | Status: DC

## 2015-04-23 NOTE — Assessment & Plan Note (Signed)
6/16 w/gas passing

## 2015-04-23 NOTE — Assessment & Plan Note (Signed)
Will consult Dr Josefa HalfM [facility]

## 2015-04-23 NOTE — Progress Notes (Signed)
Pre visit review using our clinic review tool, if applicable. No additional management support is needed unless otherwise documented below in the visit note. 

## 2015-04-23 NOTE — Progress Notes (Signed)
Patient ID: Toni Mcmillan, female   DOB: 07/10/1931, 81 y.o.   MRN: [id number]   Subjective:    HPI  C/o bloating, blood in stool, mild incontinence to stool w/gas passing. No wt loss.Her GI is Dr Josefa HalfM [facility]   [place] and had to be admitted to ER w/syncope - checked out and released - ?vaso-vagal  The patient presents for a follow-up of  chronic hypertension, chronic dyslipidemia, allergies controlled with medicines   Wt Readings from Last 3 Encounters:  04/23/15 124 lb (56.246 kg)  07/11/14 124 lb (56.246 kg)  05/09/14 125 lb (56.7 kg)     Review of Systems  Constitutional: Negative for chills, activity change, appetite change, fatigue and unexpected weight change.  HENT: Negative for congestion, mouth sores, sinus pressure and trouble swallowing.   Eyes: Negative for visual disturbance.  Respiratory: Negative for cough and chest tightness.   Gastrointestinal: Positive for abdominal pain, constipation, blood in stool and abdominal distention. Negative for nausea, vomiting, diarrhea and rectal pain.  Genitourinary: Negative for frequency, difficulty urinating and vaginal pain.  Musculoskeletal: Negative for back pain and gait problem.  Skin: Negative for pallor and rash.  Neurological: Negative for dizziness, tremors, syncope, weakness, numbness and headaches.  Psychiatric/Behavioral: Negative for confusion and sleep disturbance.       Objective:   Physical Exam  Constitutional: She appears well-developed and well-nourished. No distress.  Not pale  HENT:  Head: Normocephalic.  Right Ear: External ear normal.  Left Ear: External ear normal.  Nose: Nose normal.  Mouth/Throat: Oropharynx is clear and moist.  Eyes: Conjunctivae are normal. Pupils are equal, round, and reactive to light. Right eye exhibits no discharge. Left eye exhibits no discharge.  Neck: Normal range of motion. Neck supple. No JVD present. No tracheal deviation present. No thyromegaly  present.  Cardiovascular: Normal rate, regular rhythm and normal heart sounds.   Pulmonary/Chest: No stridor. No respiratory distress. She has no wheezes.  Abdominal: Soft. Bowel sounds are normal. She exhibits no distension and no mass. There is tenderness. There is no rebound and no guarding.  LLQ and RLQ are sensitive  Musculoskeletal: She exhibits no edema or tenderness.  Lymphadenopathy:    She has no cervical adenopathy.  Neurological: She displays normal reflexes. No cranial nerve deficit. She exhibits normal muscle tone. Coordination normal.  Skin: No rash noted. No erythema.  Psychiatric: She has a normal mood and affect. Her behavior is normal. Judgment and thought content normal.  Pt declined rectal - Dr Laural BenesJohnson will do it  Lab Results  Component Value Date   WBC 9.0 07/12/2014   HGB 11.9* 07/12/2014   HCT 35.8* 07/12/2014   PLT 278.0 07/12/2014   GLUCOSE 103* 07/12/2014   CHOL 110 07/12/2014   TRIG 99.0 07/12/2014   HDL 50.90 07/12/2014   LDLCALC 39 07/12/2014   ALT 18 07/12/2014   AST 27 07/12/2014   NA 141 07/12/2014   K 4.7 07/12/2014   CL 105 07/12/2014   CREATININE 0.6 07/12/2014   BUN 12 07/12/2014   CO2 30 07/12/2014   TSH 1.59 07/12/2014   HGBA1C 6.6* 07/12/2014        Assessment & Plan:  Patient ID: Toni Mcmillan, female   DOB: 03/30/1931, 81 y.o.   MRN: [id number]

## 2015-04-23 NOTE — Assessment & Plan Note (Addendum)
6/16 chronic - worse Flagyl and Align. Hold Miralax to see if better Labs GI ref

## 2015-05-07 ENCOUNTER — Telehealth: Payer: Self-pay | Admitting: Internal Medicine

## 2015-05-07 NOTE — Telephone Encounter (Signed)
I called pt to get more info. She is inquiring about referral to Dr. Laural Benes at [facility]. Pt informed - Nilda Calamity., [facility] has faxed over notes to [facility] and noted that they will contact pt directly.   She is also requesting a referral for edema/ burning sensation in bilateral feet/legs and yet at the same time her toes are cold to touch. Right leg/foot is worse. Please advise

## 2015-05-07 NOTE — Telephone Encounter (Signed)
F/u w/me please for legs/feet issue in a couple weeks Thx

## 2015-05-07 NOTE — Telephone Encounter (Signed)
Patient stated that she called, and never got a response from anyone, she want to find out what going on. Please advise

## 2015-05-12 ENCOUNTER — Other Ambulatory Visit: Payer: Self-pay

## 2015-05-14 NOTE — Telephone Encounter (Signed)
I tried calling pt- # busy. Will try again later to offer OV.

## 2015-05-14 NOTE — Telephone Encounter (Signed)
Pt informed - she is scheduled 06/04/15 with PCP. She will keep that appt.

## 2015-05-15 ENCOUNTER — Other Ambulatory Visit: Payer: Self-pay | Admitting: Gastroenterology

## 2015-05-16 ENCOUNTER — Other Ambulatory Visit: Payer: Self-pay | Admitting: Gastroenterology

## 2015-05-21 ENCOUNTER — Encounter (HOSPITAL_COMMUNITY): Payer: Self-pay | Admitting: *Deleted

## 2015-05-29 ENCOUNTER — Encounter (HOSPITAL_COMMUNITY)
Admission: RE | Disposition: A | Discharge status: Home / Self Care | Payer: Self-pay | Source: Ambulatory Visit | Attending: Gastroenterology

## 2015-05-29 ENCOUNTER — Encounter (HOSPITAL_COMMUNITY): Payer: Self-pay | Admitting: *Deleted

## 2015-05-29 ENCOUNTER — Ambulatory Visit (HOSPITAL_COMMUNITY)
Admission: RE | Admit: 2015-05-29 | Discharge: 2015-05-29 | Disposition: A | Discharge status: Home / Self Care | Payer: Medicare Other | Source: Ambulatory Visit | Attending: Gastroenterology | Admitting: Gastroenterology

## 2015-05-29 ENCOUNTER — Ambulatory Visit (HOSPITAL_COMMUNITY): Payer: Medicare Other | Admitting: Anesthesiology

## 2015-05-29 DIAGNOSIS — R152 Fecal urgency: Secondary | ICD-10-CM | POA: Insufficient documentation

## 2015-05-29 DIAGNOSIS — R159 Full incontinence of feces: Secondary | ICD-10-CM | POA: Diagnosis not present

## 2015-05-29 DIAGNOSIS — I10 Essential (primary) hypertension: Secondary | ICD-10-CM | POA: Insufficient documentation

## 2015-05-29 DIAGNOSIS — K5730 Diverticulosis of large intestine without perforation or abscess without bleeding: Secondary | ICD-10-CM | POA: Diagnosis not present

## 2015-05-29 DIAGNOSIS — R140 Abdominal distension (gaseous): Secondary | ICD-10-CM | POA: Insufficient documentation

## 2015-05-29 DIAGNOSIS — Z86010 Personal history of colonic polyps: Secondary | ICD-10-CM | POA: Insufficient documentation

## 2015-05-29 DIAGNOSIS — K648 Other hemorrhoids: Secondary | ICD-10-CM | POA: Insufficient documentation

## 2015-05-29 HISTORY — DX: Type 2 diabetes mellitus without complications: E11.9

## 2015-05-29 HISTORY — PX: COLONOSCOPY WITH PROPOFOL: SHX5780

## 2015-05-29 SURGERY — COLONOSCOPY WITH PROPOFOL
Anesthesia: Monitor Anesthesia Care

## 2015-05-29 MED ORDER — PROPOFOL INFUSION 10 MG/ML OPTIME
INTRAVENOUS | Status: DC | PRN
  Administered 2015-05-29: 140 ug/kg/min via INTRAVENOUS

## 2015-05-29 MED ORDER — LIDOCAINE HCL (CARDIAC) 20 MG/ML IV SOLN
INTRAVENOUS | Status: AC
  Filled 2015-05-29: qty 5

## 2015-05-29 MED ORDER — SODIUM CHLORIDE 0.9 % IV SOLN: INTRAVENOUS | Status: DC

## 2015-05-29 MED ORDER — PROPOFOL 500 MG/50ML IV EMUL
INTRAVENOUS | Status: DC | PRN
  Administered 2015-05-29 (×2): 20 mg via INTRAVENOUS

## 2015-05-29 MED ORDER — PROPOFOL 10 MG/ML IV BOLUS
INTRAVENOUS | Status: AC
  Filled 2015-05-29: qty 20

## 2015-05-29 MED ORDER — LACTATED RINGERS IV SOLN
INTRAVENOUS | Status: DC
  Administered 2015-05-29: 1000 mL via INTRAVENOUS

## 2015-05-29 SURGICAL SUPPLY — 22 items

## 2015-05-29 NOTE — Op Note (Signed)
Problem: Abdominal bloating. Small-volume hematochezia with normal hemoglobin. Urge and passive liquid fecal incontinence. History of adenomatous colon polyps. Normal surveillance colonoscopy performed on 10/12/2011  Endoscopist: Danise EdgeMartin Kolby Schara  Premedication: Propofol administered by anesthesia  Procedure: Colonoscopy The patient was placed in the left lateral decubitus position. Anal inspection revealed a prolapsed internal hemorrhoid with stigmata of recent bleeding. Digital rectal exam was normal. I performed a digital rectal examination in my office without anesthesia and felt her resting anal pressure and squeeze anal pressure were diminished. There were no defects palpable by digital rectal exam in the anal sphincter muscles. The Pentax pediatric colonoscope was introduced into the rectum and advanced to the cecum. A normal-appearing ileocecal valve and appendiceal orifice were identified. Colonic preparation for the exam today was good. Withdrawal time was 11 minutes  Universal colonic diverticulosis was present  Rectum. Normal. Retroflexed view of the distal rectum reveals large nonbleeding internal hemorrhoids  Sigmoid colon and descending colon. Normal  Splenic flexure. Normal  Transverse colon. Normal  Hepatic flexure. Normal  Ascending colon. Normal  Cecum and ileocecal valve. Normal  Assessment:  #1. Small-volume hematochezia secondary to internal hemorrhoids  #2. Universal colonic diverticulosis  #3. Urge and passive liquid fecal incontinence probably due to diminished anal sphincter tone  Recommendation: Schedule anorectal manometry to evaluate internal and external anal sphincter muscle function and assess rectal sensitivity.

## 2015-05-29 NOTE — Transfer of Care (Signed)
Immediate Anesthesia Transfer of Care Note  Patient: Toni Mcmillan  Procedure(s) Performed: Procedure(s): COLONOSCOPY WITH PROPOFOL (N/A)  Patient Location: PACU  Anesthesia Type:MAC  Level of Consciousness: awake, alert  and oriented  Airway & Oxygen Therapy: Patient Spontanous Breathing and Patient connected to face mask oxygen  Post-op Assessment: Report given to RN and Post -op Vital signs reviewed and stable  Post vital signs: Reviewed and stable  Last Vitals:  Filed Vitals:   05/29/15 1037  BP: 127/79  Temp: 36.7 C  Resp: 21    Complications: No apparent anesthesia complications

## 2015-05-29 NOTE — H&P (Signed)
  Problems: Abdominal bloating. Hematochezia. Fecal incontinence. 10/12/2011 normal surveillance colonoscopy  History: The patient is an 81 year old female born 03/01/1931. She has undergone colonoscopic exams in the past to remove adenomatous colon polyps. In November 2012, she underwent a normal surveillance colonoscopy.  For approximately 2 years, the patient has experienced urge incontinence and passive incontinence of liquid but not solid stool. Her liquid fecal incontinence has progressively worsened over the past 2 years. She denies chronic diarrhea and is more apt to have constipation. Recently she has had episodes of small volume hematochezia. A few weeks ago, the patient had severe right lower quadrant abdominal discomfort each has decreased but not completely gone away.  The patient has undergone hemorrhoid surgery to treat both internal hemorrhoids and external hemorrhoids in the past. She has had 4 vaginal deliveries. There is no history of pelvic radiation, diabetes, or other neurological disturbance.  Her CBC and complete metabolic profile were normal.  She is scheduled to undergo diagnostic colonoscopy today.  Past medical history: Hypertension. Tonsillectomy. Varicose vein stripping. Hemorrhoidectomy. Hysterectomy. Bilateral salpingo-oophorectomy.  Medication allergies: Cleocin. Demerol. Morphine. Erythromycin.  Exam: The patient is alert and lying comfortably on the endoscopy stretcher. Abdomen is soft and nontender to palpation. Lungs are clear to auscultation. Cardiac exam reveals a regular rhythm.  Plan: Proceed with diagnostic colonoscopy

## 2015-05-29 NOTE — Anesthesia Postprocedure Evaluation (Signed)
  Anesthesia Post-op Note  Patient: Toni Mcmillan  Procedure(s) Performed: Procedure(s) (LRB): COLONOSCOPY WITH PROPOFOL (N/A)  Patient Location: PACU  Anesthesia Type: MAC  Level of Consciousness: awake and alert   Airway and Oxygen Therapy: Patient Spontanous Breathing  Post-op Pain: mild  Post-op Assessment: Post-op Vital signs reviewed, Patient's Cardiovascular Status Stable, Respiratory Function Stable, Patent Airway and No signs of Nausea or vomiting  Last Vitals:  Filed Vitals:   05/29/15 1253  BP: 128/66  Pulse: 71  Temp: 36.7 C  Resp: 18    Post-op Vital Signs: stable   Complications: No apparent anesthesia complications

## 2015-05-29 NOTE — Anesthesia Preprocedure Evaluation (Addendum)
Anesthesia Evaluation  Patient identified by MRN, date of birth, ID band Patient awake    Reviewed: Allergy & Precautions, NPO status , Patient's Chart, lab work & pertinent test results  Airway Mallampati: II  TM Distance: >3 FB Neck ROM: Full    Dental no notable dental hx.    Pulmonary neg pulmonary ROS,  breath sounds clear to auscultation  Pulmonary exam normal       Cardiovascular hypertension, Pt. on medications and Pt. on home beta blockers Normal cardiovascular examRhythm:Regular Rate:Normal     Neuro/Psych negative neurological ROS  negative psych ROS   GI/Hepatic negative GI ROS, Neg liver ROS,   Endo/Other  diabetes  Renal/GU negative Renal ROS  negative genitourinary   Musculoskeletal negative musculoskeletal ROS (+)   Abdominal   Peds negative pediatric ROS (+)  Hematology negative hematology ROS (+)   Anesthesia Other Findings   Reproductive/Obstetrics negative OB ROS                            Anesthesia Physical Anesthesia Plan  ASA: II  Anesthesia Plan: MAC   Post-op Pain Management:    Induction: Intravenous  Airway Management Planned: Simple Face Mask  Additional Equipment:   Intra-op Plan:   Post-operative Plan:   Informed Consent: I have reviewed the patients History and Physical, chart, labs and discussed the procedure including the risks, benefits and alternatives for the proposed anesthesia with the patient or authorized representative who has indicated his/her understanding and acceptance.   Dental advisory given  Plan Discussed with: Surgeon and CRNA  Anesthesia Plan Comments:         Anesthesia Quick Evaluation

## 2015-05-30 ENCOUNTER — Encounter (HOSPITAL_COMMUNITY): Payer: Self-pay | Admitting: Gastroenterology

## 2015-06-04 ENCOUNTER — Ambulatory Visit (INDEPENDENT_AMBULATORY_CARE_PROVIDER_SITE_OTHER): Payer: Medicare Other | Admitting: Internal Medicine

## 2015-06-04 ENCOUNTER — Encounter: Payer: Self-pay | Admitting: Internal Medicine

## 2015-06-04 ENCOUNTER — Other Ambulatory Visit (INDEPENDENT_AMBULATORY_CARE_PROVIDER_SITE_OTHER): Payer: Medicare Other

## 2015-06-04 VITALS — BP 118/78 | HR 65 | Ht 63.0 in | Wt 121.0 lb

## 2015-06-04 DIAGNOSIS — R202 Paresthesia of skin: Secondary | ICD-10-CM

## 2015-06-04 DIAGNOSIS — E1140 Type 2 diabetes mellitus with diabetic neuropathy, unspecified: Secondary | ICD-10-CM

## 2015-06-04 DIAGNOSIS — K921 Melena: Secondary | ICD-10-CM

## 2015-06-04 LAB — HEPATIC FUNCTION PANEL
ALK PHOS: 68 U/L (ref 39–117)
ALT: 13 U/L (ref 0–35)
AST: 23 U/L (ref 0–37)
Albumin: 4.1 g/dL (ref 3.5–5.2)
Bilirubin, Direct: 0.1 mg/dL (ref 0.0–0.3)
TOTAL PROTEIN: 7.1 g/dL (ref 6.0–8.3)
Total Bilirubin: 0.3 mg/dL (ref 0.2–1.2)

## 2015-06-04 LAB — TSH: TSH: 1.08 u[IU]/mL (ref 0.35–4.50)

## 2015-06-04 LAB — BASIC METABOLIC PANEL
BUN: 11 mg/dL (ref 6–23)
CO2: 30 meq/L (ref 19–32)
CREATININE: 0.66 mg/dL (ref 0.40–1.20)
Calcium: 9.7 mg/dL (ref 8.4–10.5)
Chloride: 104 mEq/L (ref 96–112)
GFR: 91.08 mL/min (ref >60.00)
Glucose, Bld: 112 mg/dL — ABNORMAL HIGH (ref 70–99)
Potassium: 4.3 mEq/L (ref 3.5–5.1)
Sodium: 141 mEq/L (ref 135–145)

## 2015-06-04 LAB — VITAMIN B12: Vitamin B-12: 507 pg/mL (ref 211–911)

## 2015-06-04 LAB — HEMOGLOBIN A1C: Hgb A1c MFr Bld: 6.2 % (ref 4.6–6.5)

## 2015-06-04 LAB — SEDIMENTATION RATE: Sed Rate: 34 mm/hr — ABNORMAL HIGH (ref 0–22)

## 2015-06-04 MED ORDER — GABAPENTIN 100 MG PO CAPS: 100.0000 mg | ORAL_CAPSULE | Freq: Two times a day (BID) | ORAL | Status: DC | PRN

## 2015-06-04 NOTE — Assessment & Plan Note (Signed)
no relapse; just had a colonoscopy by Dr Laural BenesJohnson

## 2015-06-04 NOTE — Progress Notes (Signed)
Pre visit review using our clinic review tool, if applicable. No additional management support is needed unless otherwise documented below in the visit note. 

## 2015-06-04 NOTE — Patient Instructions (Signed)

## 2015-06-04 NOTE — Assessment & Plan Note (Addendum)
Diet controlled - no medications Labs

## 2015-06-04 NOTE — Assessment & Plan Note (Addendum)
2016 - poss neuropathy of ?etiology Labs Gabapentin prn

## 2015-06-04 NOTE — Progress Notes (Signed)
Subjective:  Patient ID: Toni Mcmillan, female    DOB: 03-08-1931  Age: 81 y.o. MRN: [id number]  CC: No chief complaint on file.   HPI   KIKUE GERHART presents for pins and needles in the feet x 12 months. C/o burning in the feet, cold sensation. F/u blood in stool - no relapse; just had a colonoscopy by Dr Laural Benes   Outpatient Prescriptions Prior to Visit  Medication Sig Dispense Refill  . aspirin EC 81 MG tablet Take 81 mg by mouth at bedtime.     Marland Kitchen aspirin-acetaminophen-caffeine (EXCEDRIN MIGRAINE) 250-250-65 MG per tablet Take 1-2 tablets by mouth every 6 (six) hours as needed for headache.    . Calcium Carbonate-Vit D-Min (CALCIUM 1200 PO) Take 1 tablet by mouth every morning.     . Cholecalciferol 1000 UNITS tablet Take 1,000 Units by mouth every morning.     . metoprolol (LOPRESSOR) 50 MG tablet TAKE ONE AND ONE-HALF TABLETS BY MOUTH TWICE DAILY (Patient taking differently: Take 25 mg by mouth 2 (two) times daily. ) 270 tablet 1  . metroNIDAZOLE (FLAGYL) 500 MG tablet Take 1 tablet (500 mg total) by mouth 3 (three) times daily. 30 tablet 0  . Multiple Vitamins-Minerals (MULTIVITAMIN,TX-MINERALS) tablet Take 1 tablet by mouth every morning.     . Omega-3 Fatty Acids (FISH OIL PO) Take 1 tablet by mouth every morning.    . Polyvinyl Alcohol-Povidone (REFRESH OP) Apply 1-2 drops to eye daily as needed (dry eyes).    . Probiotic Product (ALIGN) 4 MG CAPS Take 1 capsule by mouth daily. 30 capsule 11  . ramipril (ALTACE) 2.5 MG capsule TAKE ONE CAPSULE BY MOUTH DAILY 90 capsule 3  . simvastatin (ZOCOR) 40 MG tablet Take 1 tablet (40 mg total) by mouth at bedtime. 90 tablet 3  . vitamin C (ASCORBIC ACID) 500 MG tablet Take 500 mg by mouth every morning.     . Wheat Dextrin (BENEFIBER PO) Take 10 mLs by mouth daily.    Marland Kitchen zolpidem (AMBIEN) 10 MG tablet TAKE 1 TABLET BY MOUTH AT BEDTIME AS NEEDED FOR SLEEP (Patient taking differently: Take 3 mg by mouth at bedtime as needed for  sleep. ) 30 tablet 3   No facility-administered medications prior to visit.    ROS Review of Systems  Objective:  BP 118/78 mmHg  Pulse 65  Ht  (1.6 m)  Wt 121 lb (54.885 kg)  BMI 21.44 kg/m2  SpO2 97%  BP Readings from Last 3 Encounters:  06/04/15 118/78  05/29/15 164/71  04/23/15 140/86    Wt Readings from Last 3 Encounters:  06/04/15 121 lb (54.885 kg)  05/29/15 117 lb (53.071 kg)  04/23/15 124 lb (56.246 kg)    Physical Exam  Lab Results  Component Value Date   WBC 8.0 04/23/2015   HGB 12.5 04/23/2015   HCT 38.0 04/23/2015   PLT 338.0 04/23/2015   GLUCOSE 104* 04/23/2015   CHOL 110 07/12/2014   TRIG 99.0 07/12/2014   HDL 50.90 07/12/2014   LDLCALC 39 07/12/2014   ALT 11 04/23/2015   AST 20 04/23/2015   NA 138 04/23/2015   K 4.7 04/23/2015   CL 104 04/23/2015   CREATININE 0.70 04/23/2015   BUN 12 04/23/2015   CO2 30 04/23/2015   TSH 1.23 04/23/2015   HGBA1C 6.6* 07/12/2014    No results found.  Assessment & Plan:   Diagnoses and all orders for this visit:  Blood in stool  Paresthesia  of both feet  Type 2 diabetes, controlled, with neuropathy  Other orders -     gabapentin (NEURONTIN) 100 MG capsule; Take 1 capsule (100 mg total) by mouth 2 (two) times daily as needed (neuropathy).  I am having Ms. Moren start on gabapentin. I am also having her maintain her vitamin C, (multivitamin,tx-minerals), Calcium Carbonate-Vit D-Min (CALCIUM 1200 PO), aspirin EC, Cholecalciferol, metoprolol, simvastatin, ramipril, zolpidem, metroNIDAZOLE, ALIGN, Wheat Dextrin (BENEFIBER PO), Omega-3 Fatty Acids (FISH OIL PO), aspirin-acetaminophen-caffeine, Polyvinyl Alcohol-Povidone (REFRESH OP), and GAVILYTE-N WITH FLAVOR PACK.  Meds ordered this encounter  Medications  . GAVILYTE-N WITH FLAVOR PACK 420 G solution    Sig:     Refill:  0  . gabapentin (NEURONTIN) 100 MG capsule    Sig: Take 1 capsule (100 mg total) by mouth 2 (two) times daily as needed  (neuropathy).    Dispense:  60 capsule    Refill:  5     Follow-up: No Follow-up on file.  Sonda PrimesAlex Cordarrius Coad, MD

## 2015-06-06 LAB — PROTEIN ELECTROPHORESIS, SERUM
ALBUMIN ELP: 4.1 g/dL (ref 3.8–4.8)
ALPHA-2-GLOBULIN: 0.9 g/dL (ref 0.5–0.9)
Alpha-1-Globulin: 0.3 g/dL (ref 0.2–0.3)
Beta 2: 0.4 g/dL (ref 0.2–0.5)
Beta Globulin: 0.4 g/dL (ref 0.4–0.6)
Gamma Globulin: 1 g/dL (ref 0.8–1.7)
Total Protein, Serum Electrophoresis: 7 g/dL (ref 6.1–8.1)

## 2015-07-07 ENCOUNTER — Ambulatory Visit (HOSPITAL_COMMUNITY)
Admission: RE | Admit: 2015-07-07 | Discharge: 2015-07-07 | Disposition: A | Discharge status: Home / Self Care | Payer: Medicare Other | Source: Ambulatory Visit | Attending: Gastroenterology | Admitting: Gastroenterology

## 2015-07-07 ENCOUNTER — Encounter (HOSPITAL_COMMUNITY)
Admission: RE | Disposition: A | Discharge status: Home / Self Care | Payer: Self-pay | Source: Ambulatory Visit | Attending: Gastroenterology

## 2015-07-07 DIAGNOSIS — K648 Other hemorrhoids: Secondary | ICD-10-CM | POA: Diagnosis not present

## 2015-07-07 DIAGNOSIS — R159 Full incontinence of feces: Secondary | ICD-10-CM | POA: Insufficient documentation

## 2015-07-07 DIAGNOSIS — K5730 Diverticulosis of large intestine without perforation or abscess without bleeding: Secondary | ICD-10-CM | POA: Insufficient documentation

## 2015-07-07 DIAGNOSIS — I10 Essential (primary) hypertension: Secondary | ICD-10-CM | POA: Diagnosis not present

## 2015-07-07 HISTORY — PX: ANAL RECTAL MANOMETRY: SHX6358

## 2015-07-07 SURGERY — MANOMETRY, ANORECTAL

## 2015-07-07 NOTE — H&P (Signed)
  Procedure: Diagnostic anorectal manometry to evaluate fecal incontinence. 05/29/2015 normal diagnostic colonoscopy to evaluate hematochezia except for the presence of large internal hemorrhoids  History: The patient is a 81 year old female born Jan 06, 1931. She has intermittent, small-volume liquid incontinence. On 7/40/2016 she underwent a diagnostic colonoscopy which was normal except for the presence of universal colonic diverticulosis and large internal hemorrhoids.  She is scheduled to undergo diagnostic anorectal manometry.  Medication allergies: Cleocin. Demerol. Morphine. Erythromycin.  Past medical history: Total abdominal hysterectomy. BSO. Hammer toe surgery. Hemorrhoidectomy. Varicose vein stripping surgery. Tonsillectomy. Hypertension. Seasonal allergies.  Plan: Proceed with diagnostic anorectal manometry

## 2015-07-08 ENCOUNTER — Encounter (HOSPITAL_COMMUNITY): Payer: Self-pay | Admitting: Gastroenterology

## 2015-07-16 ENCOUNTER — Ambulatory Visit (INDEPENDENT_AMBULATORY_CARE_PROVIDER_SITE_OTHER): Payer: Medicare Other | Admitting: Internal Medicine

## 2015-07-16 ENCOUNTER — Encounter: Payer: Self-pay | Admitting: Internal Medicine

## 2015-07-16 VITALS — BP 160/90 | HR 67 | Wt 121.0 lb

## 2015-07-16 DIAGNOSIS — E1140 Type 2 diabetes mellitus with diabetic neuropathy, unspecified: Secondary | ICD-10-CM

## 2015-07-16 DIAGNOSIS — Z23 Encounter for immunization: Secondary | ICD-10-CM

## 2015-07-16 DIAGNOSIS — R202 Paresthesia of skin: Secondary | ICD-10-CM

## 2015-07-16 DIAGNOSIS — I10 Essential (primary) hypertension: Secondary | ICD-10-CM

## 2015-07-16 DIAGNOSIS — R159 Full incontinence of feces: Secondary | ICD-10-CM

## 2015-07-16 NOTE — Assessment & Plan Note (Signed)
Metoprolol, Ramipril Labs

## 2015-07-16 NOTE — Assessment & Plan Note (Signed)
Better  

## 2015-07-16 NOTE — Assessment & Plan Note (Signed)
6/16 w/gas passing Dr Laural Benes: stool incontinence (s/p colonoscopy and rectal manometry)

## 2015-07-16 NOTE — Progress Notes (Signed)
Pre visit review using our clinic review tool, if applicable. No additional management support is needed unless otherwise documented below in the visit note. 

## 2015-07-16 NOTE — Progress Notes (Signed)
Subjective:  Patient ID: Toni Mcmillan, female    DOB: 1931-08-30  Age: 81 y.o. MRN: [id number]  CC: No chief complaint on file.   HPI Toni Mcmillan presents for HTN, stool incontinence (s/p colonoscopy and rectal manometry). C/o neck pain  Outpatient Prescriptions Prior to Visit  Medication Sig Dispense Refill  . aspirin EC 81 MG tablet Take 81 mg by mouth at bedtime.     Marland Kitchen aspirin-acetaminophen-caffeine (EXCEDRIN MIGRAINE) 250-250-65 MG per tablet Take 1-2 tablets by mouth every 6 (six) hours as needed for headache.    . Calcium Carbonate-Vit D-Min (CALCIUM 1200 PO) Take 1 tablet by mouth every morning.     . Cholecalciferol 1000 UNITS tablet Take 1,000 Units by mouth every morning.     . gabapentin (NEURONTIN) 100 MG capsule Take 1 capsule (100 mg total) by mouth 2 (two) times daily as needed (neuropathy). 60 capsule 5  . GAVILYTE-N WITH FLAVOR PACK 420 G solution   0  . metoprolol (LOPRESSOR) 50 MG tablet TAKE ONE AND ONE-HALF TABLETS BY MOUTH TWICE DAILY (Patient taking differently: Take 25 mg by mouth 2 (two) times daily. ) 270 tablet 1  . metroNIDAZOLE (FLAGYL) 500 MG tablet Take 1 tablet (500 mg total) by mouth 3 (three) times daily. 30 tablet 0  . Multiple Vitamins-Minerals (MULTIVITAMIN,TX-MINERALS) tablet Take 1 tablet by mouth every morning.     . Omega-3 Fatty Acids (FISH OIL PO) Take 1 tablet by mouth every morning.    . Polyvinyl Alcohol-Povidone (REFRESH OP) Apply 1-2 drops to eye daily as needed (dry eyes).    . Probiotic Product (ALIGN) 4 MG CAPS Take 1 capsule by mouth daily. 30 capsule 11  . ramipril (ALTACE) 2.5 MG capsule TAKE ONE CAPSULE BY MOUTH DAILY 90 capsule 3  . simvastatin (ZOCOR) 40 MG tablet Take 1 tablet (40 mg total) by mouth at bedtime. 90 tablet 3  . vitamin C (ASCORBIC ACID) 500 MG tablet Take 500 mg by mouth every morning.     . Wheat Dextrin (BENEFIBER PO) Take 10 mLs by mouth daily.    Marland Kitchen zolpidem (AMBIEN) 10 MG tablet TAKE 1 TABLET BY MOUTH  AT BEDTIME AS NEEDED FOR SLEEP (Patient taking differently: Take 3 mg by mouth at bedtime as needed for sleep. ) 30 tablet 3   No facility-administered medications prior to visit.    ROS Review of Systems  Constitutional: Negative for chills, activity change, appetite change, fatigue and unexpected weight change.  HENT: Negative for congestion, mouth sores and sinus pressure.   Eyes: Negative for visual disturbance.  Respiratory: Negative for cough and chest tightness.   Gastrointestinal: Negative for nausea and abdominal pain.  Genitourinary: Negative for frequency, difficulty urinating and vaginal pain.  Musculoskeletal: Negative for back pain and gait problem.  Skin: Negative for pallor and rash.  Neurological: Negative for dizziness, tremors, weakness, numbness and headaches.  Psychiatric/Behavioral: Negative for suicidal ideas, confusion and sleep disturbance.    Objective:  BP 160/90 mmHg  Pulse 67  Wt 121 lb (54.885 kg)  SpO2 97%  BP Readings from Last 3 Encounters:  07/16/15 160/90  06/04/15 118/78  05/29/15 164/71    Wt Readings from Last 3 Encounters:  07/16/15 121 lb (54.885 kg)  06/04/15 121 lb (54.885 kg)  05/29/15 117 lb (53.071 kg)    Physical Exam  Constitutional: She appears well-developed. No distress.  HENT:  Head: Normocephalic.  Right Ear: External ear normal.  Left Ear: External ear normal.  Nose:  Nose normal.  Mouth/Throat: Oropharynx is clear and moist.  Eyes: Conjunctivae are normal. Pupils are equal, round, and reactive to light. Right eye exhibits no discharge. Left eye exhibits no discharge.  Neck: Normal range of motion. Neck supple. No JVD present. No tracheal deviation present. No thyromegaly present.  Cardiovascular: Normal rate, regular rhythm and normal heart sounds.   Pulmonary/Chest: No stridor. No respiratory distress. She has no wheezes.  Abdominal: Soft. Bowel sounds are normal. She exhibits no distension and no mass. There is no  tenderness. There is no rebound and no guarding.  Musculoskeletal: She exhibits no edema or tenderness.  Lymphadenopathy:    She has no cervical adenopathy.  Neurological: She displays normal reflexes. No cranial nerve deficit. She exhibits normal muscle tone. Coordination normal.  Skin: No rash noted. No erythema.  Psychiatric: She has a normal mood and affect. Her behavior is normal. Judgment and thought content normal.  tender in occipital areas B   Lab Results  Component Value Date   WBC 8.0 04/23/2015   HGB 12.5 04/23/2015   HCT 38.0 04/23/2015   PLT 338.0 04/23/2015   GLUCOSE 112* 06/04/2015   CHOL 110 07/12/2014   TRIG 99.0 07/12/2014   HDL 50.90 07/12/2014   LDLCALC 39 07/12/2014   ALT 13 06/04/2015   AST 23 06/04/2015   NA 141 06/04/2015   K 4.3 06/04/2015   CL 104 06/04/2015   CREATININE 0.66 06/04/2015   BUN 11 06/04/2015   CO2 30 06/04/2015   TSH 1.08 06/04/2015   HGBA1C 6.2 06/04/2015    No results found.  Assessment & Plan:   Diagnoses and all orders for this visit:  Stool incontinence  Type 2 diabetes, controlled, with neuropathy  Essential hypertension  Need for prophylactic vaccination against Streptococcus pneumoniae (pneumococcus) -     Pneumococcal polysaccharide vaccine 23-valent greater than or equal to 2yo subcutaneous/IM  I am having Ms. Woods maintain her vitamin C, (multivitamin,tx-minerals), Calcium Carbonate-Vit D-Min (CALCIUM 1200 PO), aspirin EC, Cholecalciferol, metoprolol, simvastatin, ramipril, zolpidem, metroNIDAZOLE, ALIGN, Wheat Dextrin (BENEFIBER PO), Omega-3 Fatty Acids (FISH OIL PO), aspirin-acetaminophen-caffeine, Polyvinyl Alcohol-Povidone (REFRESH OP), GAVILYTE-N WITH FLAVOR PACK, and gabapentin.  No orders of the defined types were placed in this encounter.     Follow-up: Return in about 4 months (around 11/15/2015) for a follow-up visit.  Sonda Primes, MD

## 2015-07-17 ENCOUNTER — Encounter: Payer: Self-pay | Admitting: Internal Medicine

## 2015-07-17 NOTE — Assessment & Plan Note (Signed)
Better on Gabapentin 

## 2015-07-20 ENCOUNTER — Other Ambulatory Visit: Payer: Self-pay | Admitting: Internal Medicine

## 2015-07-24 ENCOUNTER — Ambulatory Visit (INDEPENDENT_AMBULATORY_CARE_PROVIDER_SITE_OTHER): Payer: Medicare Other

## 2015-07-24 DIAGNOSIS — Z23 Encounter for immunization: Secondary | ICD-10-CM | POA: Diagnosis not present

## 2015-07-27 ENCOUNTER — Other Ambulatory Visit: Payer: Self-pay | Admitting: Internal Medicine

## 2015-07-28 ENCOUNTER — Other Ambulatory Visit: Payer: Self-pay

## 2015-07-28 MED ORDER — METOPROLOL TARTRATE 50 MG PO TABS: ORAL_TABLET | ORAL | Status: DC

## 2015-08-17 ENCOUNTER — Other Ambulatory Visit: Payer: Self-pay | Admitting: Internal Medicine

## 2015-09-11 DIAGNOSIS — E113291 Type 2 diabetes mellitus with mild nonproliferative diabetic retinopathy without macular edema, right eye: Secondary | ICD-10-CM | POA: Insufficient documentation

## 2015-10-16 ENCOUNTER — Other Ambulatory Visit: Payer: Self-pay

## 2015-10-17 NOTE — Telephone Encounter (Signed)
Ok to refill 

## 2015-10-19 MED ORDER — ZOLPIDEM TARTRATE 10 MG PO TABS: ORAL_TABLET | ORAL | Status: DC

## 2015-11-19 ENCOUNTER — Encounter: Payer: Self-pay | Admitting: Internal Medicine

## 2015-11-19 ENCOUNTER — Ambulatory Visit (INDEPENDENT_AMBULATORY_CARE_PROVIDER_SITE_OTHER): Payer: Medicare Other | Admitting: Internal Medicine

## 2015-11-19 ENCOUNTER — Other Ambulatory Visit (INDEPENDENT_AMBULATORY_CARE_PROVIDER_SITE_OTHER): Payer: Medicare Other

## 2015-11-19 ENCOUNTER — Encounter: Payer: Medicare Other | Admitting: Internal Medicine

## 2015-11-19 VITALS — BP 139/85 | HR 59 | Ht 61.5 in | Wt 125.0 lb

## 2015-11-19 DIAGNOSIS — K5901 Slow transit constipation: Secondary | ICD-10-CM

## 2015-11-19 DIAGNOSIS — E785 Hyperlipidemia, unspecified: Secondary | ICD-10-CM

## 2015-11-19 DIAGNOSIS — E1140 Type 2 diabetes mellitus with diabetic neuropathy, unspecified: Secondary | ICD-10-CM

## 2015-11-19 DIAGNOSIS — G4700 Insomnia, unspecified: Secondary | ICD-10-CM

## 2015-11-19 DIAGNOSIS — Z0000 Encounter for general adult medical examination without abnormal findings: Secondary | ICD-10-CM

## 2015-11-19 DIAGNOSIS — K5900 Constipation, unspecified: Secondary | ICD-10-CM | POA: Insufficient documentation

## 2015-11-19 LAB — CBC WITH DIFFERENTIAL/PLATELET
Basophils Absolute: 0 10*3/uL (ref 0.0–0.1)
Basophils Relative: 0.4 % (ref 0.0–3.0)
EOS PCT: 3.1 % (ref 0.0–5.0)
Eosinophils Absolute: 0.3 10*3/uL (ref 0.0–0.7)
HCT: 37.7 % (ref 36.0–46.0)
Hemoglobin: 12.3 g/dL (ref 12.0–15.0)
LYMPHS ABS: 3.2 10*3/uL (ref 0.7–4.0)
Lymphocytes Relative: 36.6 % (ref 12.0–46.0)
MCHC: 32.6 g/dL (ref 30.0–36.0)
MCV: 90.8 fl (ref 78.0–100.0)
MONO ABS: 0.6 10*3/uL (ref 0.1–1.0)
MONOS PCT: 6.4 % (ref 3.0–12.0)
NEUTROS PCT: 53.5 % (ref 43.0–77.0)
Neutro Abs: 4.6 10*3/uL (ref 1.4–7.7)
Platelets: 306 10*3/uL (ref 150.0–400.0)
RBC: 4.15 Mil/uL (ref 3.87–5.11)
RDW: 13.2 % (ref 11.5–15.5)
WBC: 8.6 10*3/uL (ref 4.0–10.5)

## 2015-11-19 LAB — HEPATIC FUNCTION PANEL
ALBUMIN: 4.2 g/dL (ref 3.5–5.2)
ALT: 13 U/L (ref 0–35)
AST: 21 U/L (ref 0–37)
Alkaline Phosphatase: 51 U/L (ref 39–117)
Bilirubin, Direct: 0.1 mg/dL (ref 0.0–0.3)
Total Bilirubin: 0.5 mg/dL (ref 0.2–1.2)
Total Protein: 7.1 g/dL (ref 6.0–8.3)

## 2015-11-19 LAB — URINALYSIS, ROUTINE W REFLEX MICROSCOPIC
BILIRUBIN URINE: NEGATIVE
Hgb urine dipstick: NEGATIVE
KETONES UR: NEGATIVE
NITRITE: NEGATIVE
Specific Gravity, Urine: >=1.03 — AB (ref 1.000–1.030)
Total Protein, Urine: NEGATIVE
Urine Glucose: NEGATIVE
Urobilinogen, UA: 0.2 (ref 0.0–1.0)
pH: 5.5 (ref 5.0–8.0)

## 2015-11-19 LAB — BASIC METABOLIC PANEL
BUN: 16 mg/dL (ref 6–23)
CALCIUM: 9.6 mg/dL (ref 8.4–10.5)
CO2: 29 mEq/L (ref 19–32)
Chloride: 106 mEq/L (ref 96–112)
Creatinine, Ser: 0.71 mg/dL (ref 0.40–1.20)
GFR: 83.62 mL/min (ref >60.00)
GLUCOSE: 110 mg/dL — AB (ref 70–99)
POTASSIUM: 4.5 meq/L (ref 3.5–5.1)
SODIUM: 142 meq/L (ref 135–145)

## 2015-11-19 LAB — LIPID PANEL
CHOLESTEROL: 157 mg/dL (ref 0–200)
HDL: 50.2 mg/dL (ref >39.00)
LDL Cholesterol: 83 mg/dL (ref 0–99)
NonHDL: 107.14
Total CHOL/HDL Ratio: 3
Triglycerides: 119 mg/dL (ref 0.0–149.0)
VLDL: 23.8 mg/dL (ref 0.0–40.0)

## 2015-11-19 LAB — TSH: TSH: 0.83 u[IU]/mL (ref 0.35–4.50)

## 2015-11-19 LAB — HEMOGLOBIN A1C: HEMOGLOBIN A1C: 6.3 % (ref 4.6–6.5)

## 2015-11-19 MED ORDER — LUBIPROSTONE 24 MCG PO CAPS: 24.0000 ug | ORAL_CAPSULE | Freq: Two times a day (BID) | ORAL | Status: DC

## 2015-11-19 NOTE — Assessment & Plan Note (Signed)
Zolpidem prn  Potential benefits of a long term benzodiazepines  use as well as potential risks  and complications were explained to the patient and were aknowledged. 

## 2015-11-19 NOTE — Progress Notes (Signed)
Pre visit review using our clinic review tool, if applicable. No additional management support is needed unless otherwise documented below in the visit note. 

## 2015-11-19 NOTE — Assessment & Plan Note (Signed)
Simvastatin 

## 2015-11-19 NOTE — Patient Instructions (Signed)
Preventive Care for Adults, Female A healthy lifestyle and preventive care can promote health and wellness. Preventive health guidelines for women include the following key practices.  A routine yearly physical is a good way to check with your health care provider about your health and preventive screening. It is a chance to share any concerns and updates on your health and to receive a thorough exam.  Visit your dentist for a routine exam and preventive care every 6 months. Brush your teeth twice a day and floss once a day. Good oral hygiene prevents tooth decay and gum disease.  The frequency of eye exams is based on your age, health, family medical history, use of contact lenses, and other factors. Follow your health care provider's recommendations for frequency of eye exams.  Eat a healthy diet. Foods like vegetables, fruits, whole grains, low-fat dairy products, and lean protein foods contain the nutrients you need without too many calories. Decrease your intake of foods high in solid fats, added sugars, and salt. Eat the right amount of calories for you.Get information about a proper diet from your health care provider, if necessary.  Regular physical exercise is one of the most important things you can do for your health. Most adults should get at least 150 minutes of moderate-intensity exercise (any activity that increases your heart rate and causes you to sweat) each week. In addition, most adults need muscle-strengthening exercises on 2 or more days a week.  Maintain a healthy weight. The body mass index (BMI) is a screening tool to identify possible weight problems. It provides an estimate of body fat based on height and weight. Your health care provider can find your BMI and can help you achieve or maintain a healthy weight.For adults 20 years and older:  A BMI below 18.5 is considered underweight.  A BMI of 18.5 to 24.9 is normal.  A BMI of 25 to 29.9 is considered overweight.  A  BMI of 30 and above is considered obese.  Maintain normal blood lipids and cholesterol levels by exercising and minimizing your intake of saturated fat. Eat a balanced diet with plenty of fruit and vegetables. Blood tests for lipids and cholesterol should begin at age 45 and be repeated every 5 years. If your lipid or cholesterol levels are high, you are over 50, or you are at high risk for heart disease, you may need your cholesterol levels checked more frequently.Ongoing high lipid and cholesterol levels should be treated with medicines if diet and exercise are not working.  If you smoke, find out from your health care provider how to quit. If you do not use tobacco, do not start.  Lung cancer screening is recommended for adults aged 45-80 years who are at high risk for developing lung cancer because of a history of smoking. A yearly low-dose CT scan of the lungs is recommended for people who have at least a 30-pack-year history of smoking and are a current smoker or have quit within the past 15 years. A pack year of smoking is smoking an average of 1 pack of cigarettes a day for 1 year (for example: 1 pack a day for 30 years or 2 packs a day for 15 years). Yearly screening should continue until the smoker has stopped smoking for at least 15 years. Yearly screening should be stopped for people who develop a health problem that would prevent them from having lung cancer treatment.  If you are pregnant, do not drink alcohol. If you are  breastfeeding, be very cautious about drinking alcohol. If you are not pregnant and choose to drink alcohol, do not have more than 1 drink per day. One drink is considered to be 12 ounces (355 mL) of beer, 5 ounces (148 mL) of wine, or 1.5 ounces (44 mL) of liquor.  Avoid use of street drugs. Do not share needles with anyone. Ask for help if you need support or instructions about stopping the use of drugs.  High blood pressure causes heart disease and increases the risk  of stroke. Your blood pressure should be checked at least every 1 to 2 years. Ongoing high blood pressure should be treated with medicines if weight loss and exercise do not work.  If you are 55-79 years old, ask your health care provider if you should take aspirin to prevent strokes.  Diabetes screening is done by taking a blood sample to check your blood glucose level after you have not eaten for a certain period of time (fasting). If you are not overweight and you do not have risk factors for diabetes, you should be screened once every 3 years starting at age 45. If you are overweight or obese and you are 40-70 years of age, you should be screened for diabetes every year as part of your cardiovascular risk assessment.  Breast cancer screening is essential preventive care for women. You should practice "breast self-awareness." This means understanding the normal appearance and feel of your breasts and may include breast self-examination. Any changes detected, no matter how small, should be reported to a health care provider. Women in their 20s and 30s should have a clinical breast exam (CBE) by a health care provider as part of a regular health exam every 1 to 3 years. After age 40, women should have a CBE every year. Starting at age 40, women should consider having a mammogram (breast X-ray test) every year. Women who have a family history of breast cancer should talk to their health care provider about genetic screening. Women at a high risk of breast cancer should talk to their health care providers about having an MRI and a mammogram every year.  Breast cancer gene (BRCA)-related cancer risk assessment is recommended for women who have family members with BRCA-related cancers. BRCA-related cancers include breast, ovarian, tubal, and peritoneal cancers. Having family members with these cancers may be associated with an increased risk for harmful changes (mutations) in the breast cancer genes BRCA1 and  BRCA2. Results of the assessment will determine the need for genetic counseling and BRCA1 and BRCA2 testing.  Your health care provider may recommend that you be screened regularly for cancer of the pelvic organs (ovaries, uterus, and vagina). This screening involves a pelvic examination, including checking for microscopic changes to the surface of your cervix (Pap test). You may be encouraged to have this screening done every 3 years, beginning at age 21.  For women ages 30-65, health care providers may recommend pelvic exams and Pap testing every 3 years, or they may recommend the Pap and pelvic exam, combined with testing for human papilloma virus (HPV), every 5 years. Some types of HPV increase your risk of cervical cancer. Testing for HPV may also be done on women of any age with unclear Pap test results.  Other health care providers may not recommend any screening for nonpregnant women who are considered low risk for pelvic cancer and who do not have symptoms. Ask your health care provider if a screening pelvic exam is right for   you.  If you have had past treatment for cervical cancer or a condition that could lead to cancer, you need Pap tests and screening for cancer for at least 20 years after your treatment. If Pap tests have been discontinued, your risk factors (such as having a new sexual partner) need to be reassessed to determine if screening should resume. Some women have medical problems that increase the chance of getting cervical cancer. In these cases, your health care provider may recommend more frequent screening and Pap tests.  Colorectal cancer can be detected and often prevented. Most routine colorectal cancer screening begins at the age of 50 years and continues through age 75 years. However, your health care provider may recommend screening at an earlier age if you have risk factors for colon cancer. On a yearly basis, your health care provider may provide home test kits to check  for hidden blood in the stool. Use of a small camera at the end of a tube, to directly examine the colon (sigmoidoscopy or colonoscopy), can detect the earliest forms of colorectal cancer. Talk to your health care provider about this at age 50, when routine screening begins. Direct exam of the colon should be repeated every 5-10 years through age 75 years, unless early forms of precancerous polyps or small growths are found.  People who are at an increased risk for hepatitis B should be screened for this virus. You are considered at high risk for hepatitis B if:  You were born in a country where hepatitis B occurs often. Talk with your health care provider about which countries are considered high risk.  Your parents were born in a high-risk country and you have not received a shot to protect against hepatitis B (hepatitis B vaccine).  You have HIV or AIDS.  You use needles to inject street drugs.  You live with, or have sex with, someone who has hepatitis B.  You get hemodialysis treatment.  You take certain medicines for conditions like cancer, organ transplantation, and autoimmune conditions.  Hepatitis C blood testing is recommended for all people born from 1945 through 1965 and any individual with known risks for hepatitis C.  Practice safe sex. Use condoms and avoid high-risk sexual practices to reduce the spread of sexually transmitted infections (STIs). STIs include gonorrhea, chlamydia, syphilis, trichomonas, herpes, HPV, and human immunodeficiency virus (HIV). Herpes, HIV, and HPV are viral illnesses that have no cure. They can result in disability, cancer, and death.  You should be screened for sexually transmitted illnesses (STIs) including gonorrhea and chlamydia if:  You are sexually active and are younger than 24 years.  You are older than 24 years and your health care provider tells you that you are at risk for this type of infection.  Your sexual activity has changed  since you were last screened and you are at an increased risk for chlamydia or gonorrhea. Ask your health care provider if you are at risk.  If you are at risk of being infected with HIV, it is recommended that you take a prescription medicine daily to prevent HIV infection. This is called preexposure prophylaxis (PrEP). You are considered at risk if:  You are sexually active and do not regularly use condoms or know the HIV status of your partner(s).  You take drugs by injection.  You are sexually active with a partner who has HIV.  Talk with your health care provider about whether you are at high risk of being infected with HIV. If   you choose to begin PrEP, you should first be tested for HIV. You should then be tested every 3 months for as long as you are taking PrEP.  Osteoporosis is a disease in which the bones lose minerals and strength with aging. This can result in serious bone fractures or breaks. The risk of osteoporosis can be identified using a bone density scan. Women ages 67 years and over and women at risk for fractures or osteoporosis should discuss screening with their health care providers. Ask your health care provider whether you should take a calcium supplement or vitamin D to reduce the rate of osteoporosis.  Menopause can be associated with physical symptoms and risks. Hormone replacement therapy is available to decrease symptoms and risks. You should talk to your health care provider about whether hormone replacement therapy is right for you.  Use sunscreen. Apply sunscreen liberally and repeatedly throughout the day. You should seek shade when your shadow is shorter than you. Protect yourself by wearing long sleeves, pants, a wide-brimmed hat, and sunglasses year round, whenever you are outdoors.  Once a month, do a whole body skin exam, using a mirror to look at the skin on your back. Tell your health care provider of new moles, moles that have irregular borders, moles that  are larger than a pencil eraser, or moles that have changed in shape or color.  Stay current with required vaccines (immunizations).  Influenza vaccine. All adults should be immunized every year.  Tetanus, diphtheria, and acellular pertussis (Td, Tdap) vaccine. Pregnant women should receive 1 dose of Tdap vaccine during each pregnancy. The dose should be obtained regardless of the length of time since the last dose. Immunization is preferred during the 27th-36th week of gestation. An adult who has not previously received Tdap or who does not know her vaccine status should receive 1 dose of Tdap. This initial dose should be followed by tetanus and diphtheria toxoids (Td) booster doses every 10 years. Adults with an unknown or incomplete history of completing a 3-dose immunization series with Td-containing vaccines should begin or complete a primary immunization series including a Tdap dose. Adults should receive a Td booster every 10 years.  Varicella vaccine. An adult without evidence of immunity to varicella should receive 2 doses or a second dose if she has previously received 1 dose. Pregnant females who do not have evidence of immunity should receive the first dose after pregnancy. This first dose should be obtained before leaving the health care facility. The second dose should be obtained 4-8 weeks after the first dose.  Human papillomavirus (HPV) vaccine. Females aged 13-26 years who have not received the vaccine previously should obtain the 3-dose series. The vaccine is not recommended for use in pregnant females. However, pregnancy testing is not needed before receiving a dose. If a female is found to be pregnant after receiving a dose, no treatment is needed. In that case, the remaining doses should be delayed until after the pregnancy. Immunization is recommended for any person with an immunocompromised condition through the age of 61 years if she did not get any or all doses earlier. During the  3-dose series, the second dose should be obtained 4-8 weeks after the first dose. The third dose should be obtained 24 weeks after the first dose and 16 weeks after the second dose.  Zoster vaccine. One dose is recommended for adults aged 30 years or older unless certain conditions are present.  Measles, mumps, and rubella (MMR) vaccine. Adults born  before 1957 generally are considered immune to measles and mumps. Adults born in 1957 or later should have 1 or more doses of MMR vaccine unless there is a contraindication to the vaccine or there is laboratory evidence of immunity to each of the three diseases. A routine second dose of MMR vaccine should be obtained at least 28 days after the first dose for students attending postsecondary schools, health care workers, or international travelers. People who received inactivated measles vaccine or an unknown type of measles vaccine during 1963-1967 should receive 2 doses of MMR vaccine. People who received inactivated mumps vaccine or an unknown type of mumps vaccine before 1979 and are at high risk for mumps infection should consider immunization with 2 doses of MMR vaccine. For females of childbearing age, rubella immunity should be determined. If there is no evidence of immunity, females who are not pregnant should be vaccinated. If there is no evidence of immunity, females who are pregnant should delay immunization until after pregnancy. Unvaccinated health care workers born before 1957 who lack laboratory evidence of measles, mumps, or rubella immunity or laboratory confirmation of disease should consider measles and mumps immunization with 2 doses of MMR vaccine or rubella immunization with 1 dose of MMR vaccine.  Pneumococcal 13-valent conjugate (PCV13) vaccine. When indicated, a person who is uncertain of his immunization history and has no record of immunization should receive the PCV13 vaccine. All adults 65 years of age and older should receive this  vaccine. An adult aged 19 years or older who has certain medical conditions and has not been previously immunized should receive 1 dose of PCV13 vaccine. This PCV13 should be followed with a dose of pneumococcal polysaccharide (PPSV23) vaccine. Adults who are at high risk for pneumococcal disease should obtain the PPSV23 vaccine at least 8 weeks after the dose of PCV13 vaccine. Adults older than 80 years of age who have normal immune system function should obtain the PPSV23 vaccine dose at least 1 year after the dose of PCV13 vaccine.  Pneumococcal polysaccharide (PPSV23) vaccine. When PCV13 is also indicated, PCV13 should be obtained first. All adults aged 65 years and older should be immunized. An adult younger than age 65 years who has certain medical conditions should be immunized. Any person who resides in a nursing home or long-term care facility should be immunized. An adult smoker should be immunized. People with an immunocompromised condition and certain other conditions should receive both PCV13 and PPSV23 vaccines. People with human immunodeficiency virus (HIV) infection should be immunized as soon as possible after diagnosis. Immunization during chemotherapy or radiation therapy should be avoided. Routine use of PPSV23 vaccine is not recommended for American Indians, [place] Natives, or people younger than 65 years unless there are medical conditions that require PPSV23 vaccine. When indicated, people who have unknown immunization and have no record of immunization should receive PPSV23 vaccine. One-time revaccination 5 years after the first dose of PPSV23 is recommended for people aged 19-64 years who have chronic kidney failure, nephrotic syndrome, asplenia, or immunocompromised conditions. People who received 1-2 doses of PPSV23 before age 65 years should receive another dose of PPSV23 vaccine at age 65 years or later if at least 5 years have passed since the previous dose. Doses of PPSV23 are not  needed for people immunized with PPSV23 at or after age 65 years.  Meningococcal vaccine. Adults with asplenia or persistent complement component deficiencies should receive 2 doses of quadrivalent meningococcal conjugate (MenACWY-D) vaccine. The doses should be obtained   at least 2 months apart. Microbiologists working with certain meningococcal bacteria, [place] recruits, people at risk during an outbreak, and people who travel to or live in countries with a high rate of meningitis should be immunized. A first-year college student up through age 34 years who is living in a residence hall should receive a dose if she did not receive a dose on or after her 16th birthday. Adults who have certain high-risk conditions should receive one or more doses of vaccine.  Hepatitis A vaccine. Adults who wish to be protected from this disease, have certain high-risk conditions, work with hepatitis A-infected animals, work in hepatitis A research labs, or travel to or work in countries with a high rate of hepatitis A should be immunized. Adults who were previously unvaccinated and who anticipate close contact with an international adoptee during the first 60 days after arrival in the [place] from a country with a high rate of hepatitis A should be immunized.  Hepatitis B vaccine. Adults who wish to be protected from this disease, have certain high-risk conditions, may be exposed to blood or other infectious body fluids, are household contacts or sex partners of hepatitis B positive people, are clients or workers in certain care facilities, or travel to or work in countries with a high rate of hepatitis B should be immunized.  Haemophilus influenzae type b (Hib) vaccine. A previously unvaccinated person with asplenia or sickle cell disease or having a scheduled splenectomy should receive 1 dose of Hib vaccine. Regardless of previous immunization, a recipient of a hematopoietic stem cell transplant should receive a  3-dose series 6-12 months after her successful transplant. Hib vaccine is not recommended for adults with HIV infection. Preventive Services / Frequency Ages 35 to 4 years  Blood pressure check.** / Every 3-5 years.  Lipid and cholesterol check.** / Every 5 years beginning at age 60.  Clinical breast exam.** / Every 3 years for women in their 71s and 10s.  BRCA-related cancer risk assessment.** / For women who have family members with a BRCA-related cancer (breast, ovarian, tubal, or peritoneal cancers).  Pap test.** / Every 2 years from ages 76 through 26. Every 3 years starting at age 61 through age 76 or 93 with a history of 3 consecutive normal Pap tests.  HPV screening.** / Every 3 years from ages 37 through ages 60 to 51 with a history of 3 consecutive normal Pap tests.  Hepatitis C blood test.** / For any individual with known risks for hepatitis C.  Skin self-exam. / Monthly.  Influenza vaccine. / Every year.  Tetanus, diphtheria, and acellular pertussis (Tdap, Td) vaccine.** / Consult your health care provider. Pregnant women should receive 1 dose of Tdap vaccine during each pregnancy. 1 dose of Td every 10 years.  Varicella vaccine.** / Consult your health care provider. Pregnant females who do not have evidence of immunity should receive the first dose after pregnancy.  HPV vaccine. / 3 doses over 6 months, if 93 and younger. The vaccine is not recommended for use in pregnant females. However, pregnancy testing is not needed before receiving a dose.  Measles, mumps, rubella (MMR) vaccine.** / You need at least 1 dose of MMR if you were born in 1957 or later. You may also need a 2nd dose. For females of childbearing age, rubella immunity should be determined. If there is no evidence of immunity, females who are not pregnant should be vaccinated. If there is no evidence of immunity, females who are  pregnant should delay immunization until after pregnancy.  Pneumococcal  13-valent conjugate (PCV13) vaccine.** / Consult your health care provider.  Pneumococcal polysaccharide (PPSV23) vaccine.** / 1 to 2 doses if you smoke cigarettes or if you have certain conditions.  Meningococcal vaccine.** / 1 dose if you are age 68 to 8 years and a Market researcher living in a residence hall, or have one of several medical conditions, you need to get vaccinated against meningococcal disease. You may also need additional booster doses.  Hepatitis A vaccine.** / Consult your health care provider.  Hepatitis B vaccine.** / Consult your health care provider.  Haemophilus influenzae type b (Hib) vaccine.** / Consult your health care provider. Ages 7 to 53 years  Blood pressure check.** / Every year.  Lipid and cholesterol check.** / Every 5 years beginning at age 25 years.  Lung cancer screening. / Every year if you are aged 11-80 years and have a 30-pack-year history of smoking and currently smoke or have quit within the past 15 years. Yearly screening is stopped once you have quit smoking for at least 15 years or develop a health problem that would prevent you from having lung cancer treatment.  Clinical breast exam.** / Every year after age 48 years.  BRCA-related cancer risk assessment.** / For women who have family members with a BRCA-related cancer (breast, ovarian, tubal, or peritoneal cancers).  Mammogram.** / Every year beginning at age 41 years and continuing for as long as you are in good health. Consult with your health care provider.  Pap test.** / Every 3 years starting at age 65 years through age 37 or 70 years with a history of 3 consecutive normal Pap tests.  HPV screening.** / Every 3 years from ages 72 years through ages 60 to 40 years with a history of 3 consecutive normal Pap tests.  Fecal occult blood test (FOBT) of stool. / Every year beginning at age 21 years and continuing until age 5 years. You may not need to do this test if you get  a colonoscopy every 10 years.  Flexible sigmoidoscopy or colonoscopy.** / Every 5 years for a flexible sigmoidoscopy or every 10 years for a colonoscopy beginning at age 35 years and continuing until age 48 years.  Hepatitis C blood test.** / For all people born from [birth year] through 1965 and any individual with known risks for hepatitis C.  Skin self-exam. / Monthly.  Influenza vaccine. / Every year.  Tetanus, diphtheria, and acellular pertussis (Tdap/Td) vaccine.** / Consult your health care provider. Pregnant women should receive 1 dose of Tdap vaccine during each pregnancy. 1 dose of Td every 10 years.  Varicella vaccine.** / Consult your health care provider. Pregnant females who do not have evidence of immunity should receive the first dose after pregnancy.  Zoster vaccine.** / 1 dose for adults aged 30 years or older.  Measles, mumps, rubella (MMR) vaccine.** / You need at least 1 dose of MMR if you were born in 1957 or later. You may also need a second dose. For females of childbearing age, rubella immunity should be determined. If there is no evidence of immunity, females who are not pregnant should be vaccinated. If there is no evidence of immunity, females who are pregnant should delay immunization until after pregnancy.  Pneumococcal 13-valent conjugate (PCV13) vaccine.** / Consult your health care provider.  Pneumococcal polysaccharide (PPSV23) vaccine.** / 1 to 2 doses if you smoke cigarettes or if you have certain conditions.  Meningococcal vaccine.** /  Consult your health care provider.  Hepatitis A vaccine.** / Consult your health care provider.  Hepatitis B vaccine.** / Consult your health care provider.  Haemophilus influenzae type b (Hib) vaccine.** / Consult your health care provider. Ages 64 years and over  Blood pressure check.** / Every year.  Lipid and cholesterol check.** / Every 5 years beginning at age 23 years.  Lung cancer screening. / Every year if you  are aged 16-80 years and have a 30-pack-year history of smoking and currently smoke or have quit within the past 15 years. Yearly screening is stopped once you have quit smoking for at least 15 years or develop a health problem that would prevent you from having lung cancer treatment.  Clinical breast exam.** / Every year after age 74 years.  BRCA-related cancer risk assessment.** / For women who have family members with a BRCA-related cancer (breast, ovarian, tubal, or peritoneal cancers).  Mammogram.** / Every year beginning at age 44 years and continuing for as long as you are in good health. Consult with your health care provider.  Pap test.** / Every 3 years starting at age 58 years through age 22 or 39 years with 3 consecutive normal Pap tests. Testing can be stopped between 65 and 70 years with 3 consecutive normal Pap tests and no abnormal Pap or HPV tests in the past 10 years.  HPV screening.** / Every 3 years from ages 64 years through ages 70 or 61 years with a history of 3 consecutive normal Pap tests. Testing can be stopped between 65 and 70 years with 3 consecutive normal Pap tests and no abnormal Pap or HPV tests in the past 10 years.  Fecal occult blood test (FOBT) of stool. / Every year beginning at age 40 years and continuing until age 27 years. You may not need to do this test if you get a colonoscopy every 10 years.  Flexible sigmoidoscopy or colonoscopy.** / Every 5 years for a flexible sigmoidoscopy or every 10 years for a colonoscopy beginning at age 7 years and continuing until age 32 years.  Hepatitis C blood test.** / For all people born from [birth year] through 1963 and any individual with known risks for hepatitis C.  Osteoporosis screening.** / A one-time screening for women ages 30 years and over and women at risk for fractures or osteoporosis.  Skin self-exam. / Monthly.  Influenza vaccine. / Every year.  Tetanus, diphtheria, and acellular pertussis (Tdap/Td)  vaccine.** / 1 dose of Td every 10 years.  Varicella vaccine.** / Consult your health care provider.  Zoster vaccine.** / 1 dose for adults aged 35 years or older.  Pneumococcal 13-valent conjugate (PCV13) vaccine.** / Consult your health care provider.  Pneumococcal polysaccharide (PPSV23) vaccine.** / 1 dose for all adults aged 46 years and older.  Meningococcal vaccine.** / Consult your health care provider.  Hepatitis A vaccine.** / Consult your health care provider.  Hepatitis B vaccine.** / Consult your health care provider.  Haemophilus influenzae type b (Hib) vaccine.** / Consult your health care provider. ** Family history and personal history of risk and conditions may change your health care provider's recommendations.   This information is not intended to replace advice given to you by your health care provider. Make sure you discuss any questions you have with your health care provider.   Document Released: 12/28/2001 Document Revised: 11/22/2014 Document Reviewed: 03/29/2011 Elsevier Interactive Patient Education [employer].

## 2015-11-19 NOTE — Assessment & Plan Note (Signed)
Chronic  Amitiza prn

## 2015-11-19 NOTE — Assessment & Plan Note (Signed)

## 2015-11-19 NOTE — Progress Notes (Signed)
Subjective:  Patient ID: Toni Mcmillan, female    DOB: December 17, 1930  Age: 82 y.o. MRN: [id number]  CC: No chief complaint on file.   HPI [name] presents for a well exam  Outpatient Prescriptions Prior to Visit  Medication Sig Dispense Refill  . aspirin EC 81 MG tablet Take 81 mg by mouth at bedtime.     Marland Kitchen aspirin-acetaminophen-caffeine (EXCEDRIN MIGRAINE) 250-250-65 MG per tablet Take 1-2 tablets by mouth every 6 (six) hours as needed for headache.    . Calcium Carbonate-Vit D-Min (CALCIUM 1200 PO) Take 1 tablet by mouth every morning.     . Cholecalciferol 1000 UNITS tablet Take 1,000 Units by mouth every morning.     . gabapentin (NEURONTIN) 100 MG capsule Take 1 capsule (100 mg total) by mouth 2 (two) times daily as needed (neuropathy). 60 capsule 5  . metoprolol (LOPRESSOR) 50 MG tablet TAKE ONE AND ONE-HALF TABLETS BY MOUTH TWICE DAILY 270 tablet 1  . Multiple Vitamins-Minerals (MULTIVITAMIN,TX-MINERALS) tablet Take 1 tablet by mouth every morning.     . Omega-3 Fatty Acids (FISH OIL PO) Take 1 tablet by mouth every morning.    . Polyvinyl Alcohol-Povidone (REFRESH OP) Apply 1-2 drops to eye daily as needed (dry eyes).    . ramipril (ALTACE) 2.5 MG capsule TAKE ONE CAPSULE BY MOUTH DAILY 90 capsule 3  . simvastatin (ZOCOR) 40 MG tablet TAKE 1 TABLET (40 MG TOTAL) BY MOUTH AT BEDTIME. 90 tablet 3  . Wheat Dextrin (BENEFIBER PO) Take 10 mLs by mouth daily.    Marland Kitchen zolpidem (AMBIEN) 10 MG tablet TAKE 1 TABLET BY MOUTH AT BEDTIME AS NEEDED FOR SLEEP 30 tablet 3  . GAVILYTE-N WITH FLAVOR PACK 420 G solution   0  . metoprolol (LOPRESSOR) 50 MG tablet TAKE ONE AND ONE-HALF TABLETS BY MOUTH TWICE DAILY 270 tablet 0  . metroNIDAZOLE (FLAGYL) 500 MG tablet Take 1 tablet (500 mg total) by mouth 3 (three) times daily. 30 tablet 0  . Probiotic Product (ALIGN) 4 MG CAPS Take 1 capsule by mouth daily. 30 capsule 11  . vitamin C (ASCORBIC ACID) 500 MG tablet Take 500 mg by mouth every  morning.      No facility-administered medications prior to visit.    ROS Review of Systems  Constitutional: Negative for chills, activity change, appetite change, fatigue and unexpected weight change.  HENT: Negative for congestion, mouth sores and sinus pressure.   Eyes: Negative for visual disturbance.  Respiratory: Negative for cough and chest tightness.   Gastrointestinal: Negative for nausea and abdominal pain.  Genitourinary: Negative for frequency, difficulty urinating and vaginal pain.  Musculoskeletal: Negative for back pain and gait problem.  Skin: Negative for pallor and rash.  Neurological: Negative for dizziness, tremors, weakness, numbness and headaches.  Psychiatric/Behavioral: Negative for confusion and sleep disturbance.    Objective:  BP 148/100 mmHg  Pulse 59  Ht 5' 1.5" (1.562 m)  Wt 125 lb (56.7 kg)  BMI 23.24 kg/m2  SpO2 97%  BP Readings from Last 3 Encounters:  11/19/15 148/100  07/16/15 160/90  06/04/15 118/78    Wt Readings from Last 3 Encounters:  11/19/15 125 lb (56.7 kg)  07/16/15 121 lb (54.885 kg)  06/04/15 121 lb (54.885 kg)    Physical Exam  Constitutional: She appears well-developed. No distress.  HENT:  Head: Normocephalic.  Right Ear: External ear normal.  Left Ear: External ear normal.  Nose: Nose normal.  Mouth/Throat: Oropharynx is clear and moist.  Eyes: Conjunctivae are normal. Pupils are equal, round, and reactive to light. Right eye exhibits no discharge. Left eye exhibits no discharge.  Neck: Normal range of motion. Neck supple. No JVD present. No tracheal deviation present. No thyromegaly present.  Cardiovascular: Normal rate, regular rhythm and normal heart sounds.   Pulmonary/Chest: No stridor. No respiratory distress. She has no wheezes.  Abdominal: Soft. Bowel sounds are normal. She exhibits no distension and no mass. There is no tenderness. There is no rebound and no guarding.  Musculoskeletal: She exhibits no edema  or tenderness.  Lymphadenopathy:    She has no cervical adenopathy.  Neurological: She displays normal reflexes. No cranial nerve deficit. She exhibits normal muscle tone. Coordination normal.  Skin: No rash noted. No erythema.  Psychiatric: She has a normal mood and affect. Her behavior is normal. Judgment and thought content normal.    Lab Results  Component Value Date   WBC 8.0 04/23/2015   HGB 12.5 04/23/2015   HCT 38.0 04/23/2015   PLT 338.0 04/23/2015   GLUCOSE 112* 06/04/2015   CHOL 110 07/12/2014   TRIG 99.0 07/12/2014   HDL 50.90 07/12/2014   LDLCALC 39 07/12/2014   ALT 13 06/04/2015   AST 23 06/04/2015   NA 141 06/04/2015   K 4.3 06/04/2015   CL 104 06/04/2015   CREATININE 0.66 06/04/2015   BUN 11 06/04/2015   CO2 30 06/04/2015   TSH 1.08 06/04/2015   HGBA1C 6.2 06/04/2015    No results found.  Assessment & Plan:   Diagnoses and all orders for this visit:  Well adult exam -     Basic metabolic panel; Future -     CBC with Differential/Platelet; Future -     Hemoglobin A1c; Future -     Hepatic function panel; Future -     Lipid panel; Future -     TSH; Future -     Urinalysis; Future  Type 2 diabetes, controlled, with neuropathy (HCC) -     Hemoglobin A1c; Future  INSOMNIA -     TSH; Future  Dyslipidemia -     CBC with Differential/Platelet; Future -     Hepatic function panel; Future -     Lipid panel; Future -     TSH; Future  Slow transit constipation -     TSH; Future  Other orders -     lubiprostone (AMITIZA) 24 MCG capsule; Take 1 capsule (24 mcg total) by mouth 2 (two) times daily with a meal.  I have discontinued Ms. Benison's vitamin C, metroNIDAZOLE, ALIGN, and GAVILYTE-N WITH FLAVOR PACK. I am also having her start on lubiprostone. Additionally, I am having her maintain her (multivitamin,tx-minerals), Calcium Carbonate-Vit D-Min (CALCIUM 1200 PO), aspirin EC, Cholecalciferol, Wheat Dextrin (BENEFIBER PO), Omega-3 Fatty Acids (FISH  OIL PO), aspirin-acetaminophen-caffeine, Polyvinyl Alcohol-Povidone (REFRESH OP), gabapentin, simvastatin, metoprolol, ramipril, and zolpidem.  Meds ordered this encounter  Medications  . lubiprostone (AMITIZA) 24 MCG capsule    Sig: Take 1 capsule (24 mcg total) by mouth 2 (two) times daily with a meal.    Dispense:  60 capsule    Refill:  11     Follow-up: No Follow-up on file.  Sonda PrimesAlex Plotnikov, MD

## 2015-11-19 NOTE — Assessment & Plan Note (Signed)
Diet controlled no medications. 

## 2015-12-10 ENCOUNTER — Encounter: Payer: Medicare Other | Admitting: Internal Medicine

## 2015-12-18 DIAGNOSIS — E113299 Type 2 diabetes mellitus with mild nonproliferative diabetic retinopathy without macular edema, unspecified eye: Secondary | ICD-10-CM | POA: Diagnosis not present

## 2015-12-18 DIAGNOSIS — Z961 Presence of intraocular lens: Secondary | ICD-10-CM | POA: Diagnosis not present

## 2015-12-18 DIAGNOSIS — H43823 Vitreomacular adhesion, bilateral: Secondary | ICD-10-CM | POA: Diagnosis not present

## 2015-12-18 DIAGNOSIS — H3581 Retinal edema: Secondary | ICD-10-CM | POA: Diagnosis not present

## 2015-12-18 DIAGNOSIS — E11311 Type 2 diabetes mellitus with unspecified diabetic retinopathy with macular edema: Secondary | ICD-10-CM | POA: Diagnosis not present

## 2015-12-18 DIAGNOSIS — H1013 Acute atopic conjunctivitis, bilateral: Secondary | ICD-10-CM | POA: Diagnosis not present

## 2015-12-18 DIAGNOSIS — E113291 Type 2 diabetes mellitus with mild nonproliferative diabetic retinopathy without macular edema, right eye: Secondary | ICD-10-CM | POA: Diagnosis not present

## 2015-12-28 ENCOUNTER — Other Ambulatory Visit: Payer: Self-pay | Admitting: Internal Medicine

## 2016-02-26 DIAGNOSIS — H524 Presbyopia: Secondary | ICD-10-CM | POA: Diagnosis not present

## 2016-03-25 DIAGNOSIS — H1013 Acute atopic conjunctivitis, bilateral: Secondary | ICD-10-CM | POA: Diagnosis not present

## 2016-03-25 DIAGNOSIS — H43823 Vitreomacular adhesion, bilateral: Secondary | ICD-10-CM | POA: Diagnosis not present

## 2016-03-25 DIAGNOSIS — Z961 Presence of intraocular lens: Secondary | ICD-10-CM | POA: Diagnosis not present

## 2016-03-25 DIAGNOSIS — E113213 Type 2 diabetes mellitus with mild nonproliferative diabetic retinopathy with macular edema, bilateral: Secondary | ICD-10-CM | POA: Diagnosis not present

## 2016-03-30 ENCOUNTER — Encounter: Payer: Self-pay | Admitting: Cardiovascular Disease

## 2016-03-30 ENCOUNTER — Ambulatory Visit (INDEPENDENT_AMBULATORY_CARE_PROVIDER_SITE_OTHER): Payer: Medicare Other | Admitting: Cardiovascular Disease

## 2016-03-30 VITALS — BP 122/70 | HR 58 | Ht 61.5 in | Wt 127.4 lb

## 2016-03-30 DIAGNOSIS — R002 Palpitations: Secondary | ICD-10-CM | POA: Diagnosis not present

## 2016-03-30 DIAGNOSIS — E785 Hyperlipidemia, unspecified: Secondary | ICD-10-CM

## 2016-03-30 DIAGNOSIS — I10 Essential (primary) hypertension: Secondary | ICD-10-CM | POA: Diagnosis not present

## 2016-03-30 DIAGNOSIS — R42 Dizziness and giddiness: Secondary | ICD-10-CM | POA: Diagnosis not present

## 2016-03-30 NOTE — Patient Instructions (Signed)
Medication Instructions:  Your physician recommends that you continue on your current medications as directed. Please refer to the Current Medication list given to you today.   Labwork: NONE  Testing/Procedures: Your physician has requested that you have an echocardiogram. Echocardiography is a painless test that uses sound waves to create images of your heart. It provides your doctor with information about the size and shape of your heart and how well your heart's chambers and valves are working. This procedure takes approximately one hour. There are no restrictions for this procedure.  Your physician has recommended that you wear an event monitor. Event monitors are medical devices that record the heart's electrical activity. Doctors most often us these monitors to diagnose arrhythmias. Arrhythmias are problems with the speed or rhythm of the heartbeat. The monitor is a small, portable device. You can wear one while you do your normal daily activities. This is usually used to diagnose what is causing palpitations/syncope (passing out).  30 DAY.    Follow-Up: Your physician recommends that you schedule a follow-up appointment IN ABOUT 6-7 WEEKS (AFTER 30-DAY EVENT MONITOR IS DONE.)   If you need a refill on your cardiac medications before your next appointment, please call your pharmacy.

## 2016-03-30 NOTE — Assessment & Plan Note (Signed)
The patient complains of intermittent dizziness while standing associated with irregular heart rhythm when she checks her pulse She also has some mild dyspnea.She has had syncope in the past. I'm going to check a 2-D echo and a 30 day event monitor.

## 2016-03-30 NOTE — Assessment & Plan Note (Signed)
History of hypertension blood pressure measured at 120/70 She is on metoprolol. Continue current meds at current dosing

## 2016-03-30 NOTE — Progress Notes (Signed)
03/30/2016 Shirlee MoreGertrude L Darroch   80/15/1932  [id number]  Primary Physician Sonda PrimesAlex Plotnikov, MD Primary Cardiologist: Runell GessJonathan J. Braian Tijerina MD Roseanne RenoFACP,FACC,FAHA, FSCAI   HPI:  Mrs. Toni Mcmillan is a delightful 82 year old thin-appearing married Caucasian female mother of 3, grandmother 7 grandchildren who is accompanied by her husband Homero FellersFrank. I had seen her remotely. She returns again today for dilation of dizziness and dyspnea. She has a history of treated hypertension and hyperlipidemia. She has never had a heart attack or stroke. She does complain of some dyspnea on exertion. She's had some recent dizziness while standing in the kitchen with tachycardia palpitations when she feels her pulse.She denies chest pain.   Current Outpatient Prescriptions  Medication Sig Dispense Refill  . aspirin EC 81 MG tablet Take 81 mg by mouth at bedtime.     Marland Kitchen. aspirin-acetaminophen-caffeine (EXCEDRIN MIGRAINE) 250-250-65 MG per tablet Take 1-2 tablets by mouth every 6 (six) hours as needed for headache.    . Calcium Carbonate-Vit D-Min (CALCIUM 1200 PO) Take 1 tablet by mouth every morning.     . Cholecalciferol 1000 UNITS tablet Take 1,000 Units by mouth every morning.     . ezetimibe-simvastatin (VYTORIN) 10-80 MG tablet Take 1 tablet by mouth daily.    Marland Kitchen. gabapentin (NEURONTIN) 100 MG capsule Take 1 capsule (100 mg total) by mouth 2 (two) times daily as needed (neuropathy). 60 capsule 5  . metoprolol (LOPRESSOR) 50 MG tablet TAKE ONE AND ONE-HALF TABLETS BY MOUTH TWICE DAILY 270 tablet 2  . Multiple Vitamins-Minerals (MULTIVITAMIN,TX-MINERALS) tablet Take 1 tablet by mouth every morning.     . Omega-3 Fatty Acids (FISH OIL PO) Take 1 tablet by mouth every morning.    . Polyvinyl Alcohol-Povidone (REFRESH OP) Apply 1-2 drops to eye daily as needed (dry eyes).    . ramipril (ALTACE) 2.5 MG capsule TAKE ONE CAPSULE BY MOUTH DAILY 90 capsule 3  . simvastatin (ZOCOR) 40 MG tablet TAKE 1 TABLET (40 MG TOTAL) BY MOUTH AT  BEDTIME. 90 tablet 3  . vitamin C (ASCORBIC ACID) 500 MG tablet Take 500 mg by mouth 2 (two) times daily.    . Wheat Dextrin (BENEFIBER PO) Take 10 mLs by mouth daily.    Marland Kitchen. zolpidem (AMBIEN) 10 MG tablet TAKE 1 TABLET BY MOUTH AT BEDTIME AS NEEDED FOR SLEEP 30 tablet 3   No current facility-administered medications for this visit.    Allergies  Allergen Reactions  . Celecoxib Other (See Comments)  . Clindamycin Hcl Itching  . Erythromycin Other (See Comments)    GI upset  . Latex Other (See Comments)    unknown  . Meperidine Itching  . Meperidine Hcl Other (See Comments)    unknown  . Morphine Other (See Comments)    Itching  . Tetracycline Other (See Comments)    GI upset  . Triprolidine-Pseudoephedrine Other (See Comments)    unknown    Social History   Social History  . Marital Status: Married    Spouse Name: N/A  . Number of Children: N/A  . Years of Education: N/A   Occupational History  . Not on file.   Social History Main Topics  . Smoking status: Never Smoker   . Smokeless tobacco: Never Used  . Alcohol Use: No  . Drug Use: No  . Sexual Activity: Not on file   Other Topics Concern  . 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  Cardiovascular: negative for chest pain, dyspnea on exertion, edema, orthopnea, palpitations, paroxysmal nocturnal dyspnea or shortness of breath Dermatological: negative for rash Respiratory: negative for cough or wheezing Urologic: negative for hematuria Abdominal: negative for nausea, vomiting, diarrhea, bright red blood per rectum, melena, or hematemesis Neurologic: negative for visual changes, syncope, or dizziness All other systems reviewed and are otherwise negative except  as noted above.    Blood pressure 122/70, pulse 58, height 5' 1.5" (1.562 m), weight 127 lb 6 oz (57.777 kg).  General appearance: alert and no distress Neck: no adenopathy, no carotid bruit, no JVD, supple, symmetrical, trachea midline and thyroid not enlarged, symmetric, no tenderness/mass/nodules Lungs: clear to auscultation bilaterally Heart: regular rate and rhythm, S1, S2 normal, no murmur, click, rub or gallop Extremities: extremities normal, atraumatic, no cyanosis or edema  EKG sinus bradycardia at 53 without ST or T-wave changes. I personally reviewed this EKG  ASSESSMENT AND PLAN:   Dyslipidemia istory of dyslipidemia on statin therapy followed by her PCP. Her most recent lipid profile performed 11/19/15 revealed total cholesterol 57 LDL 83 and HDL of 50  Essential hypertension History of hypertension blood pressure measured at 120/70 She is on metoprolol. Continue current meds at current dosing  Dizziness The patient complains of intermittent dizziness while standing associated with irregular heart rhythm when she checks her pulse She also has some mild dyspnea.She has had syncope in the past. I'm going to check a 2-D echo and a 30 day event monitor.      Runell Gess MD FACP,FACC,FAHA, [facility] 03/30/2016 11:59 AM

## 2016-03-30 NOTE — Assessment & Plan Note (Signed)
istory of dyslipidemia on statin therapy followed by her PCP. Her most recent lipid profile performed 11/19/15 revealed total cholesterol 57 LDL 83 and HDL of 50

## 2016-04-01 ENCOUNTER — Ambulatory Visit (INDEPENDENT_AMBULATORY_CARE_PROVIDER_SITE_OTHER): Payer: Medicare Other

## 2016-04-01 ENCOUNTER — Other Ambulatory Visit: Payer: Self-pay | Admitting: Cardiovascular Disease

## 2016-04-01 DIAGNOSIS — R42 Dizziness and giddiness: Secondary | ICD-10-CM | POA: Diagnosis not present

## 2016-04-01 DIAGNOSIS — R55 Syncope and collapse: Secondary | ICD-10-CM

## 2016-04-01 DIAGNOSIS — R002 Palpitations: Secondary | ICD-10-CM

## 2016-04-14 ENCOUNTER — Other Ambulatory Visit: Payer: Self-pay

## 2016-04-14 ENCOUNTER — Other Ambulatory Visit: Payer: Self-pay | Admitting: Cardiovascular Disease

## 2016-04-14 ENCOUNTER — Ambulatory Visit (HOSPITAL_COMMUNITY): Payer: Medicare Other | Attending: Cardiovascular Disease

## 2016-04-14 DIAGNOSIS — I10 Essential (primary) hypertension: Secondary | ICD-10-CM | POA: Diagnosis not present

## 2016-04-14 DIAGNOSIS — R002 Palpitations: Secondary | ICD-10-CM | POA: Diagnosis not present

## 2016-04-14 DIAGNOSIS — I340 Nonrheumatic mitral (valve) insufficiency: Secondary | ICD-10-CM | POA: Diagnosis not present

## 2016-04-14 DIAGNOSIS — I119 Hypertensive heart disease without heart failure: Secondary | ICD-10-CM | POA: Diagnosis not present

## 2016-04-14 DIAGNOSIS — E119 Type 2 diabetes mellitus without complications: Secondary | ICD-10-CM | POA: Insufficient documentation

## 2016-04-14 DIAGNOSIS — R42 Dizziness and giddiness: Secondary | ICD-10-CM

## 2016-04-14 DIAGNOSIS — E785 Hyperlipidemia, unspecified: Secondary | ICD-10-CM | POA: Diagnosis not present

## 2016-05-07 ENCOUNTER — Encounter: Payer: Self-pay | Admitting: Cardiovascular Disease

## 2016-05-07 ENCOUNTER — Ambulatory Visit (INDEPENDENT_AMBULATORY_CARE_PROVIDER_SITE_OTHER): Payer: Medicare Other | Admitting: Cardiovascular Disease

## 2016-05-07 VITALS — BP 136/74 | HR 54 | Ht 61.5 in | Wt 127.2 lb

## 2016-05-07 DIAGNOSIS — R002 Palpitations: Secondary | ICD-10-CM | POA: Diagnosis not present

## 2016-05-07 DIAGNOSIS — R0609 Other forms of dyspnea: Secondary | ICD-10-CM

## 2016-05-07 DIAGNOSIS — R0600 Dyspnea, unspecified: Secondary | ICD-10-CM

## 2016-05-07 NOTE — Assessment & Plan Note (Signed)
The patient was seen for dyspnea on exertion. A 2-D echocardiogram performed 04/14/16 showed normal LV size and function with severe left atrial enlargement.Her dyspnea has since resolved.

## 2016-05-07 NOTE — Progress Notes (Signed)
Ms Toni Mcmillan returns for follow-up of her outpatient studies. A 2-D echo was entirely normal except for left atrial enlargement. She continues to wear her event monitor but has had no recurrent palpitations. Her dyspnea has resolved as well. Her event monitor showed only unifocal PVCs.

## 2016-05-07 NOTE — Assessment & Plan Note (Signed)
He patient is wearing an event monitor for palpitations. She has not had any recurrent palpitations since I saw her last. We will review the results of her event monitor.

## 2016-05-07 NOTE — Patient Instructions (Signed)
Medication Instructions:  Your physician recommends that you continue on your current medications as directed. Please refer to the Current Medication list given to you today.   Labwork: none  Testing/Procedures: none  Follow-Up: Your physician recommends that you schedule a follow-up ON AN AS NEEDED BASIS.   Any Other Special Instructions Will Be Listed Below (If Applicable).     If you need a refill on your cardiac medications before your next appointment, please call your pharmacy.

## 2016-05-21 ENCOUNTER — Ambulatory Visit: Payer: Medicare Other | Admitting: Internal Medicine

## 2016-05-24 ENCOUNTER — Encounter: Payer: Self-pay | Admitting: Internal Medicine

## 2016-05-24 ENCOUNTER — Other Ambulatory Visit (INDEPENDENT_AMBULATORY_CARE_PROVIDER_SITE_OTHER): Payer: Medicare Other

## 2016-05-24 ENCOUNTER — Ambulatory Visit (INDEPENDENT_AMBULATORY_CARE_PROVIDER_SITE_OTHER): Payer: Medicare Other | Admitting: Internal Medicine

## 2016-05-24 VITALS — BP 126/74 | HR 67 | Wt 129.0 lb

## 2016-05-24 DIAGNOSIS — I10 Essential (primary) hypertension: Secondary | ICD-10-CM

## 2016-05-24 LAB — URINALYSIS, ROUTINE W REFLEX MICROSCOPIC
Bilirubin Urine: NEGATIVE
Hgb urine dipstick: NEGATIVE
KETONES UR: NEGATIVE
NITRITE: NEGATIVE
PH: 7 (ref 5.0–8.0)
SPECIFIC GRAVITY, URINE: 1.015 (ref 1.000–1.030)
Total Protein, Urine: NEGATIVE
URINE GLUCOSE: NEGATIVE
Urobilinogen, UA: 0.2 (ref 0.0–1.0)

## 2016-05-24 LAB — BASIC METABOLIC PANEL
BUN: 17 mg/dL (ref 6–23)
CALCIUM: 9.7 mg/dL (ref 8.4–10.5)
CO2: 31 mEq/L (ref 19–32)
Chloride: 104 mEq/L (ref 96–112)
Creatinine, Ser: 0.72 mg/dL (ref 0.40–1.20)
GFR: 82.18 mL/min (ref >60.00)
GLUCOSE: 124 mg/dL — AB (ref 70–99)
POTASSIUM: 4.5 meq/L (ref 3.5–5.1)
SODIUM: 140 meq/L (ref 135–145)

## 2016-05-24 MED ORDER — GABAPENTIN 100 MG PO CAPS: 100.0000 mg | ORAL_CAPSULE | Freq: Two times a day (BID) | ORAL | Status: DC | PRN

## 2016-05-24 MED ORDER — ZOLPIDEM TARTRATE 10 MG PO TABS: ORAL_TABLET | ORAL | Status: DC

## 2016-05-24 NOTE — Progress Notes (Signed)
Subjective:  Patient ID: Toni Mcmillan, female    DOB: 09/23/1931  Age: 82 y.o. MRN: [id number]  CC: No chief complaint on file.   HPI Toni Mcmillan presents for dyslipidemia, irregular HR, HTN f/u  Outpatient Prescriptions Prior to Visit  Medication Sig Dispense Refill  . aspirin EC 81 MG tablet Take 81 mg by mouth at bedtime.     Marland Kitchen. aspirin-acetaminophen-caffeine (EXCEDRIN MIGRAINE) 250-250-65 MG per tablet Take 1-2 tablets by mouth every 6 (six) hours as needed for headache.    . Calcium Carbonate-Vit D-Min (CALCIUM 1200 PO) Take 1 tablet by mouth every morning.     . Cholecalciferol 1000 UNITS tablet Take 1,000 Units by mouth every morning.     . ezetimibe-simvastatin (VYTORIN) 10-80 MG tablet Take 1 tablet by mouth daily.    Marland Kitchen. gabapentin (NEURONTIN) 100 MG capsule Take 1 capsule (100 mg total) by mouth 2 (two) times daily as needed (neuropathy). 60 capsule 5  . metoprolol (LOPRESSOR) 50 MG tablet TAKE ONE AND ONE-HALF TABLETS BY MOUTH TWICE DAILY 270 tablet 2  . Multiple Vitamins-Minerals (MULTIVITAMIN,TX-MINERALS) tablet Take 1 tablet by mouth every morning.     . Omega-3 Fatty Acids (FISH OIL PO) Take 1 tablet by mouth every morning.    . Polyvinyl Alcohol-Povidone (REFRESH OP) Apply 1-2 drops to eye daily as needed (dry eyes).    . ramipril (ALTACE) 2.5 MG capsule TAKE ONE CAPSULE BY MOUTH DAILY 90 capsule 3  . simvastatin (ZOCOR) 40 MG tablet TAKE 1 TABLET (40 MG TOTAL) BY MOUTH AT BEDTIME. 90 tablet 3  . vitamin C (ASCORBIC ACID) 500 MG tablet Take 500 mg by mouth 2 (two) times daily.    . Wheat Dextrin (BENEFIBER PO) Take 10 mLs by mouth daily.    Marland Kitchen. zolpidem (AMBIEN) 10 MG tablet TAKE 1 TABLET BY MOUTH AT BEDTIME AS NEEDED FOR SLEEP 30 tablet 3   No facility-administered medications prior to visit.    ROS Review of Systems  Constitutional: Negative for chills, activity change, appetite change, fatigue and unexpected weight change.  HENT: Negative for congestion,  mouth sores and sinus pressure.   Eyes: Negative for visual disturbance.  Respiratory: Negative for cough and chest tightness.   Gastrointestinal: Negative for nausea and abdominal pain.  Genitourinary: Negative for frequency, difficulty urinating and vaginal pain.  Musculoskeletal: Negative for back pain and gait problem.  Skin: Negative for pallor and rash.  Neurological: Negative for dizziness, tremors, weakness, numbness and headaches.  Psychiatric/Behavioral: Negative for confusion and sleep disturbance.    Objective:  BP 126/74 mmHg  Pulse 67  Wt 129 lb (58.514 kg)  SpO2 95%  BP Readings from Last 3 Encounters:  05/24/16 126/74  05/07/16 136/74  03/30/16 122/70    Wt Readings from Last 3 Encounters:  05/24/16 129 lb (58.514 kg)  05/07/16 127 lb 3.2 oz (57.698 kg)  03/30/16 127 lb 6 oz (57.777 kg)    Physical Exam  Constitutional: She appears well-developed. No distress.  HENT:  Head: Normocephalic.  Right Ear: External ear normal.  Left Ear: External ear normal.  Nose: Nose normal.  Mouth/Throat: Oropharynx is clear and moist.  Eyes: Conjunctivae are normal. Pupils are equal, round, and reactive to light. Right eye exhibits no discharge. Left eye exhibits no discharge.  Neck: Normal range of motion. Neck supple. No JVD present. No tracheal deviation present. No thyromegaly present.  Cardiovascular: Normal rate, regular rhythm and normal heart sounds.   Pulmonary/Chest: No stridor. No respiratory  distress. She has no wheezes.  Abdominal: Soft. Bowel sounds are normal. She exhibits no distension and no mass. There is no tenderness. There is no rebound and no guarding.  Musculoskeletal: She exhibits no edema or tenderness.  Lymphadenopathy:    She has no cervical adenopathy.  Neurological: She displays normal reflexes. No cranial nerve deficit. She exhibits normal muscle tone. Coordination normal.  Skin: No rash noted. No erythema.  Psychiatric: She has a normal mood  and affect. Her behavior is normal. Judgment and thought content normal.    Lab Results  Component Value Date   WBC 8.6 11/19/2015   HGB 12.3 11/19/2015   HCT 37.7 11/19/2015   PLT 306.0 11/19/2015   GLUCOSE 110* 11/19/2015   CHOL 157 11/19/2015   TRIG 119.0 11/19/2015   HDL 50.20 11/19/2015   LDLCALC 83 11/19/2015   ALT 13 11/19/2015   AST 21 11/19/2015   NA 142 11/19/2015   K 4.5 11/19/2015   CL 106 11/19/2015   CREATININE 0.71 11/19/2015   BUN 16 11/19/2015   CO2 29 11/19/2015   TSH 0.83 11/19/2015   HGBA1C 6.3 11/19/2015    No results found.  Assessment & Plan:   There are no diagnoses linked to this encounter. I am having Ms. Hou maintain her (multivitamin,tx-minerals), Calcium Carbonate-Vit D-Min (CALCIUM 1200 PO), aspirin EC, Cholecalciferol, Wheat Dextrin (BENEFIBER PO), Omega-3 Fatty Acids (FISH OIL PO), aspirin-acetaminophen-caffeine, Polyvinyl Alcohol-Povidone (REFRESH OP), gabapentin, simvastatin, ramipril, zolpidem, metoprolol, ezetimibe-simvastatin, and vitamin C.  No orders of the defined types were placed in this encounter.     Follow-up: No Follow-up on file.  Sonda Primes, MD

## 2016-05-24 NOTE — Progress Notes (Signed)
Pre visit review using our clinic review tool, if applicable. No additional management support is needed unless otherwise documented below in the visit note. 

## 2016-07-18 ENCOUNTER — Other Ambulatory Visit: Payer: Self-pay | Admitting: Internal Medicine

## 2016-08-05 ENCOUNTER — Ambulatory Visit (INDEPENDENT_AMBULATORY_CARE_PROVIDER_SITE_OTHER): Payer: Medicare Other

## 2016-08-05 DIAGNOSIS — Z23 Encounter for immunization: Secondary | ICD-10-CM

## 2016-08-09 ENCOUNTER — Other Ambulatory Visit: Payer: Self-pay | Admitting: Internal Medicine

## 2016-08-12 ENCOUNTER — Ambulatory Visit (INDEPENDENT_AMBULATORY_CARE_PROVIDER_SITE_OTHER): Payer: Medicare Other | Admitting: Nurse Practitioner

## 2016-08-12 ENCOUNTER — Encounter: Payer: Self-pay | Admitting: *Deleted

## 2016-08-12 ENCOUNTER — Encounter: Payer: Self-pay | Admitting: Nurse Practitioner

## 2016-08-12 ENCOUNTER — Other Ambulatory Visit (INDEPENDENT_AMBULATORY_CARE_PROVIDER_SITE_OTHER): Payer: Medicare Other

## 2016-08-12 VITALS — BP 140/86 | HR 69 | Temp 97.2°F | Ht 61.5 in | Wt 115.0 lb

## 2016-08-12 DIAGNOSIS — H8111 Benign paroxysmal vertigo, right ear: Secondary | ICD-10-CM

## 2016-08-12 DIAGNOSIS — H6123 Impacted cerumen, bilateral: Secondary | ICD-10-CM

## 2016-08-12 LAB — BASIC METABOLIC PANEL
BUN: 16 mg/dL (ref 6–23)
CALCIUM: 9.4 mg/dL (ref 8.4–10.5)
CO2: 31 meq/L (ref 19–32)
CREATININE: 0.71 mg/dL (ref 0.40–1.20)
Chloride: 104 mEq/L (ref 96–112)
GFR: 83.48 mL/min (ref >60.00)
GLUCOSE: 107 mg/dL — AB (ref 70–99)
Potassium: 4.5 mEq/L (ref 3.5–5.1)
Sodium: 140 mEq/L (ref 135–145)

## 2016-08-12 MED ORDER — MECLIZINE HCL 12.5 MG PO TABS: 12.5000 mg | ORAL_TABLET | Freq: Two times a day (BID) | ORAL | 0 refills | Status: DC | PRN

## 2016-08-12 NOTE — Progress Notes (Signed)
Normal results, see office note

## 2016-08-12 NOTE — Progress Notes (Signed)
Subjective:  Patient ID: Toni Mcmillan, female    DOB: Jan 10, 1931  Age: 82 y.o. MRN: [id number]  CC: Dizziness (Pt stated feeling dizzy and having hard time breathing when waking up in the morning.)  Patient in room with husband who is driving. Dizziness  This is a new problem. The current episode started 1 to 4 weeks ago. The problem occurs intermittently. The problem has been waxing and waning. Associated symptoms include fatigue, nausea and vertigo. Pertinent negatives include no abdominal pain, anorexia, chest pain, chills, congestion, coughing, diaphoresis, fever, headaches, myalgias, numbness, sore throat, swollen glands, urinary symptoms, visual change or weakness. The symptoms are aggravated by bending (laying down overnight, turning to right side). She has tried nothing for the symptoms.    Outpatient Medications Prior to Visit  Medication Sig Dispense Refill  . aspirin EC 81 MG tablet Take 81 mg by mouth at bedtime.     . Calcium Carbonate-Vit D-Min (CALCIUM 1200 PO) Take 1 tablet by mouth every morning.     . Cholecalciferol 1000 UNITS tablet Take 1,000 Units by mouth every morning.     . gabapentin (NEURONTIN) 100 MG capsule Take 1 capsule (100 mg total) by mouth 2 (two) times daily as needed (neuropathy). 60 capsule 5  . metoprolol (LOPRESSOR) 50 MG tablet TAKE ONE AND ONE-HALF TABLETS BY MOUTH TWICE DAILY 270 tablet 2  . Multiple Vitamins-Minerals (MULTIVITAMIN,TX-MINERALS) tablet Take 1 tablet by mouth every morning.     . Omega-3 Fatty Acids (FISH OIL PO) Take 1 tablet by mouth every morning.    . Polyvinyl Alcohol-Povidone (REFRESH OP) Apply 1-2 drops to eye daily as needed (dry eyes).    . ramipril (ALTACE) 2.5 MG capsule TAKE ONE CAPSULE BY MOUTH DAILY 90 capsule 3  . simvastatin (ZOCOR) 40 MG tablet TAKE 1 TABLET (40 MG TOTAL) BY MOUTH AT BEDTIME. 90 tablet 2  . vitamin C (ASCORBIC ACID) 500 MG tablet Take 500 mg by mouth 2 (two) times daily.    . Wheat Dextrin  (BENEFIBER PO) Take 10 mLs by mouth daily.    Marland Kitchen zolpidem (AMBIEN) 10 MG tablet TAKE 1 TABLET BY MOUTH AT BEDTIME AS NEEDED FOR SLEEP 30 tablet 3  . aspirin-acetaminophen-caffeine (EXCEDRIN MIGRAINE) 250-250-65 MG per tablet Take 1-2 tablets by mouth every 6 (six) hours as needed for headache.     No facility-administered medications prior to visit.     ROS See HPI  Objective:  BP 140/86 (BP Location: Left Arm)   Pulse 69   Temp 97.2 F (36.2 C)   Ht 5' 1.5" (1.562 m)   Wt 115 lb (52.2 kg)   SpO2 97%   BMI 21.38 kg/m   BP Readings from Last 3 Encounters:  08/12/16 140/86  05/24/16 126/74  05/07/16 136/74    Wt Readings from Last 3 Encounters:  08/12/16 115 lb (52.2 kg)  05/24/16 129 lb (58.5 kg)  05/07/16 127 lb 3.2 oz (57.7 kg)    Physical Exam  Constitutional: She is oriented to person, place, and time.  HENT:  Right Ear: External ear normal.  Left Ear: External ear normal.  Nose: Nose normal.  Mouth/Throat: Oropharynx is clear and moist. No oropharyngeal exudate.  Unable to visualize TMs due to cerumen impaction  Eyes: Conjunctivae and EOM are normal. Pupils are equal, round, and reactive to light. No scleral icterus.  Neck: Normal range of motion. Neck supple.  Cardiovascular: Normal rate, regular rhythm and normal heart sounds.   Pulmonary/Chest: Effort normal  and breath sounds normal.  Musculoskeletal: Normal range of motion. She exhibits no edema.  Neurological: She is alert and oriented to person, place, and time.  Skin: Skin is warm and dry.  Psychiatric: She has a normal mood and affect. Her behavior is normal.  Vitals reviewed.   Lab Results  Component Value Date   WBC 8.6 11/19/2015   HGB 12.3 11/19/2015   HCT 37.7 11/19/2015   PLT 306.0 11/19/2015   GLUCOSE 124 (H) 05/24/2016   CHOL 157 11/19/2015   TRIG 119.0 11/19/2015   HDL 50.20 11/19/2015   LDLCALC 83 11/19/2015   ALT 13 11/19/2015   AST 21 11/19/2015   NA 140 05/24/2016   K 4.5  05/24/2016   CL 104 05/24/2016   CREATININE 0.72 05/24/2016   BUN 17 05/24/2016   CO2 31 05/24/2016   TSH 0.83 11/19/2015   HGBA1C 6.3 11/19/2015    Assessment & Plan:   Jobe IgoGertrude was seen today for dizziness.  Diagnoses and all orders for this visit:  BPPV (benign paroxysmal positional vertigo), right -     meclizine (ANTIVERT) 12.5 MG tablet; Take 1 tablet (12.5 mg total) by mouth 2 (two) times daily as needed for dizziness (do not use for Mcmillan than 3days). -     Orthostatic vital signs -     Basic Metabolic Panel (BMET); Future  Cerumen impaction, bilateral   I am having Ms. Shillingburg start on meclizine. I am also having her maintain her (multivitamin,tx-minerals), Calcium Carbonate-Vit D-Min (CALCIUM 1200 PO), aspirin EC, Cholecalciferol, Wheat Dextrin (BENEFIBER PO), Omega-3 Fatty Acids (FISH OIL PO), aspirin-acetaminophen-caffeine, Polyvinyl Alcohol-Povidone (REFRESH OP), metoprolol, vitamin C, gabapentin, zolpidem, simvastatin, and ramipril.  Meds ordered this encounter  Medications  . meclizine (ANTIVERT) 12.5 MG tablet    Sig: Take 1 tablet (12.5 mg total) by mouth 2 (two) times daily as needed for dizziness (do not use for Mcmillan than 3days).    Dispense:  6 tablet    Refill:  0    Order Specific Question:   Supervising Provider    Answer:   Tresa GarterPLOTNIKOV, ALEKSEI V [[id number]]    Follow-up: Return if symptoms worsen or fail to improve.  Alysia Pennaharlotte Miki Blank, NP

## 2016-08-12 NOTE — Patient Instructions (Addendum)
Patient declined ear irrigation in office today, stating she already has appointment with ENT. Follow up with ENT to irrigate ears  Do vertigo exercise once a day. Change positions slowly. Encourage adequate oral hydration

## 2016-08-12 NOTE — Progress Notes (Signed)
Pre visit review using our clinic review tool, if applicable. No additional management support is needed unless otherwise documented below in the visit note. 

## 2016-08-16 DIAGNOSIS — H6123 Impacted cerumen, bilateral: Secondary | ICD-10-CM | POA: Insufficient documentation

## 2016-08-16 DIAGNOSIS — IMO0002 Reserved for concepts with insufficient information to code with codable children: Secondary | ICD-10-CM | POA: Insufficient documentation

## 2016-08-16 DIAGNOSIS — H8110 Benign paroxysmal vertigo, unspecified ear: Secondary | ICD-10-CM | POA: Diagnosis not present

## 2016-09-14 DIAGNOSIS — Z6823 Body mass index (BMI) 23.0-23.9, adult: Secondary | ICD-10-CM | POA: Diagnosis not present

## 2016-09-14 DIAGNOSIS — Z1231 Encounter for screening mammogram for malignant neoplasm of breast: Secondary | ICD-10-CM | POA: Diagnosis not present

## 2016-09-14 DIAGNOSIS — Z01419 Encounter for gynecological examination (general) (routine) without abnormal findings: Secondary | ICD-10-CM | POA: Diagnosis not present

## 2016-09-23 DIAGNOSIS — Z961 Presence of intraocular lens: Secondary | ICD-10-CM | POA: Diagnosis not present

## 2016-09-23 DIAGNOSIS — H43823 Vitreomacular adhesion, bilateral: Secondary | ICD-10-CM | POA: Diagnosis not present

## 2016-09-23 DIAGNOSIS — E113213 Type 2 diabetes mellitus with mild nonproliferative diabetic retinopathy with macular edema, bilateral: Secondary | ICD-10-CM | POA: Diagnosis not present

## 2016-10-29 ENCOUNTER — Telehealth: Payer: Self-pay | Admitting: Internal Medicine

## 2016-10-29 NOTE — Telephone Encounter (Signed)
Patient states she is having pain in her hands and would like to know if she should increase her gabapentin.

## 2016-10-30 NOTE — Telephone Encounter (Signed)
pls sch OV w/any provider Thx 

## 2016-11-24 ENCOUNTER — Ambulatory Visit (INDEPENDENT_AMBULATORY_CARE_PROVIDER_SITE_OTHER): Payer: Medicare Other | Admitting: Internal Medicine

## 2016-11-24 ENCOUNTER — Encounter: Payer: Self-pay | Admitting: Internal Medicine

## 2016-11-24 ENCOUNTER — Other Ambulatory Visit (INDEPENDENT_AMBULATORY_CARE_PROVIDER_SITE_OTHER): Payer: Medicare Other

## 2016-11-24 VITALS — BP 162/88 | HR 52 | Ht 62.0 in | Wt 127.0 lb

## 2016-11-24 DIAGNOSIS — E785 Hyperlipidemia, unspecified: Secondary | ICD-10-CM

## 2016-11-24 DIAGNOSIS — Z0000 Encounter for general adult medical examination without abnormal findings: Secondary | ICD-10-CM | POA: Diagnosis not present

## 2016-11-24 DIAGNOSIS — I10 Essential (primary) hypertension: Secondary | ICD-10-CM | POA: Diagnosis not present

## 2016-11-24 DIAGNOSIS — E1140 Type 2 diabetes mellitus with diabetic neuropathy, unspecified: Secondary | ICD-10-CM

## 2016-11-24 DIAGNOSIS — F5101 Primary insomnia: Secondary | ICD-10-CM | POA: Diagnosis not present

## 2016-11-24 LAB — LIPID PANEL
CHOL/HDL RATIO: 3
CHOLESTEROL: 150 mg/dL (ref 0–200)
HDL: 54.2 mg/dL (ref >39.00)
LDL Cholesterol: 74 mg/dL (ref 0–99)
NonHDL: 95.78
Triglycerides: 111 mg/dL (ref 0.0–149.0)
VLDL: 22.2 mg/dL (ref 0.0–40.0)

## 2016-11-24 LAB — CBC WITH DIFFERENTIAL/PLATELET
Basophils Absolute: 0 10*3/uL (ref 0.0–0.1)
Basophils Relative: 0.2 % (ref 0.0–3.0)
EOS PCT: 2.8 % (ref 0.0–5.0)
Eosinophils Absolute: 0.3 10*3/uL (ref 0.0–0.7)
HCT: 37.1 % (ref 36.0–46.0)
Hemoglobin: 12.6 g/dL (ref 12.0–15.0)
LYMPHS ABS: 2.6 10*3/uL (ref 0.7–4.0)
Lymphocytes Relative: 29 % (ref 12.0–46.0)
MCHC: 34 g/dL (ref 30.0–36.0)
MCV: 89.5 fl (ref 78.0–100.0)
MONO ABS: 0.5 10*3/uL (ref 0.1–1.0)
Monocytes Relative: 5.9 % (ref 3.0–12.0)
NEUTROS PCT: 62.1 % (ref 43.0–77.0)
Neutro Abs: 5.7 10*3/uL (ref 1.4–7.7)
Platelets: 313 10*3/uL (ref 150.0–400.0)
RBC: 4.15 Mil/uL (ref 3.87–5.11)
RDW: 12.9 % (ref 11.5–15.5)
WBC: 9.1 10*3/uL (ref 4.0–10.5)

## 2016-11-24 LAB — BASIC METABOLIC PANEL WITH GFR
BUN: 19 mg/dL (ref 6–23)
CO2: 27 meq/L (ref 19–32)
Calcium: 9.7 mg/dL (ref 8.4–10.5)
Chloride: 104 meq/L (ref 96–112)
Creatinine, Ser: 0.75 mg/dL (ref 0.40–1.20)
GFR: 78.31 mL/min (ref >60.00)
Glucose, Bld: 114 mg/dL — ABNORMAL HIGH (ref 70–99)
Potassium: 4.5 meq/L (ref 3.5–5.1)
Sodium: 140 meq/L (ref 135–145)

## 2016-11-24 LAB — URINALYSIS, ROUTINE W REFLEX MICROSCOPIC
Bilirubin Urine: NEGATIVE
Ketones, ur: NEGATIVE
Nitrite: NEGATIVE
PH: 6 (ref 5.0–8.0)
Specific Gravity, Urine: 1.02 (ref 1.000–1.030)
TOTAL PROTEIN, URINE-UPE24: NEGATIVE
Urine Glucose: NEGATIVE
Urobilinogen, UA: 0.2 (ref 0.0–1.0)

## 2016-11-24 LAB — HEPATIC FUNCTION PANEL
ALT: 11 U/L (ref 0–35)
AST: 19 U/L (ref 0–37)
Albumin: 4.4 g/dL (ref 3.5–5.2)
Alkaline Phosphatase: 52 U/L (ref 39–117)
BILIRUBIN DIRECT: 0.1 mg/dL (ref 0.0–0.3)
BILIRUBIN TOTAL: 0.5 mg/dL (ref 0.2–1.2)
Total Protein: 7.5 g/dL (ref 6.0–8.3)

## 2016-11-24 LAB — TSH: TSH: 1.06 u[IU]/mL (ref 0.35–4.50)

## 2016-11-24 LAB — HEMOGLOBIN A1C: Hgb A1c MFr Bld: 6.4 % (ref 4.6–6.5)

## 2016-11-24 MED ORDER — ZOLPIDEM TARTRATE 10 MG PO TABS: ORAL_TABLET | ORAL | 3 refills | Status: DC

## 2016-11-24 NOTE — Patient Instructions (Signed)

## 2016-11-24 NOTE — Assessment & Plan Note (Signed)

## 2016-11-24 NOTE — Assessment & Plan Note (Signed)
Zolpidem low dose 

## 2016-11-24 NOTE — Progress Notes (Signed)
Subjective:  Patient ID: Toni Mcmillan, female    DOB: 12/07/1930  Age: 83 y.o. MRN: [id number]  CC: No chief complaint on file.   HPI Toni Mcmillan presents for a well exam  Outpatient Medications Prior to Visit  Medication Sig Dispense Refill  . aspirin EC 81 MG tablet Take 81 mg by mouth at bedtime.     Marland Kitchen. aspirin-acetaminophen-caffeine (EXCEDRIN MIGRAINE) 250-250-65 MG per tablet Take 1-2 tablets by mouth every 6 (six) hours as needed for headache.    . Calcium Carbonate-Vit D-Min (CALCIUM 1200 PO) Take 1 tablet by mouth every morning.     . Cholecalciferol 1000 UNITS tablet Take 1,000 Units by mouth every morning.     . gabapentin (NEURONTIN) 100 MG capsule Take 1 capsule (100 mg total) by mouth 2 (two) times daily as needed (neuropathy). 60 capsule 5  . meclizine (ANTIVERT) 12.5 MG tablet Take 1 tablet (12.5 mg total) by mouth 2 (two) times daily as needed for dizziness (do not use for more than 3days). 6 tablet 0  . metoprolol (LOPRESSOR) 50 MG tablet TAKE ONE AND ONE-HALF TABLETS BY MOUTH TWICE DAILY 270 tablet 2  . Multiple Vitamins-Minerals (MULTIVITAMIN,TX-MINERALS) tablet Take 1 tablet by mouth every morning.     . Omega-3 Fatty Acids (FISH OIL PO) Take 1 tablet by mouth every morning.    . Polyvinyl Alcohol-Povidone (REFRESH OP) Apply 1-2 drops to eye daily as needed (dry eyes).    . ramipril (ALTACE) 2.5 MG capsule TAKE ONE CAPSULE BY MOUTH DAILY 90 capsule 3  . simvastatin (ZOCOR) 40 MG tablet TAKE 1 TABLET (40 MG TOTAL) BY MOUTH AT BEDTIME. 90 tablet 2  . vitamin C (ASCORBIC ACID) 500 MG tablet Take 500 mg by mouth 2 (two) times daily.    . Wheat Dextrin (BENEFIBER PO) Take 10 mLs by mouth daily.    Marland Kitchen. zolpidem (AMBIEN) 10 MG tablet TAKE 1 TABLET BY MOUTH AT BEDTIME AS NEEDED FOR SLEEP 30 tablet 3   No facility-administered medications prior to visit.     ROS Review of Systems  Constitutional: Negative for activity change, appetite change, chills, fatigue and  unexpected weight change.  HENT: Negative for congestion, mouth sores and sinus pressure.   Eyes: Negative for visual disturbance.  Respiratory: Negative for cough and chest tightness.   Gastrointestinal: Negative for abdominal pain and nausea.  Genitourinary: Negative for difficulty urinating, frequency and vaginal pain.  Musculoskeletal: Negative for back pain and gait problem.  Skin: Negative for pallor and rash.  Neurological: Negative for dizziness, tremors, weakness, numbness and headaches.  Psychiatric/Behavioral: Negative for confusion and sleep disturbance.    Objective:  BP (!) 162/88   Pulse (!) 52   Ht 5\' 2"  (1.575 m)   Wt 127 lb (57.6 kg)   SpO2 99%   BMI 23.23 kg/m   BP Readings from Last 3 Encounters:  11/24/16 (!) 162/88  08/12/16 140/86  05/24/16 126/74    Wt Readings from Last 3 Encounters:  11/24/16 127 lb (57.6 kg)  08/12/16 115 lb (52.2 kg)  05/24/16 129 lb (58.5 kg)    Physical Exam  Constitutional: She appears well-developed. No distress.  HENT:  Head: Normocephalic.  Right Ear: External ear normal.  Left Ear: External ear normal.  Nose: Nose normal.  Mouth/Throat: Oropharynx is clear and moist.  Eyes: Conjunctivae are normal. Pupils are equal, round, and reactive to light. Right eye exhibits no discharge. Left eye exhibits no discharge.  Neck: Normal range  of motion. Neck supple. No JVD present. No tracheal deviation present. No thyromegaly present.  Cardiovascular: Normal rate, regular rhythm and normal heart sounds.   Pulmonary/Chest: No stridor. No respiratory distress. She has no wheezes.  Abdominal: Soft. Bowel sounds are normal. She exhibits no distension and no mass. There is no tenderness. There is no rebound and no guarding.  Musculoskeletal: She exhibits no edema or tenderness.  Lymphadenopathy:    She has no cervical adenopathy.  Neurological: She displays normal reflexes. No cranial nerve deficit. She exhibits normal muscle tone.  Coordination normal.  Skin: No rash noted. No erythema.  Psychiatric: She has a normal mood and affect. Her behavior is normal. Judgment and thought content normal.    Lab Results  Component Value Date   WBC 8.6 11/19/2015   HGB 12.3 11/19/2015   HCT 37.7 11/19/2015   PLT 306.0 11/19/2015   GLUCOSE 107 (H) 08/12/2016   CHOL 157 11/19/2015   TRIG 119.0 11/19/2015   HDL 50.20 11/19/2015   LDLCALC 83 11/19/2015   ALT 13 11/19/2015   AST 21 11/19/2015   NA 140 08/12/2016   K 4.5 08/12/2016   CL 104 08/12/2016   CREATININE 0.71 08/12/2016   BUN 16 08/12/2016   CO2 31 08/12/2016   TSH 0.83 11/19/2015   HGBA1C 6.3 11/19/2015    No results found.  Assessment & Plan:   There are no diagnoses linked to this encounter. I am having Ms. Glennie maintain her (multivitamin,tx-minerals), Calcium Carbonate-Vit D-Min (CALCIUM 1200 PO), aspirin EC, Cholecalciferol, Wheat Dextrin (BENEFIBER PO), Omega-3 Fatty Acids (FISH OIL PO), aspirin-acetaminophen-caffeine, Polyvinyl Alcohol-Povidone (REFRESH OP), metoprolol, vitamin C, gabapentin, zolpidem, simvastatin, ramipril, and meclizine.  No orders of the defined types were placed in this encounter.    Follow-up: No Follow-up on file.  Sonda Primes, MD

## 2016-11-24 NOTE — Assessment & Plan Note (Signed)
Simvastatin Labs 

## 2016-11-24 NOTE — Progress Notes (Signed)
Pre visit review using our clinic review tool, if applicable. No additional management support is needed unless otherwise documented below in the visit note. 

## 2016-11-24 NOTE — Assessment & Plan Note (Signed)
Labs

## 2016-11-24 NOTE — Assessment & Plan Note (Signed)
Toprol, Ramipril

## 2016-11-27 ENCOUNTER — Other Ambulatory Visit (HOSPITAL_COMMUNITY)
Admission: RE | Admit: 2016-11-27 | Discharge: 2016-11-27 | Disposition: A | Discharge status: Home / Self Care | Payer: Medicare Other | Source: Other Acute Inpatient Hospital | Attending: Internal Medicine | Admitting: Internal Medicine

## 2016-11-27 ENCOUNTER — Encounter: Payer: Self-pay | Admitting: Family Medicine

## 2016-11-27 ENCOUNTER — Ambulatory Visit (INDEPENDENT_AMBULATORY_CARE_PROVIDER_SITE_OTHER): Payer: Medicare Other | Admitting: Family Medicine

## 2016-11-27 VITALS — BP 110/74 | HR 78 | Temp 98.1°F | Resp 14 | Ht 62.0 in | Wt 128.0 lb

## 2016-11-27 DIAGNOSIS — R300 Dysuria: Secondary | ICD-10-CM | POA: Diagnosis not present

## 2016-11-27 DIAGNOSIS — N3000 Acute cystitis without hematuria: Secondary | ICD-10-CM | POA: Insufficient documentation

## 2016-11-27 DIAGNOSIS — N3090 Cystitis, unspecified without hematuria: Secondary | ICD-10-CM | POA: Diagnosis not present

## 2016-11-27 LAB — POC URINALSYSI DIPSTICK (AUTOMATED)
Bilirubin, UA: NEGATIVE
GLUCOSE UA: NEGATIVE
Ketones, UA: NEGATIVE
NITRITE UA: NEGATIVE
PH UA: 6
Protein, UA: 15
Spec Grav, UA: >=1.03
UROBILINOGEN UA: 0.2

## 2016-11-27 MED ORDER — CIPROFLOXACIN HCL 250 MG PO TABS: 250.0000 mg | ORAL_TABLET | Freq: Two times a day (BID) | ORAL | 0 refills | Status: DC

## 2016-11-27 NOTE — Patient Instructions (Addendum)
Drink lots of water Take cipro as directed with food  Update if not starting to improve in several days or if worsening   We will send your urine for a culture and let you know then that returns      Urinary Tract Infection, Adult A urinary tract infection (UTI) is an infection of any part of the urinary tract, which includes the kidneys, ureters, bladder, and urethra. These organs make, store, and get rid of urine in the body. UTI can be a bladder infection (cystitis) or kidney infection (pyelonephritis). What are the causes? This infection may be caused by fungi, viruses, or bacteria. Bacteria are the most common cause of UTIs. This condition can also be caused by repeated incomplete emptying of the bladder during urination. What increases the risk? This condition is more likely to develop if:  You ignore your need to urinate or hold urine for long periods of time.  You do not empty your bladder completely during urination.  You wipe back to front after urinating or having a bowel movement, if you are female.  You are uncircumcised, if you are female.  You are constipated.  You have a urinary catheter that stays in place (indwelling).  You have a weak defense (immune) system.  You have a medical condition that affects your bowels, kidneys, or bladder.  You have diabetes.  You take antibiotic medicines frequently or for long periods of time, and the antibiotics no longer work well against certain types of infections (antibiotic resistance).  You take medicines that irritate your urinary tract.  You are exposed to chemicals that irritate your urinary tract.  You are female. What are the signs or symptoms? Symptoms of this condition include:  Fever.  Frequent urination or passing small amounts of urine frequently.  Needing to urinate urgently.  Pain or burning with urination.  Urine that smells bad or unusual.  Cloudy urine.  Pain in the lower abdomen or  back.  Trouble urinating.  Blood in the urine.  Vomiting or being less hungry than normal.  Diarrhea or abdominal pain.  Vaginal discharge, if you are female. How is this diagnosed? This condition is diagnosed with a medical history and physical exam. You will also need to provide a urine sample to test your urine. Other tests may be done, including:  Blood tests.  Sexually transmitted disease (STD) testing. If you have had more than one UTI, a cystoscopy or imaging studies may be done to determine the cause of the infections. How is this treated? Treatment for this condition often includes a combination of two or more of the following:  Antibiotic medicine.  Other medicines to treat less common causes of UTI.  Over-the-counter medicines to treat pain.  Drinking enough water to stay hydrated. Follow these instructions at home:  Take over-the-counter and prescription medicines only as told by your health care provider.  If you were prescribed an antibiotic, take it as told by your health care provider. Do not stop taking the antibiotic even if you start to feel better.  Avoid alcohol, caffeine, tea, and carbonated beverages. They can irritate your bladder.  Drink enough fluid to keep your urine clear or pale yellow.  Keep all follow-up visits as told by your health care provider. This is important.  Make sure to:  Empty your bladder often and completely. Do not hold urine for long periods of time.  Empty your bladder before and after sex.  Wipe from front to back after a  bowel movement if you are female. Use each tissue one time when you wipe. Contact a health care provider if:  You have back pain.  You have a fever.  You feel nauseous or vomit.  Your symptoms do not get better after 3 days.  Your symptoms go away and then return. Get help right away if:  You have severe back pain or lower abdominal pain.  You are vomiting and cannot keep down any  medicines or water. This information is not intended to replace advice given to you by your health care provider. Make sure you discuss any questions you have with your health care provider. Document Released: 08/11/2005 Document Revised: 04/14/2016 Document Reviewed: 09/22/2015 [employer] Interactive Patient Education  2017 [employer]

## 2016-11-27 NOTE — Progress Notes (Signed)
Subjective:    Patient ID: Toni Mcmillan, female    DOB: 03-12-1931, 83 y.o.   MRN: [id number]  HPI Here with urinary symptoms   Symptoms started in the middle of the night  Had to get up to urinate frequently  In am - worse - with dysuria  Frequency- as soon as she gets up and also urgency No blood in her urine   No flank pain  No n/v  No fever   Temp: 98.1 F (36.7 C)   Did wake up with a mild headache this am   Does not get utis often   BP Readings from Last 3 Encounters:  11/27/16 110/74  11/24/16 (!) 162/88  08/12/16 140/86   She drinks a fair amt of water- 8 glasses daily   ua today is pos for leukocytes and blood cells with sg of 1.030 Results for orders placed or performed in visit on 11/27/16  POCT Urinalysis Dipstick (Automated)  Result Value Ref Range   Color, UA yellow    Clarity, UA cloudy    Glucose, UA neg    Bilirubin, UA neg    Ketones, UA neg    Spec Grav, UA >=1.030    Blood, UA 2+    pH, UA 6.0    Protein, UA 15 mg/dl    Urobilinogen, UA 0.2    Nitrite, UA neg    Leukocytes, UA moderate (2+) (A) Negative       Patient Active Problem List   Diagnosis Date Noted  . Acute cystitis 11/27/2016  . Dyspnea on exertion 05/07/2016  . Palpitations 05/07/2016  . Dizziness 03/30/2016  . Vitreomacular traction syndrome of both eyes 03/25/2016  . Constipation 11/19/2015  . 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 Paresthesia of both feet 06/04/2015  . Abdominal bloating 04/23/2015  . Stool incontinence 04/23/2015  . Blood in stool 04/23/2015  . Well adult exam 07/11/2014  . Syncope and collapse 05/09/2014  . Dysautonomia orthostatic hypotension syndrome (HCC) 12/15/2013  . Allergic conjunctivitis 03/22/2013  . Vitreomacular adhesion of both eyes 03/22/2013  . Status post intraocular lens implant 06/22/2012  . Diabetic macular edema of left eye (HCC) 09/23/2011  . Nonproliferative  diabetic retinopathy (HCC) 09/23/2011  . Routine general medical examination at a health care facility 05/11/2011  . Type 2 diabetes, controlled, with neuropathy (HCC) 12/16/2009  . Dyslipidemia 10/30/2007  . Essential hypertension 10/30/2007  . SINUSITIS, CHRONIC 10/30/2007  . ALLERGIC RHINITIS 10/30/2007  . Insomnia 10/30/2007  . ANOSMIA 10/30/2007  . DYSPEPSIA, HX OF 10/30/2007  . CATARACT EXTRACTIONS, BILATERAL, HX OF 10/30/2007   Past Medical History:  Diagnosis Date  . Allergic rhinitis, cause unspecified   . Diabetes mellitus without complication (HCC)    borderline"never on meds"  . Disturbances of sensation of smell and taste   . Dizziness   . Headache(784.0)    has decreased to none in later years  . Insomnia, unspecified   . Other and unspecified hyperlipidemia   . Personal history of other diseases of digestive system   . Scarlet fever   . Unspecified essential hypertension    Metoprolol decreased due to drop in BP readings.  Marland Kitchen Unspecified sinusitis (chronic)   . Whooping cough, unspecified organism    Past Surgical History:  Procedure Laterality Date  . ABDOMINAL HYSTERECTOMY    . ANAL RECTAL MANOMETRY N/A 07/07/2015   Procedure: ANO RECTAL MANOMETRY;  Surgeon: Charolett Bumpers, MD;  Location: [facility];  Service: Endoscopy;  Laterality: N/A;  . BILATERAL OOPHORECTOMY    . CATARACT EXTRACTION, BILATERAL    . COLONOSCOPY W/ POLYPECTOMY    . COLONOSCOPY WITH PROPOFOL N/A 05/29/2015   Procedure: COLONOSCOPY WITH PROPOFOL;  Surgeon: Charolett BumpersMartin K Johnson, MD;  Location: [facility];  Service: Endoscopy;  Laterality: N/A;  . correction of hammertoe deformity    . CYSTOSCOPY     w/ stenting of both ureters  . HEMORRHOID SURGERY    . hydrosalpinx diagnostic laparoscopy    . TONSILLECTOMY  age 83  . vericose vein surgery     Social History  Substance Use Topics  . Smoking status: Never Smoker  . Smokeless tobacco: Never Used  . Alcohol use No   Family History    Problem Relation Age of Onset  . Cancer Mother     ?  . Diabetes Mother   . Other Mother     coagulopathy/ ruptured viscus  . Hypertension Father   . Hyperlipidemia Father     ?  Marland Kitchen. Other Father     CVA  . Cancer Paternal Grandmother     breast   Allergies  Allergen Reactions  . Celecoxib Other (See Comments)  . Clindamycin Hcl Itching  . Erythromycin Other (See Comments)    GI upset  . Latex Other (See Comments)    unknown  . Meperidine Itching  . Meperidine Hcl Other (See Comments)    unknown  . Morphine Other (See Comments)    Itching  . Tetracycline Other (See Comments)    GI upset  . Triprolidine-Pseudoephedrine Other (See Comments)    unknown   Current Outpatient Prescriptions on File Prior to Visit  Medication Sig Dispense Refill  . aspirin EC 81 MG tablet Take 81 mg by mouth at bedtime.     Marland Kitchen. aspirin-acetaminophen-caffeine (EXCEDRIN MIGRAINE) 250-250-65 MG per tablet Take 1-2 tablets by mouth every 6 (six) hours as needed for headache.    . Calcium Carbonate-Vit D-Min (CALCIUM 1200 PO) Take 1 tablet by mouth every morning.     . Cholecalciferol 1000 UNITS tablet Take 1,000 Units by mouth every morning.     . gabapentin (NEURONTIN) 100 MG capsule Take 1 capsule (100 mg total) by mouth 2 (two) times daily as needed (neuropathy). 60 capsule 5  . meclizine (ANTIVERT) 12.5 MG tablet Take 1 tablet (12.5 mg total) by mouth 2 (two) times daily as needed for dizziness (do not use for Mcmillan than 3days). 6 tablet 0  . metoprolol (LOPRESSOR) 50 MG tablet TAKE ONE AND ONE-HALF TABLETS BY MOUTH TWICE DAILY 270 tablet 2  . Multiple Vitamins-Minerals (MULTIVITAMIN,TX-MINERALS) tablet Take 1 tablet by mouth every morning.     . Omega-3 Fatty Acids (FISH OIL PO) Take 1 tablet by mouth every morning.    . Polyvinyl Alcohol-Povidone (REFRESH OP) Apply 1-2 drops to eye daily as needed (dry eyes).    . ramipril (ALTACE) 2.5 MG capsule TAKE ONE CAPSULE BY MOUTH DAILY 90 capsule 3  .  simvastatin (ZOCOR) 40 MG tablet TAKE 1 TABLET (40 MG TOTAL) BY MOUTH AT BEDTIME. 90 tablet 2  . vitamin C (ASCORBIC ACID) 500 MG tablet Take 500 mg by mouth 2 (two) times daily.    . Wheat Dextrin (BENEFIBER PO) Take 10 mLs by mouth daily.    Marland Kitchen. zolpidem (AMBIEN) 10 MG tablet TAKE 1 TABLET BY MOUTH AT BEDTIME AS NEEDED FOR SLEEP 30 tablet 3   No current facility-administered medications on  file prior to visit.     Review of Systems  Constitutional: Positive for fatigue. Negative for activity change, appetite change and fever.  HENT: Negative for congestion and sore throat.   Eyes: Negative for itching and visual disturbance.  Respiratory: Negative for cough and shortness of breath.   Cardiovascular: Negative for leg swelling.  Gastrointestinal: Negative for abdominal distention, abdominal pain, constipation, diarrhea and nausea.  Endocrine: Negative for cold intolerance and polydipsia.  Genitourinary: Positive for dysuria, frequency and urgency. Negative for difficulty urinating, flank pain and hematuria.  Musculoskeletal: Negative for myalgias.  Skin: Negative for rash.  Allergic/Immunologic: Negative for immunocompromised state.  Neurological: Negative for dizziness and weakness.  Hematological: Negative for adenopathy.       Objective:   Physical Exam  Constitutional: She appears well-developed and well-nourished. No distress.  HENT:  Head: Normocephalic and atraumatic.  Eyes: Conjunctivae and EOM are normal. Pupils are equal, round, and reactive to light.  Neck: Normal range of motion. Neck supple.  Cardiovascular: Normal rate, regular rhythm and normal heart sounds.   Pulmonary/Chest: Effort normal and breath sounds normal.  Abdominal: Soft. Bowel sounds are normal. She exhibits no distension. There is tenderness. There is no rebound.  No cva tenderness  Mild suprapubic tenderness  Musculoskeletal: She exhibits no edema.  Lymphadenopathy:    She has no cervical adenopathy.   Neurological: She is alert.  Skin: No rash noted.  Psychiatric: She has a normal mood and affect.          Assessment & Plan:   Problem List Items Addressed This Visit      Genitourinary   Acute cystitis    Uncomplicated  Cover with cipro for 5 d  Inc fluids  Handout given  Update if not starting to improve in a several days or if worsening        Relevant Orders   Urine culture    Other Visit Diagnoses    Dysuria    -  Primary   Relevant Orders   POCT Urinalysis Dipstick (Automated) (Completed)

## 2016-11-27 NOTE — Progress Notes (Signed)
Pre visit review using our clinic review tool, if applicable. No additional management support is needed unless otherwise documented below in the visit note. 

## 2016-11-27 NOTE — Assessment & Plan Note (Signed)
Uncomplicated  Cover with cipro for 5 d  Inc fluids  Handout given  Update if not starting to improve in a several days or if worsening

## 2016-11-29 LAB — URINE CULTURE

## 2016-12-04 ENCOUNTER — Other Ambulatory Visit: Payer: Self-pay | Admitting: Internal Medicine

## 2016-12-09 ENCOUNTER — Telehealth: Payer: Self-pay | Admitting: Internal Medicine

## 2016-12-09 NOTE — Telephone Encounter (Signed)
Patient Name: Nils PyleGERTRUDE Yinger DOB: 10/13/1931 Initial Comment Caller States she has a temp of 99.6, cough Nurse Assessment Nurse: Charna Elizabethrumbull, RN, Cathy Date/Time (Eastern Time): 12/09/2016 10:26:10 AM Confirm and document reason for call. If symptomatic, describe symptoms. ---Jobe IgoGertrude states she developed a cough and upper back pain (rated as a 1 on the 1 to 10 scale) last night and fever this morning (99.6 oral this morning). No severe breathing difficulty. No chest pain. Alert and responsive. Does the patient have any new or worsening symptoms? ---Yes Will a triage be completed? ---Yes Related visit to physician within the last 2 weeks? ---No Does the PT have any chronic conditions? (i.e. diabetes, asthma, etc.) ---Yes List chronic conditions. ---High Cholesterol, Irregular Heart beats Is this a behavioral health or substance abuse call? ---No Guidelines Guideline Title Affirmed Question Affirmed Notes Cough - Acute Non-Productive SEVERE coughing spells (e.g., whooping sound after coughing, vomiting after coughing) Final Disposition User See Physician within 24 Hours Oradellrumbull, RN, Lynden AngCathy Comments Scheduled 8:30am 12/10/16 appointment with Elease EtienneNche, NP. Referrals REFERRED TO PCP OFFICE Disagree/Comply: Comply

## 2016-12-10 ENCOUNTER — Ambulatory Visit (INDEPENDENT_AMBULATORY_CARE_PROVIDER_SITE_OTHER): Payer: Medicare Other | Admitting: Nurse Practitioner

## 2016-12-10 ENCOUNTER — Encounter: Payer: Self-pay | Admitting: Nurse Practitioner

## 2016-12-10 VITALS — BP 114/74 | HR 60 | Temp 98.6°F | Ht 62.0 in | Wt 126.8 lb

## 2016-12-10 DIAGNOSIS — J069 Acute upper respiratory infection, unspecified: Secondary | ICD-10-CM

## 2016-12-10 LAB — POCT INFLUENZA A/B
Influenza A, POC: NEGATIVE
Influenza B, POC: NEGATIVE

## 2016-12-10 MED ORDER — DM-GUAIFENESIN ER 30-600 MG PO TB12: 1.0000 | ORAL_TABLET | Freq: Two times a day (BID) | ORAL | 0 refills | Status: DC | PRN

## 2016-12-10 MED ORDER — IPRATROPIUM BROMIDE 0.03 % NA SOLN: 2.0000 | Freq: Two times a day (BID) | NASAL | 0 refills | Status: DC

## 2016-12-10 NOTE — Progress Notes (Signed)
Subjective:  Patient ID: Toni Mcmillan, female    DOB: 03/20/1931  Age: 83 y.o. MRN: [id number]  CC: Cough (Started on Wednesday. Pt states that it is a dry nonproductive cough. No wheezing or rattling in her chest. )   URI   This is a new problem. The current episode started yesterday. The problem has been gradually worsening. The maximum temperature recorded prior to her arrival was 101 - 101.9 F. The fever has been present for 1 to 2 days. Associated symptoms include congestion, coughing, headaches, joint pain, rhinorrhea, sinus pain, sneezing, a sore throat and swollen glands. Pertinent negatives include no abdominal pain, chest pain, diarrhea, dysuria, ear pain, joint swelling, nausea, neck pain, plugged ear sensation, rash, vomiting or wheezing. She has tried nothing for the symptoms.    Outpatient Medications Prior to Visit  Medication Sig Dispense Refill  . aspirin EC 81 MG tablet Take 81 mg by mouth at bedtime.     Marland Kitchen. aspirin-acetaminophen-caffeine (EXCEDRIN MIGRAINE) 250-250-65 MG per tablet Take 1-2 tablets by mouth every 6 (six) hours as needed for headache.    . Calcium Carbonate-Vit D-Min (CALCIUM 1200 PO) Take 1 tablet by mouth every morning.     . Cholecalciferol 1000 UNITS tablet Take 1,000 Units by mouth every morning.     . gabapentin (NEURONTIN) 100 MG capsule Take 1 capsule (100 mg total) by mouth 2 (two) times daily as needed (neuropathy). 60 capsule 5  . metoprolol (LOPRESSOR) 50 MG tablet TAKE ONE AND ONE-HALF TABLETS BY MOUTH TWICE DAILY 270 tablet 1  . Multiple Vitamins-Minerals (MULTIVITAMIN,TX-MINERALS) tablet Take 1 tablet by mouth every morning.     . Omega-3 Fatty Acids (FISH OIL PO) Take 1 tablet by mouth every morning.    . Polyvinyl Alcohol-Povidone (REFRESH OP) Apply 1-2 drops to eye daily as needed (dry eyes).    . ramipril (ALTACE) 2.5 MG capsule TAKE ONE CAPSULE BY MOUTH DAILY 90 capsule 3  . simvastatin (ZOCOR) 40 MG tablet TAKE 1 TABLET (40 MG TOTAL)  BY MOUTH AT BEDTIME. 90 tablet 2  . vitamin C (ASCORBIC ACID) 500 MG tablet Take 500 mg by mouth 2 (two) times daily.    . Wheat Dextrin (BENEFIBER PO) Take 10 mLs by mouth daily.    Marland Kitchen. zolpidem (AMBIEN) 10 MG tablet TAKE 1 TABLET BY MOUTH AT BEDTIME AS NEEDED FOR SLEEP 30 tablet 3  . ciprofloxacin (CIPRO) 250 MG tablet Take 1 tablet (250 mg total) by mouth 2 (two) times daily. With food 10 tablet 0  . meclizine (ANTIVERT) 12.5 MG tablet Take 1 tablet (12.5 mg total) by mouth 2 (two) times daily as needed for dizziness (do not use for more than 3days). 6 tablet 0   No facility-administered medications prior to visit.     ROS See HPI  Objective:  BP 114/74 (BP Location: Right Arm, Patient Position: Sitting, Cuff Size: Normal)   Pulse 60   Temp 98.6 F (37 C) (Oral)   Ht 5\' 2"  (1.575 m)   Wt 126 lb 12 oz (57.5 kg)   SpO2 90%   BMI 23.18 kg/m   BP Readings from Last 3 Encounters:  12/10/16 114/74  11/27/16 110/74  11/24/16 (!) 162/88    Wt Readings from Last 3 Encounters:  12/10/16 126 lb 12 oz (57.5 kg)  11/27/16 128 lb (58.1 kg)  11/24/16 127 lb (57.6 kg)    Physical Exam  Constitutional: She is oriented to person, place, and time.  HENT:  Right Ear: Tympanic membrane, external ear and ear canal normal.  Left Ear: Tympanic membrane, external ear and ear canal normal.  Nose: Mucosal edema and rhinorrhea present. Right sinus exhibits maxillary sinus tenderness and frontal sinus tenderness. Left sinus exhibits maxillary sinus tenderness and frontal sinus tenderness.  Mouth/Throat: Uvula is midline. No trismus in the jaw. Posterior oropharyngeal erythema present. No oropharyngeal exudate.  Eyes: No scleral icterus.  Neck: Normal range of motion. Neck supple.  Cardiovascular: Normal rate and normal heart sounds.   Pulmonary/Chest: Effort normal and breath sounds normal.  Musculoskeletal: She exhibits no edema.  Lymphadenopathy:    She has cervical adenopathy.  Neurological:  She is alert and oriented to person, place, and time.  Vitals reviewed.   Lab Results  Component Value Date   WBC 9.1 11/24/2016   HGB 12.6 11/24/2016   HCT 37.1 11/24/2016   PLT 313.0 11/24/2016   GLUCOSE 114 (H) 11/24/2016   CHOL 150 11/24/2016   TRIG 111.0 11/24/2016   HDL 54.20 11/24/2016   LDLCALC 74 11/24/2016   ALT 11 11/24/2016   AST 19 11/24/2016   NA 140 11/24/2016   K 4.5 11/24/2016   CL 104 11/24/2016   CREATININE 0.75 11/24/2016   BUN 19 11/24/2016   CO2 27 11/24/2016   TSH 1.06 11/24/2016   HGBA1C 6.4 11/24/2016    No results found.  Assessment & Plan:   Tasheena was seen today for cough.  Diagnoses and all orders for this visit:  Acute URI -     POCT Influenza A/B -     ipratropium (ATROVENT) 0.03 % nasal spray; Place 2 sprays into both nostrils 2 (two) times daily. Do not use for more than 5days. -     dextromethorphan-guaiFENesin (MUCINEX DM) 30-600 MG 12hr tablet; Take 1 tablet by mouth 2 (two) times daily as needed for cough.   I have discontinued Ms. Roeder's meclizine and ciprofloxacin. I am also having her start on ipratropium and dextromethorphan-guaiFENesin. Additionally, I am having her maintain her (multivitamin,tx-minerals), Calcium Carbonate-Vit D-Min (CALCIUM 1200 PO), aspirin EC, Cholecalciferol, Wheat Dextrin (BENEFIBER PO), Omega-3 Fatty Acids (FISH OIL PO), aspirin-acetaminophen-caffeine, Polyvinyl Alcohol-Povidone (REFRESH OP), vitamin C, gabapentin, simvastatin, ramipril, zolpidem, and metoprolol.  Meds ordered this encounter  Medications  . ipratropium (ATROVENT) 0.03 % nasal spray    Sig: Place 2 sprays into both nostrils 2 (two) times daily. Do not use for more than 5days.    Dispense:  30 mL    Refill:  0    Order Specific Question:   Supervising Provider    Answer:   Tresa Garter [[id number]]  . dextromethorphan-guaiFENesin (MUCINEX DM) 30-600 MG 12hr tablet    Sig: Take 1 tablet by mouth 2 (two) times daily as needed for  cough.    Dispense:  14 tablet    Refill:  0    Order Specific Question:   Supervising Provider    Answer:   Tresa Garter [[id number]]    Follow-up: Return if symptoms worsen or fail to improve.  Alysia Penna, NP

## 2016-12-10 NOTE — Patient Instructions (Signed)

## 2016-12-10 NOTE — Progress Notes (Signed)
Reviewed with patient in office. See office note

## 2016-12-10 NOTE — Progress Notes (Signed)
Pre visit review using our clinic review tool, if applicable. No additional management support is needed unless otherwise documented below in the visit note. 

## 2016-12-21 ENCOUNTER — Encounter: Payer: Self-pay | Admitting: Family Medicine

## 2016-12-21 ENCOUNTER — Ambulatory Visit (INDEPENDENT_AMBULATORY_CARE_PROVIDER_SITE_OTHER)
Admission: RE | Admit: 2016-12-21 | Discharge: 2016-12-21 | Disposition: A | Discharge status: Home / Self Care | Payer: Medicare Other | Source: Ambulatory Visit | Attending: Family Medicine | Admitting: Family Medicine

## 2016-12-21 ENCOUNTER — Ambulatory Visit (INDEPENDENT_AMBULATORY_CARE_PROVIDER_SITE_OTHER): Payer: Medicare Other | Admitting: Family Medicine

## 2016-12-21 VITALS — BP 110/78 | HR 65 | Temp 97.9°F | Resp 12 | Ht 62.0 in | Wt 120.5 lb

## 2016-12-21 DIAGNOSIS — R05 Cough: Secondary | ICD-10-CM

## 2016-12-21 DIAGNOSIS — R059 Cough, unspecified: Secondary | ICD-10-CM

## 2016-12-21 DIAGNOSIS — K219 Gastro-esophageal reflux disease without esophagitis: Secondary | ICD-10-CM

## 2016-12-21 DIAGNOSIS — K5900 Constipation, unspecified: Secondary | ICD-10-CM

## 2016-12-21 DIAGNOSIS — M549 Dorsalgia, unspecified: Secondary | ICD-10-CM

## 2016-12-21 DIAGNOSIS — J069 Acute upper respiratory infection, unspecified: Secondary | ICD-10-CM | POA: Diagnosis not present

## 2016-12-21 LAB — CBC WITH DIFFERENTIAL/PLATELET
BASOS PCT: 0.3 % (ref 0.0–3.0)
Basophils Absolute: 0 10*3/uL (ref 0.0–0.1)
EOS ABS: 0.1 10*3/uL (ref 0.0–0.7)
Eosinophils Relative: 1.7 % (ref 0.0–5.0)
HEMATOCRIT: 36 % (ref 36.0–46.0)
Hemoglobin: 12 g/dL (ref 12.0–15.0)
Lymphocytes Relative: 27 % (ref 12.0–46.0)
Lymphs Abs: 2.4 10*3/uL (ref 0.7–4.0)
MCHC: 33.3 g/dL (ref 30.0–36.0)
MCV: 89.7 fl (ref 78.0–100.0)
MONO ABS: 0.9 10*3/uL (ref 0.1–1.0)
Monocytes Relative: 10.2 % (ref 3.0–12.0)
NEUTROS ABS: 5.3 10*3/uL (ref 1.4–7.7)
Neutrophils Relative %: 60.8 % (ref 43.0–77.0)
PLATELETS: 378 10*3/uL (ref 150.0–400.0)
RBC: 4.01 Mil/uL (ref 3.87–5.11)
RDW: 12.5 % (ref 11.5–15.5)
WBC: 8.8 10*3/uL (ref 4.0–10.5)

## 2016-12-21 MED ORDER — OMEPRAZOLE 20 MG PO CPDR: 20.0000 mg | DELAYED_RELEASE_CAPSULE | Freq: Every day | ORAL | 0 refills | Status: DC

## 2016-12-21 MED ORDER — BENZONATATE 100 MG PO CAPS: 200.0000 mg | ORAL_CAPSULE | Freq: Two times a day (BID) | ORAL | 0 refills | Status: AC | PRN

## 2016-12-21 NOTE — Patient Instructions (Signed)
  [name] I have seen you today for an acute visit.  A few things to remember from today's visit:   Cough - Plan: CBC with Differential/Platelet, DG Chest 2 View  Upper respiratory tract infection, unspecified type - Plan: CBC with Differential/Platelet, DG Chest 2 View  Bilateral back pain, unspecified back location, unspecified chronicity - Plan: CBC with Differential/Platelet  Gastroesophageal reflux disease, esophagitis presence not specified Cough after respiratory infections can last weeks and even months.  Ramipril,GERD,allergies can also cause cough.  Try Omeprazole before breakfast.    Avoid foods that make your symptoms worse, for example coffee, chocolate,pepermeint,alcohol, and greasy food. Raising the head of your bed about 6 inches may help with nocturnal symptoms.  Avoid tobacco use. Weight loss (if you are overweight). Avoid lying down for 3 hours after eating.  Instead 3 large meals daily try small and more frequent meals during the day.  Every medication have side effects and medications for GERD are not the exception.At this time I think benefit is greater than risk.   You should be evaluated immediately if bloody vomiting, bloody stools, black stools (like tar), difficulty swallowing, food gets stuck on the way down or choking when eating. Abnormal weight loss or severe abdominal pain.   For back pain icy Hot with Lidocaine.     Medications prescribed today are intended for short period of time and will not be refill upon request, a follow up appointment might be necessary to discuss continuation of of treatment if appropriate.     In general please monitor for signs of worsening symptoms and seek immediate medical attention if any concerning.  If symptoms are not resolved in a few days/weeks you should schedule a follow up appointment with your doctor, before if needed.  Please be sure you have an appointment already scheduled with  your PCP before you leave today.

## 2016-12-21 NOTE — Progress Notes (Signed)
HPI:  ACUTE VISIT  Chief Complaint  Patient presents with  . still feeling bad    [name] is a 83 y.o.female here today with her husband complaining of persistent cough.  She was seen on 12/10/16 with generalized viral like symptoms, symptomatic treatment recommended. She reports resolution of most symptoms but cough is not better.  Non productive cough. Mild nasal congestion, rhinorrhea, and post nasal drainage. No longer having fever,chills, or body aches. She has not noted chest pain, dyspnea, or wheezing.  No Hx of recent travel.   Hx of allergies: Yes.  OTC medications for this problem: Mucinex DM  + GERD, "severe" heartburn, exacerbated by certain foods, alleviated by OTC antiacid medications, which she takes as needed.  -She is also having intermittent upper and lower back pain, mild, achy like, exacerbated by certain positions and by coughing. She states that she "cannot get comfortable" when she is laying in bed.  Pain is not radiated.  She denies limitation of her daily activities, has not noted rash. She denies Hx of back pain, numbness, or tingling. No recent falls or injuries. Symptoms otherwise stable.  She denies abdominal pain, nausea,or vomiting. Hx of constipation, has not had one in 4 days. She attributes it to decreased oral intake. Decreased appetite, cannot taste food. + Flatus.  Hx of DM II, she is not checking BS's.   Review of Systems  Constitutional: Positive for appetite change and fatigue. Negative for activity change, chills and fever.  HENT: Positive for congestion. Negative for mouth sores, postnasal drip, sinus pressure, sore throat, trouble swallowing and voice change.   Eyes: Negative for discharge and redness.  Respiratory: Positive for cough. Negative for shortness of breath and wheezing.   Cardiovascular: Negative for chest pain and leg swelling.  Gastrointestinal: Positive for constipation. Negative for  abdominal pain, nausea and vomiting.  Genitourinary: Negative for decreased urine volume, dysuria and hematuria.  Musculoskeletal: Positive for back pain. Negative for gait problem and neck pain.  Skin: Negative for rash.  Allergic/Immunologic: Positive for environmental allergies.  Neurological: Negative for syncope, weakness and headaches.  Hematological: Negative for adenopathy. Does not bruise/bleed easily.  Psychiatric/Behavioral: Negative for confusion.      Current Outpatient Prescriptions on File Prior to Visit  Medication Sig Dispense Refill  . aspirin EC 81 MG tablet Take 81 mg by mouth at bedtime.     Marland Kitchen aspirin-acetaminophen-caffeine (EXCEDRIN MIGRAINE) 250-250-65 MG per tablet Take 1-2 tablets by mouth every 6 (six) hours as needed for headache.    . Calcium Carbonate-Vit D-Min (CALCIUM 1200 PO) Take 1 tablet by mouth every morning.     . Cholecalciferol 1000 UNITS tablet Take 1,000 Units by mouth every morning.     Marland Kitchen dextromethorphan-guaiFENesin (MUCINEX DM) 30-600 MG 12hr tablet Take 1 tablet by mouth 2 (two) times daily as needed for cough. 14 tablet 0  . gabapentin (NEURONTIN) 100 MG capsule Take 1 capsule (100 mg total) by mouth 2 (two) times daily as needed (neuropathy). 60 capsule 5  . ipratropium (ATROVENT) 0.03 % nasal spray Place 2 sprays into both nostrils 2 (two) times daily. Do not use for more than 5days. 30 mL 0  . metoprolol (LOPRESSOR) 50 MG tablet TAKE ONE AND ONE-HALF TABLETS BY MOUTH TWICE DAILY 270 tablet 1  . Multiple Vitamins-Minerals (MULTIVITAMIN,TX-MINERALS) tablet Take 1 tablet by mouth every morning.     . Omega-3 Fatty Acids (FISH OIL PO) Take 1 tablet by mouth every morning.    Marland Kitchen  Polyvinyl Alcohol-Povidone (REFRESH OP) Apply 1-2 drops to eye daily as needed (dry eyes).    . ramipril (ALTACE) 2.5 MG capsule TAKE ONE CAPSULE BY MOUTH DAILY 90 capsule 3  . simvastatin (ZOCOR) 40 MG tablet TAKE 1 TABLET (40 MG TOTAL) BY MOUTH AT BEDTIME. 90 tablet 2  .  vitamin C (ASCORBIC ACID) 500 MG tablet Take 500 mg by mouth 2 (two) times daily.    . Wheat Dextrin (BENEFIBER PO) Take 10 mLs by mouth daily.    Marland Kitchen zolpidem (AMBIEN) 10 MG tablet TAKE 1 TABLET BY MOUTH AT BEDTIME AS NEEDED FOR SLEEP 30 tablet 3   No current facility-administered medications on file prior to visit.      Past Medical History:  Diagnosis Date  . Allergic rhinitis, cause unspecified   . Diabetes mellitus without complication (HCC)    borderline"never on meds"  . Disturbances of sensation of smell and taste   . Dizziness   . Headache(784.0)    has decreased to none in later years  . Insomnia, unspecified   . Other and unspecified hyperlipidemia   . Personal history of other diseases of digestive system   . Scarlet fever   . Unspecified essential hypertension    Metoprolol decreased due to drop in BP readings.  Marland Kitchen Unspecified sinusitis (chronic)   . Whooping cough, unspecified organism    Allergies  Allergen Reactions  . Celecoxib Other (See Comments)  . Clindamycin Hcl Itching  . Erythromycin Other (See Comments)    GI upset  . Latex Other (See Comments)    unknown  . Meperidine Itching  . Meperidine Hcl Other (See Comments)    unknown  . Morphine Other (See Comments)    Itching  . Tetracycline Other (See Comments)    GI upset  . Triprolidine-Pseudoephedrine Other (See Comments)    unknown    Social History   Social History  . Marital status: Married    Spouse name: N/A  . Number of children: N/A  . Years of education: N/A   Social History Main Topics  . Smoking status: Never Smoker  . Smokeless tobacco: Never Used  . Alcohol use No  . Drug use: No  . Sexual activity: Not Asked   Other Topics Concern  . 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   Physical Exam  Nursing note and vitals reviewed. Constitutional: She is oriented to person, place, and time. She appears well-developed and well-nourished. She does not appear ill. No distress.  HENT:  Head: Atraumatic.  Nose: Rhinorrhea and septal deviation present. Right sinus exhibits no maxillary sinus tenderness and no frontal sinus tenderness. Left sinus exhibits no maxillary sinus tenderness and no frontal sinus tenderness.  Mouth/Throat: Oropharynx is clear and moist and mucous membranes are normal.  Eyes: Conjunctivae are normal.  Cardiovascular: Normal rate and regular rhythm.   No murmur heard. Respiratory: Effort normal and breath sounds normal. No stridor. No respiratory distress. She has no wheezes. She has no rales.  GI: Soft. Bowel sounds are normal. She exhibits no mass. There is no tenderness.  Musculoskeletal: She exhibits no  edema.  Pain upon palpation right interscapular area and lumbar paraspinal muscles.  Lymphadenopathy:       Head (right side): No submandibular adenopathy present.       Head (left side): No submandibular adenopathy present.    She has no cervical adenopathy.  Neurological: She is alert and oriented to person, place, and time. She has normal strength.  Stable gait with no assistance.  Skin: Skin is warm. No rash noted. No erythema.  Psychiatric: She has a normal mood and affect. Her speech is normal.  Well groomed, good eye contact.      ASSESSMENT AND PLAN:   Jobe IgoGertrude was seen today for still feeling bad.  Diagnoses and all orders for this visit:  Lab Results  Component Value Date   WBC 8.8 12/21/2016   HGB 12.0 12/21/2016   HCT 36.0 12/21/2016   MCV 89.7 12/21/2016   PLT 378.0 12/21/2016    Cough  Explained cough can last a few weeks after URI has resolved. Other possible causes discussed: allergies, GERD, and medications  (she is on ACEI). Caution with Mucinex DM.  -     benzonatate (TESSALON) 100 MG capsule; Take 2 capsules (200 mg total) by mouth 2 (two) times daily as needed for cough. -     CBC with Differential/Platelet -     DG Chest 2 View; Future  Upper respiratory tract infection, unspecified type  Most symptoms resolved,having residual symptoms. Lung auscultation negative for rales or other abnormality.  Today CXR and CBC done, further recommendations will be given according to results.  -     CBC with Differential/Platelet -     DG Chest 2 View; Future  Bilateral back pain, unspecified back location, unspecified chronicity  Seems to be muscular. OTC topical medications as [facility] with lidocaine or local heat may help. Acetaminophen 500 mg tid if needed. Instructed about warning signs.  -     CBC with Differential/Platelet  Gastroesophageal reflux disease, esophagitis presence not specified  Symptomatic. This problem could be aggravating cough. Omeprazole 20 mg daily for 3-4 weeks. GERD precautions discussed.  -     omeprazole (PRILOSEC) 20 MG capsule; Take 1 capsule (20 mg total) by mouth daily.  Constipation, unspecified constipation type  Abdominal examination negative otherwise. Increase fiber and fluid intake.  Instructed about warning signs.     -[name] was advised to return or notify a doctor immediately if symptoms worsen or  new concerns arise, otherwise she will follow with her PCP in 2 weeks.       Betty G. SwazilandJordan, MD  [facility]. [place] office.

## 2016-12-21 NOTE — Progress Notes (Signed)
Pre visit review using our clinic review tool, if applicable. No additional management support is needed unless otherwise documented below in the visit note. 

## 2016-12-22 ENCOUNTER — Ambulatory Visit: Payer: Medicare Other | Admitting: Nurse Practitioner

## 2016-12-27 DIAGNOSIS — M25572 Pain in left ankle and joints of left foot: Secondary | ICD-10-CM | POA: Diagnosis not present

## 2016-12-27 DIAGNOSIS — G8929 Other chronic pain: Secondary | ICD-10-CM | POA: Diagnosis not present

## 2016-12-27 DIAGNOSIS — M25571 Pain in right ankle and joints of right foot: Secondary | ICD-10-CM | POA: Diagnosis not present

## 2017-01-10 ENCOUNTER — Other Ambulatory Visit (INDEPENDENT_AMBULATORY_CARE_PROVIDER_SITE_OTHER): Payer: Medicare Other

## 2017-01-10 ENCOUNTER — Encounter: Payer: Self-pay | Admitting: Internal Medicine

## 2017-01-10 ENCOUNTER — Ambulatory Visit (INDEPENDENT_AMBULATORY_CARE_PROVIDER_SITE_OTHER): Payer: Medicare Other | Admitting: Internal Medicine

## 2017-01-10 ENCOUNTER — Telehealth: Payer: Self-pay

## 2017-01-10 VITALS — BP 120/76 | HR 64 | Temp 98.4°F | Resp 16 | Ht 62.0 in | Wt 125.0 lb

## 2017-01-10 DIAGNOSIS — E1140 Type 2 diabetes mellitus with diabetic neuropathy, unspecified: Secondary | ICD-10-CM

## 2017-01-10 DIAGNOSIS — M79671 Pain in right foot: Secondary | ICD-10-CM

## 2017-01-10 DIAGNOSIS — M79672 Pain in left foot: Secondary | ICD-10-CM

## 2017-01-10 DIAGNOSIS — R202 Paresthesia of skin: Secondary | ICD-10-CM | POA: Diagnosis not present

## 2017-01-10 DIAGNOSIS — M79604 Pain in right leg: Secondary | ICD-10-CM

## 2017-01-10 DIAGNOSIS — M79605 Pain in left leg: Secondary | ICD-10-CM

## 2017-01-10 LAB — BASIC METABOLIC PANEL
BUN: 12 mg/dL (ref 6–23)
CALCIUM: 9.4 mg/dL (ref 8.4–10.5)
CO2: 28 meq/L (ref 19–32)
CREATININE: 0.66 mg/dL (ref 0.40–1.20)
Chloride: 105 mEq/L (ref 96–112)
GFR: 90.72 mL/min (ref >60.00)
Glucose, Bld: 100 mg/dL — ABNORMAL HIGH (ref 70–99)
Potassium: 4.3 mEq/L (ref 3.5–5.1)
Sodium: 141 mEq/L (ref 135–145)

## 2017-01-10 LAB — SEDIMENTATION RATE: SED RATE: 34 mm/h — AB (ref 0–30)

## 2017-01-10 LAB — URIC ACID: Uric Acid, Serum: 5.4 mg/dL (ref 2.4–7.0)

## 2017-01-10 NOTE — Assessment & Plan Note (Signed)
On Gabapentin 

## 2017-01-10 NOTE — Assessment & Plan Note (Signed)
  On diet  

## 2017-01-10 NOTE — Telephone Encounter (Signed)
Vein and vascular specialist called stated the US for Lower Extremity  Arterial Duplex needs an ABI ordered with it. Please send a new order for this and include the ABI. They wont be able to schedule until sometime late March 2018.... Please advise 

## 2017-01-10 NOTE — Progress Notes (Signed)
Pre-visit discussion using our clinic review tool. No additional management support is needed unless otherwise documented below in the visit note.  

## 2017-01-10 NOTE — Assessment & Plan Note (Signed)
R>>L ankle R/o gout And PVD Labs, Duplex [place]

## 2017-01-10 NOTE — Progress Notes (Signed)
Subjective:  Patient ID: Toni Mcmillan, female    DOB: 11/27/1930  Age: 83 y.o. MRN: [id number]  CC: Follow-up (cough) and Ankle Pain (edema)   HPI Toni Mcmillan presents for R ankle >L ankle pain since 2015. Dr Toni Mcmillan - ?Gout. On ASA. F/u DM, neuropathy  Outpatient Medications Prior to Visit  Medication Sig Dispense Refill  . aspirin EC 81 MG tablet Take 81 mg by mouth at bedtime.     Marland Kitchen aspirin-acetaminophen-caffeine (EXCEDRIN MIGRAINE) 250-250-65 MG per tablet Take 1-2 tablets by mouth every 6 (six) hours as needed for headache.    . Calcium Carbonate-Vit D-Min (CALCIUM 1200 PO) Take 1 tablet by mouth every morning.     . Cholecalciferol 1000 UNITS tablet Take 1,000 Units by mouth every morning.     Marland Kitchen dextromethorphan-guaiFENesin (MUCINEX DM) 30-600 MG 12hr tablet Take 1 tablet by mouth 2 (two) times daily as needed for cough. 14 tablet 0  . gabapentin (NEURONTIN) 100 MG capsule Take 1 capsule (100 mg total) by mouth 2 (two) times daily as needed (neuropathy). 60 capsule 5  . ipratropium (ATROVENT) 0.03 % nasal spray Place 2 sprays into both nostrils 2 (two) times daily. Do not use for Mcmillan than 5days. 30 mL 0  . metoprolol (LOPRESSOR) 50 MG tablet TAKE ONE AND ONE-HALF TABLETS BY MOUTH TWICE DAILY 270 tablet 1  . Multiple Vitamins-Minerals (MULTIVITAMIN,TX-MINERALS) tablet Take 1 tablet by mouth every morning.     . Omega-3 Fatty Acids (FISH OIL PO) Take 1 tablet by mouth every morning.    Marland Kitchen omeprazole (PRILOSEC) 20 MG capsule Take 1 capsule (20 mg total) by mouth daily. 30 capsule 0  . Polyvinyl Alcohol-Povidone (REFRESH OP) Apply 1-2 drops to eye daily as needed (dry eyes).    . ramipril (ALTACE) 2.5 MG capsule TAKE ONE CAPSULE BY MOUTH DAILY 90 capsule 3  . simvastatin (ZOCOR) 40 MG tablet TAKE 1 TABLET (40 MG TOTAL) BY MOUTH AT BEDTIME. 90 tablet 2  . vitamin C (ASCORBIC ACID) 500 MG tablet Take 500 mg by mouth 2 (two) times daily.    . Wheat Dextrin (BENEFIBER PO) Take 10  mLs by mouth daily.    Marland Kitchen zolpidem (AMBIEN) 10 MG tablet TAKE 1 TABLET BY MOUTH AT BEDTIME AS NEEDED FOR SLEEP 30 tablet 3   No facility-administered medications prior to visit.     ROS Review of Systems  Constitutional: Negative for activity change, appetite change, chills, fatigue and unexpected weight change.  HENT: Negative for congestion, mouth sores and sinus pressure.   Eyes: Negative for visual disturbance.  Respiratory: Negative for cough and chest tightness.   Gastrointestinal: Negative for abdominal pain and nausea.  Genitourinary: Negative for difficulty urinating, frequency and vaginal pain.  Musculoskeletal: Positive for arthralgias. Negative for back pain and gait problem.  Skin: Negative for pallor and rash.  Neurological: Negative for dizziness, tremors, weakness, numbness and headaches.  Psychiatric/Behavioral: Negative for confusion and sleep disturbance.    Objective:  BP 120/76   Pulse 64   Temp 98.4 F (36.9 C) (Oral)   Resp 16   Ht 5\' 2"  (1.575 m)   Wt 125 lb (56.7 kg)   SpO2 97%   BMI 22.86 kg/m   BP Readings from Last 3 Encounters:  01/10/17 120/76  12/21/16 110/78  12/10/16 114/74    Wt Readings from Last 3 Encounters:  01/10/17 125 lb (56.7 kg)  12/21/16 120 lb 8 oz (54.7 kg)  12/10/16 126 lb 12 oz (  57.5 kg)    Physical Exam  Constitutional: She appears well-developed. No distress.  HENT:  Head: Normocephalic.  Right Ear: External ear normal.  Left Ear: External ear normal.  Nose: Nose normal.  Mouth/Throat: Oropharynx is clear and moist.  Eyes: Conjunctivae are normal. Pupils are equal, round, and reactive to light. Right eye exhibits no discharge. Left eye exhibits no discharge.  Neck: Normal range of motion. Neck supple. No JVD present. No tracheal deviation present. No thyromegaly present.  Cardiovascular: Normal rate, regular rhythm and normal heart sounds.   Pulmonary/Chest: No stridor. No respiratory distress. She has no wheezes.   Abdominal: Soft. Bowel sounds are normal. She exhibits no distension and no mass. There is no tenderness. There is no rebound and no guarding.  Musculoskeletal: She exhibits tenderness. She exhibits no edema.  Lymphadenopathy:    She has no cervical adenopathy.  Neurological: She displays normal reflexes. No cranial nerve deficit. She exhibits normal muscle tone. Coordination normal.  Skin: No rash noted. No erythema.  Psychiatric: She has a normal mood and affect. Her behavior is normal. Judgment and thought content normal.  right ankle hurts a little w/ROM; no edema; pulses diminished; mild varicouse veins  Lab Results  Component Value Date   WBC 8.8 12/21/2016   HGB 12.0 12/21/2016   HCT 36.0 12/21/2016   PLT 378.0 12/21/2016   GLUCOSE 114 (H) 11/24/2016   CHOL 150 11/24/2016   TRIG 111.0 11/24/2016   HDL 54.20 11/24/2016   LDLCALC 74 11/24/2016   ALT 11 11/24/2016   AST 19 11/24/2016   NA 140 11/24/2016   K 4.5 11/24/2016   CL 104 11/24/2016   CREATININE 0.75 11/24/2016   BUN 19 11/24/2016   CO2 27 11/24/2016   TSH 1.06 11/24/2016   HGBA1C 6.4 11/24/2016    Dg Chest 2 View  Result Date: 12/21/2016 CLINICAL DATA:  Cough for 11 days, recent fever, fatigue, upper back pain for 3 days, history hypertension, diabetes mellitus EXAM: CHEST  2 VIEW COMPARISON:  09/11/2007 FINDINGS: Normal heart size, mediastinal contours, and pulmonary vascularity. Atherosclerotic calcification aorta. Lungs clear. No pleural effusion or pneumothorax. Bones unremarkable. IMPRESSION: No acute abnormalities. Aortic atherosclerosis. Electronically Signed   By: Toni SouthwardMark  Mcmillan M.D.   On: 12/21/2016 18:34    Assessment & Plan:   There are no diagnoses linked to this encounter. I am having Toni Mcmillan maintain her (multivitamin,tx-minerals), Calcium Carbonate-Vit D-Min (CALCIUM 1200 PO), aspirin EC, Cholecalciferol, Wheat Dextrin (BENEFIBER PO), Omega-3 Fatty Acids (FISH OIL PO),  aspirin-acetaminophen-caffeine, Polyvinyl Alcohol-Povidone (REFRESH OP), vitamin C, gabapentin, simvastatin, ramipril, zolpidem, metoprolol, ipratropium, dextromethorphan-guaiFENesin, and omeprazole.  No orders of the defined types were placed in this encounter.    Follow-up: No Follow-up on file.  Sonda PrimesAlex Plotnikov, MD

## 2017-01-13 NOTE — Addendum Note (Signed)
Addended by: Tresa GarterPLOTNIKOV, ALEKSEI V on: 01/13/2017 08:26 AM   Modules accepted: Orders

## 2017-01-13 NOTE — Telephone Encounter (Signed)
Ordered U/S

## 2017-01-13 NOTE — Telephone Encounter (Signed)
[facility] spoke w/javier on 2/26. The order he entered was not the correct order. radiologist is needing order change to a VAS US ABI w/wo TBI. Entered correct order she states now she can call to get pt set-up...Raechel Chute/lmb

## 2017-01-13 NOTE — Addendum Note (Signed)
Addended by: Deatra JamesBRAND, Gray Doering M on: 01/13/2017 01:25 PM   Modules accepted: Orders

## 2017-01-19 ENCOUNTER — Ambulatory Visit (HOSPITAL_COMMUNITY)
Admission: RE | Admit: 2017-01-19 | Discharge: 2017-01-19 | Disposition: A | Discharge status: Home / Self Care | Payer: Medicare Other | Source: Ambulatory Visit | Attending: Vascular Surgery | Admitting: Vascular Surgery

## 2017-01-19 DIAGNOSIS — R202 Paresthesia of skin: Secondary | ICD-10-CM

## 2017-01-19 DIAGNOSIS — M79672 Pain in left foot: Secondary | ICD-10-CM | POA: Diagnosis not present

## 2017-01-19 DIAGNOSIS — M79671 Pain in right foot: Secondary | ICD-10-CM | POA: Diagnosis not present

## 2017-01-19 DIAGNOSIS — E785 Hyperlipidemia, unspecified: Secondary | ICD-10-CM | POA: Diagnosis not present

## 2017-01-19 DIAGNOSIS — E1140 Type 2 diabetes mellitus with diabetic neuropathy, unspecified: Secondary | ICD-10-CM

## 2017-01-19 DIAGNOSIS — I10 Essential (primary) hypertension: Secondary | ICD-10-CM | POA: Diagnosis not present

## 2017-02-09 ENCOUNTER — Encounter: Payer: Self-pay | Admitting: Internal Medicine

## 2017-02-09 ENCOUNTER — Ambulatory Visit (INDEPENDENT_AMBULATORY_CARE_PROVIDER_SITE_OTHER): Payer: Medicare Other | Admitting: Internal Medicine

## 2017-02-09 DIAGNOSIS — I10 Essential (primary) hypertension: Secondary | ICD-10-CM

## 2017-02-09 DIAGNOSIS — M79671 Pain in right foot: Secondary | ICD-10-CM

## 2017-02-09 DIAGNOSIS — M79672 Pain in left foot: Secondary | ICD-10-CM | POA: Diagnosis not present

## 2017-02-09 DIAGNOSIS — E1140 Type 2 diabetes mellitus with diabetic neuropathy, unspecified: Secondary | ICD-10-CM | POA: Diagnosis not present

## 2017-02-09 NOTE — Assessment & Plan Note (Signed)
Metoprolol, Ramipril 

## 2017-02-09 NOTE — Progress Notes (Signed)
Subjective:  Patient ID: Toni MoreGertrude L Tardiff, female    DOB: 12/11/1930  Age: 83 y.o. MRN: [id number]  CC: Foot Pain (right foot/ankle 3 years ago ); Hypertension (stable ); Diabetes (type 2 ); and Loss of Consciousness   HPI Toni Mcmillan presents for leg pain f/u  Outpatient Medications Prior to Visit  Medication Sig Dispense Refill  . aspirin EC 81 MG tablet Take 81 mg by mouth at bedtime.     Marland Kitchen. aspirin-acetaminophen-caffeine (EXCEDRIN MIGRAINE) 250-250-65 MG per tablet Take 1-2 tablets by mouth every 6 (six) hours as needed for headache.    . Calcium Carbonate-Vit D-Min (CALCIUM 1200 PO) Take 1 tablet by mouth every morning.     . Cholecalciferol 1000 UNITS tablet Take 1,000 Units by mouth every morning.     . gabapentin (NEURONTIN) 100 MG capsule Take 1 capsule (100 mg total) by mouth 2 (two) times daily as needed (neuropathy). 60 capsule 5  . metoprolol (LOPRESSOR) 50 MG tablet TAKE ONE AND ONE-HALF TABLETS BY MOUTH TWICE DAILY 270 tablet 1  . Multiple Vitamins-Minerals (MULTIVITAMIN,TX-MINERALS) tablet Take 1 tablet by mouth every morning.     . Omega-3 Fatty Acids (FISH OIL PO) Take 1 tablet by mouth every morning.    . Polyvinyl Alcohol-Povidone (REFRESH OP) Apply 1-2 drops to eye daily as needed (dry eyes).    . ramipril (ALTACE) 2.5 MG capsule TAKE ONE CAPSULE BY MOUTH DAILY 90 capsule 3  . simvastatin (ZOCOR) 40 MG tablet TAKE 1 TABLET (40 MG TOTAL) BY MOUTH AT BEDTIME. 90 tablet 2  . vitamin C (ASCORBIC ACID) 500 MG tablet Take 500 mg by mouth 2 (two) times daily.    . Wheat Dextrin (BENEFIBER PO) Take 10 mLs by mouth daily.    Marland Kitchen. zolpidem (AMBIEN) 10 MG tablet TAKE 1 TABLET BY MOUTH AT BEDTIME AS NEEDED FOR SLEEP 30 tablet 3  . dextromethorphan-guaiFENesin (MUCINEX DM) 30-600 MG 12hr tablet Take 1 tablet by mouth 2 (two) times daily as needed for cough. 14 tablet 0  . ipratropium (ATROVENT) 0.03 % nasal spray Place 2 sprays into both nostrils 2 (two) times daily. Do not use  for more than 5days. 30 mL 0  . omeprazole (PRILOSEC) 20 MG capsule Take 1 capsule (20 mg total) by mouth daily. 30 capsule 0   No facility-administered medications prior to visit.     ROS Review of Systems  Constitutional: Negative for activity change, appetite change, chills, fatigue and unexpected weight change.  HENT: Negative for congestion, mouth sores and sinus pressure.   Eyes: Negative for visual disturbance.  Respiratory: Negative for cough and chest tightness.   Gastrointestinal: Negative for abdominal pain and nausea.  Genitourinary: Negative for difficulty urinating, frequency and vaginal pain.  Musculoskeletal: Negative for back pain and gait problem.  Skin: Negative for pallor and rash.  Neurological: Negative for dizziness, tremors, weakness, numbness and headaches.  Psychiatric/Behavioral: Negative for confusion, sleep disturbance and suicidal ideas.    Objective:  BP 128/80   Pulse (!) 59   Temp 98.1 F (36.7 C) (Oral)   Resp 16   Ht 5\' 2"  (1.575 m)   Wt 122 lb (55.3 kg)   SpO2 98%   BMI 22.31 kg/m   BP Readings from Last 3 Encounters:  02/09/17 128/80  01/10/17 120/76  12/21/16 110/78    Wt Readings from Last 3 Encounters:  02/09/17 122 lb (55.3 kg)  01/10/17 125 lb (56.7 kg)  12/21/16 120 lb 8 oz (54.7 kg)  Physical Exam  Constitutional: She appears well-developed. No distress.  HENT:  Head: Normocephalic.  Right Ear: External ear normal.  Left Ear: External ear normal.  Nose: Nose normal.  Mouth/Throat: Oropharynx is clear and moist.  Eyes: Conjunctivae are normal. Pupils are equal, round, and reactive to light. Right eye exhibits no discharge. Left eye exhibits no discharge.  Neck: Normal range of motion. Neck supple. No JVD present. No tracheal deviation present. No thyromegaly present.  Cardiovascular: Normal rate, regular rhythm and normal heart sounds.   Pulmonary/Chest: No stridor. No respiratory distress. She has no wheezes.    Abdominal: Soft. Bowel sounds are normal. She exhibits no distension and no mass. There is no tenderness. There is no rebound and no guarding.  Musculoskeletal: She exhibits no edema or tenderness.  Lymphadenopathy:    She has no cervical adenopathy.  Neurological: She displays normal reflexes. No cranial nerve deficit. She exhibits normal muscle tone. Coordination normal.  Skin: No rash noted. No erythema.  Psychiatric: She has a normal mood and affect. Her behavior is normal. Judgment and thought content normal.    Lab Results  Component Value Date   WBC 8.8 12/21/2016   HGB 12.0 12/21/2016   HCT 36.0 12/21/2016   PLT 378.0 12/21/2016   GLUCOSE 100 (H) 01/10/2017   CHOL 150 11/24/2016   TRIG 111.0 11/24/2016   HDL 54.20 11/24/2016   LDLCALC 74 11/24/2016   ALT 11 11/24/2016   AST 19 11/24/2016   NA 141 01/10/2017   K 4.3 01/10/2017   CL 105 01/10/2017   CREATININE 0.66 01/10/2017   BUN 12 01/10/2017   CO2 28 01/10/2017   TSH 1.06 11/24/2016   HGBA1C 6.4 11/24/2016    No results found.  Assessment & Plan:   There are no diagnoses linked to this encounter. I have discontinued Toni Mcmillan's ipratropium, dextromethorphan-guaiFENesin, and omeprazole. I am also having her maintain her (multivitamin,tx-minerals), Calcium Carbonate-Vit D-Min (CALCIUM 1200 PO), aspirin EC, Cholecalciferol, Wheat Dextrin (BENEFIBER PO), Omega-3 Fatty Acids (FISH OIL PO), aspirin-acetaminophen-caffeine, Polyvinyl Alcohol-Povidone (REFRESH OP), vitamin C, gabapentin, simvastatin, ramipril, zolpidem, and metoprolol.  No orders of the defined types were placed in this encounter.    Follow-up: No Follow-up on file.  Sonda Primes, MD

## 2017-02-09 NOTE — Assessment & Plan Note (Signed)
  On diet  

## 2017-02-09 NOTE — Progress Notes (Signed)
Pre-visit discussion using our clinic review tool. No additional management support is needed unless otherwise documented below in the visit note.  

## 2017-02-09 NOTE — Assessment & Plan Note (Addendum)
Gabapentin No PVD on Doppler 

## 2017-03-24 DIAGNOSIS — H1013 Acute atopic conjunctivitis, bilateral: Secondary | ICD-10-CM | POA: Insufficient documentation

## 2017-03-24 DIAGNOSIS — E113213 Type 2 diabetes mellitus with mild nonproliferative diabetic retinopathy with macular edema, bilateral: Secondary | ICD-10-CM | POA: Diagnosis not present

## 2017-03-24 DIAGNOSIS — H43823 Vitreomacular adhesion, bilateral: Secondary | ICD-10-CM | POA: Diagnosis not present

## 2017-03-24 DIAGNOSIS — Z961 Presence of intraocular lens: Secondary | ICD-10-CM | POA: Diagnosis not present

## 2017-04-30 ENCOUNTER — Other Ambulatory Visit: Payer: Self-pay | Admitting: Internal Medicine

## 2017-07-05 ENCOUNTER — Other Ambulatory Visit: Payer: Self-pay | Admitting: Internal Medicine

## 2017-08-02 ENCOUNTER — Other Ambulatory Visit: Payer: Self-pay | Admitting: Internal Medicine

## 2017-08-03 NOTE — Telephone Encounter (Signed)
Could not locate printed rx so I called refill into [facility] left on pharmacy vm...Raechel Chute

## 2017-08-06 ENCOUNTER — Other Ambulatory Visit: Payer: Self-pay | Admitting: Internal Medicine

## 2017-08-16 ENCOUNTER — Ambulatory Visit (INDEPENDENT_AMBULATORY_CARE_PROVIDER_SITE_OTHER): Payer: Medicare Other | Admitting: Internal Medicine

## 2017-08-16 ENCOUNTER — Other Ambulatory Visit (INDEPENDENT_AMBULATORY_CARE_PROVIDER_SITE_OTHER): Payer: Medicare Other

## 2017-08-16 ENCOUNTER — Encounter: Payer: Self-pay | Admitting: Internal Medicine

## 2017-08-16 VITALS — BP 132/74 | HR 53 | Temp 98.5°F | Ht 62.0 in | Wt 121.0 lb

## 2017-08-16 DIAGNOSIS — M79674 Pain in right toe(s): Secondary | ICD-10-CM

## 2017-08-16 DIAGNOSIS — Z23 Encounter for immunization: Secondary | ICD-10-CM | POA: Diagnosis not present

## 2017-08-16 DIAGNOSIS — I10 Essential (primary) hypertension: Secondary | ICD-10-CM | POA: Diagnosis not present

## 2017-08-16 DIAGNOSIS — M79676 Pain in unspecified toe(s): Secondary | ICD-10-CM | POA: Insufficient documentation

## 2017-08-16 DIAGNOSIS — E1140 Type 2 diabetes mellitus with diabetic neuropathy, unspecified: Secondary | ICD-10-CM

## 2017-08-16 DIAGNOSIS — E785 Hyperlipidemia, unspecified: Secondary | ICD-10-CM

## 2017-08-16 LAB — HEMOGLOBIN A1C: HEMOGLOBIN A1C: 6.2 % (ref 4.6–6.5)

## 2017-08-16 LAB — BASIC METABOLIC PANEL
BUN: 12 mg/dL (ref 6–23)
CO2: 31 mEq/L (ref 19–32)
Calcium: 9.8 mg/dL (ref 8.4–10.5)
Chloride: 103 mEq/L (ref 96–112)
Creatinine, Ser: 0.64 mg/dL (ref 0.40–1.20)
GFR: 93.87 mL/min (ref >60.00)
Glucose, Bld: 109 mg/dL — ABNORMAL HIGH (ref 70–99)
POTASSIUM: 4.6 meq/L (ref 3.5–5.1)
SODIUM: 139 meq/L (ref 135–145)

## 2017-08-16 LAB — URIC ACID: URIC ACID, SERUM: 5.1 mg/dL (ref 2.4–7.0)

## 2017-08-16 LAB — SEDIMENTATION RATE: Sed Rate: 31 mm/hr — ABNORMAL HIGH (ref 0–30)

## 2017-08-16 MED ORDER — CEPHALEXIN 500 MG PO CAPS: 500.0000 mg | ORAL_CAPSULE | Freq: Four times a day (QID) | ORAL | 0 refills | Status: DC

## 2017-08-16 MED ORDER — DOXYCYCLINE HYCLATE 100 MG PO TABS: 100.0000 mg | ORAL_TABLET | Freq: Two times a day (BID) | ORAL | 0 refills | Status: DC

## 2017-08-16 NOTE — Progress Notes (Signed)
Subjective:  Patient ID: Toni Mcmillan, female    DOB: 1931/06/25  Age: 83 y.o. MRN: [id number]  CC: No chief complaint on file.   HPI Toni Mcmillan presents for 6 mo f/u: HTN, dyslipidemia C/o R toe #4 itching and swelling x 4 d - not better  Outpatient Medications Prior to Visit  Medication Sig Dispense Refill  . aspirin EC 81 MG tablet Take 81 mg by mouth at bedtime.     Marland Kitchen aspirin-acetaminophen-caffeine (EXCEDRIN MIGRAINE) 250-250-65 MG per tablet Take 1-2 tablets by mouth every 6 (six) hours as needed for headache.    . Calcium Carbonate-Vit D-Min (CALCIUM 1200 PO) Take 1 tablet by mouth every morning.     . Cholecalciferol 1000 UNITS tablet Take 1,000 Units by mouth every morning.     . gabapentin (NEURONTIN) 100 MG capsule Take 1 capsule (100 mg total) by mouth 2 (two) times daily as needed (neuropathy). 60 capsule 5  . metoprolol tartrate (LOPRESSOR) 50 MG tablet TAKE ONE AND ONE-HALF (1 & 1/2) TABLET BY MOUTH TWO TIMES A DAY 270 tablet 3  . Multiple Vitamins-Minerals (MULTIVITAMIN,TX-MINERALS) tablet Take 1 tablet by mouth every morning.     . Omega-3 Fatty Acids (FISH OIL PO) Take 1 tablet by mouth every morning.    . Polyvinyl Alcohol-Povidone (REFRESH OP) Apply 1-2 drops to eye daily as needed (dry eyes).    . ramipril (ALTACE) 2.5 MG capsule TAKE ONE CAPSULE BY MOUTH DAILY 90 capsule 0  . simvastatin (ZOCOR) 40 MG tablet TAKE 1 TABLET (40 MG TOTAL) BY MOUTH AT BEDTIME. 90 tablet 1  . vitamin C (ASCORBIC ACID) 500 MG tablet Take 500 mg by mouth 2 (two) times daily.    . Wheat Dextrin (BENEFIBER PO) Take 10 mLs by mouth daily.    Marland Kitchen zolpidem (AMBIEN) 10 MG tablet TAKE ONE TABLET BY MOUTH EVERY NIGHT AT BEDTIME AS NEEDED FOR SLEEP 30 tablet 2   No facility-administered medications prior to visit.     ROS Review of Systems  Constitutional: Negative for activity change, appetite change, chills, fatigue and unexpected weight change.  HENT: Negative for congestion, mouth  sores and sinus pressure.   Eyes: Negative for visual disturbance.  Respiratory: Negative for cough and chest tightness.   Gastrointestinal: Negative for abdominal pain and nausea.  Genitourinary: Negative for difficulty urinating, frequency and vaginal pain.  Musculoskeletal: Positive for arthralgias. Negative for back pain and gait problem.  Skin: Positive for color change. Negative for pallor and rash.  Neurological: Negative for dizziness, tremors, weakness, numbness and headaches.  Psychiatric/Behavioral: Negative for confusion and sleep disturbance.    Objective:  BP 132/74 (BP Location: Left Arm, Patient Position: Sitting, Cuff Size: Large)   Pulse (!) 53   Temp 98.5 F (36.9 C) (Oral)   Ht  (1.575 m)   Wt 121 lb (54.9 kg)   SpO2 98%   BMI 22.13 kg/m   BP Readings from Last 3 Encounters:  08/16/17 132/74  02/09/17 128/80  01/10/17 120/76    Wt Readings from Last 3 Encounters:  08/16/17 121 lb (54.9 kg)  02/09/17 122 lb (55.3 kg)  01/10/17 125 lb (56.7 kg)    Physical Exam  Constitutional: She appears well-developed. No distress.  HENT:  Head: Normocephalic.  Right Ear: External ear normal.  Left Ear: External ear normal.  Nose: Nose normal.  Mouth/Throat: Oropharynx is clear and moist.  Eyes: Pupils are equal, round, and reactive to light. Conjunctivae are normal. Right  eye exhibits no discharge. Left eye exhibits no discharge.  Neck: Normal range of motion. Neck supple. No JVD present. No tracheal deviation present. No thyromegaly present.  Cardiovascular: Normal rate, regular rhythm and normal heart sounds.   Pulmonary/Chest: No stridor. No respiratory distress. She has no wheezes.  Abdominal: Soft. Bowel sounds are normal. She exhibits no distension and no mass. There is no tenderness. There is no rebound and no guarding.  Musculoskeletal: She exhibits no edema or tenderness.  Lymphadenopathy:    She has no cervical adenopathy.  Neurological: She  displays normal reflexes. No cranial nerve deficit. She exhibits normal muscle tone. Coordination normal.  Skin: No rash noted. There is erythema.  Psychiatric: She has a normal mood and affect. Her behavior is normal. Judgment and thought content normal.      R toe #4 MTP and prox area bruising and swelling  Lab Results  Component Value Date   WBC 8.8 12/21/2016   HGB 12.0 12/21/2016   HCT 36.0 12/21/2016   PLT 378.0 12/21/2016   GLUCOSE 100 (H) 01/10/2017   CHOL 150 11/24/2016   TRIG 111.0 11/24/2016   HDL 54.20 11/24/2016   LDLCALC 74 11/24/2016   ALT 11 11/24/2016   AST 19 11/24/2016   NA 141 01/10/2017   K 4.3 01/10/2017   CL 105 01/10/2017   CREATININE 0.66 01/10/2017   BUN 12 01/10/2017   CO2 28 01/10/2017   TSH 1.06 11/24/2016   HGBA1C 6.4 11/24/2016    No results found.  Assessment & Plan:   There are no diagnoses linked to this encounter. I am having Toni Mcmillan maintain her (multivitamin,tx-minerals), Calcium Carbonate-Vit D-Min (CALCIUM 1200 PO), aspirin EC, Cholecalciferol, Wheat Dextrin (BENEFIBER PO), Omega-3 Fatty Acids (FISH OIL PO), aspirin-acetaminophen-caffeine, Polyvinyl Alcohol-Povidone (REFRESH OP), vitamin C, gabapentin, simvastatin, metoprolol tartrate, zolpidem, and ramipril.  No orders of the defined types were placed in this encounter.    Follow-up: No Follow-up on file.  Sonda Primes, MD

## 2017-08-16 NOTE — Assessment & Plan Note (Signed)
R toe #4 itching and swelling x 4 d - ?etiology - insect bite vs other Appt w/Dr Charlsie Merles on Fri Pt declined X ray PO abx if worse

## 2017-08-16 NOTE — Assessment & Plan Note (Signed)
Labs

## 2017-08-16 NOTE — Assessment & Plan Note (Signed)
On Simvastatins

## 2017-08-16 NOTE — Assessment & Plan Note (Signed)
Cont w/Metoprolol, Ramipril

## 2017-08-19 ENCOUNTER — Other Ambulatory Visit: Payer: Self-pay | Admitting: Podiatry

## 2017-08-19 ENCOUNTER — Ambulatory Visit (INDEPENDENT_AMBULATORY_CARE_PROVIDER_SITE_OTHER): Payer: Medicare Other | Admitting: Podiatry

## 2017-08-19 ENCOUNTER — Encounter: Payer: Self-pay | Admitting: Podiatry

## 2017-08-19 ENCOUNTER — Ambulatory Visit (INDEPENDENT_AMBULATORY_CARE_PROVIDER_SITE_OTHER): Payer: Medicare Other

## 2017-08-19 VITALS — BP 144/86 | HR 70 | Resp 16

## 2017-08-19 DIAGNOSIS — M25571 Pain in right ankle and joints of right foot: Secondary | ICD-10-CM

## 2017-08-19 DIAGNOSIS — M779 Enthesopathy, unspecified: Secondary | ICD-10-CM

## 2017-08-19 MED ORDER — TRIAMCINOLONE ACETONIDE 10 MG/ML IJ SUSP
10.0000 mg | Freq: Once | INTRAMUSCULAR | Status: AC
  Administered 2017-08-19: 10 mg

## 2017-08-19 NOTE — Progress Notes (Signed)
   Subjective:    Patient ID: Toni Mcmillan, female    DOB: [birth year], 83 y.o.   MRN: [id number]  HPI    Review of Systems  All other systems reviewed and are negative.      Objective:   Physical Exam        Assessment & Plan:

## 2017-09-15 ENCOUNTER — Ambulatory Visit (INDEPENDENT_AMBULATORY_CARE_PROVIDER_SITE_OTHER): Payer: Medicare Other | Admitting: Podiatry

## 2017-09-15 DIAGNOSIS — L03031 Cellulitis of right toe: Secondary | ICD-10-CM | POA: Diagnosis not present

## 2017-09-15 DIAGNOSIS — M779 Enthesopathy, unspecified: Secondary | ICD-10-CM

## 2017-09-15 DIAGNOSIS — L02611 Cutaneous abscess of right foot: Secondary | ICD-10-CM

## 2017-09-15 MED ORDER — TRIAMCINOLONE ACETONIDE 10 MG/ML IJ SUSP
10.0000 mg | Freq: Once | INTRAMUSCULAR | Status: AC
  Administered 2017-09-15: 10 mg

## 2017-09-15 NOTE — Progress Notes (Signed)
Subjective:    Patient ID: Toni Mcmillan, female   DOB: 83 y.o.   MRN: [id number]   HPI patient states the area with the injection seems better but hurts more on the outside of the ankle and also I had an infection my right fifth toe and I been on an antibiotic and I wanted it checked    ROS      Objective:  Physical Exam neurovascular status intact with mild localized redness fourth interspace right with crusted tissue fifth digit and pain more in the lateral ankle gutter and less in the sinus tarsi right     Assessment:   Improved sinus tarsitis right with inflammatory tendinitis lateral ankle gutter and infection right which appears to be resolving      Plan:   H&P conditions reviewed at great length. At this point I want her to continue and finish antibiotics and soaks and I did a careful lateral ankle gutter right injection with 3 mg Kenalog 5 mg Xylocaine. I did discuss that if the area on the fifth digit were to become red again I want to see her back and ultimately may require surgery for condition

## 2017-10-18 DIAGNOSIS — M25571 Pain in right ankle and joints of right foot: Secondary | ICD-10-CM | POA: Diagnosis not present

## 2017-10-18 DIAGNOSIS — M25572 Pain in left ankle and joints of left foot: Secondary | ICD-10-CM | POA: Diagnosis not present

## 2017-10-18 DIAGNOSIS — M5136 Other intervertebral disc degeneration, lumbar region: Secondary | ICD-10-CM | POA: Diagnosis not present

## 2017-10-18 DIAGNOSIS — M545 Low back pain: Secondary | ICD-10-CM | POA: Diagnosis not present

## 2017-10-19 DIAGNOSIS — Z6822 Body mass index (BMI) 22.0-22.9, adult: Secondary | ICD-10-CM | POA: Diagnosis not present

## 2017-10-19 DIAGNOSIS — Z01419 Encounter for gynecological examination (general) (routine) without abnormal findings: Secondary | ICD-10-CM | POA: Diagnosis not present

## 2017-10-19 DIAGNOSIS — Z1231 Encounter for screening mammogram for malignant neoplasm of breast: Secondary | ICD-10-CM | POA: Diagnosis not present

## 2017-10-19 DIAGNOSIS — M816 Localized osteoporosis [Lequesne]: Secondary | ICD-10-CM | POA: Diagnosis not present

## 2017-10-29 ENCOUNTER — Other Ambulatory Visit: Payer: Self-pay | Admitting: Internal Medicine

## 2017-10-31 DIAGNOSIS — M5136 Other intervertebral disc degeneration, lumbar region: Secondary | ICD-10-CM | POA: Diagnosis not present

## 2017-11-04 DIAGNOSIS — M5136 Other intervertebral disc degeneration, lumbar region: Secondary | ICD-10-CM | POA: Diagnosis not present

## 2017-11-07 IMAGING — DX DG CHEST 2V
2 series · 2 of 2 positions shown · non-contrast
Comparison: 09/11/2007

CLINICAL DATA: Cough for 11 days, recent fever, fatigue, upper back
pain for 3 days, history hypertension, diabetes mellitus

EXAM:
CHEST  2 VIEW

[chest pa]
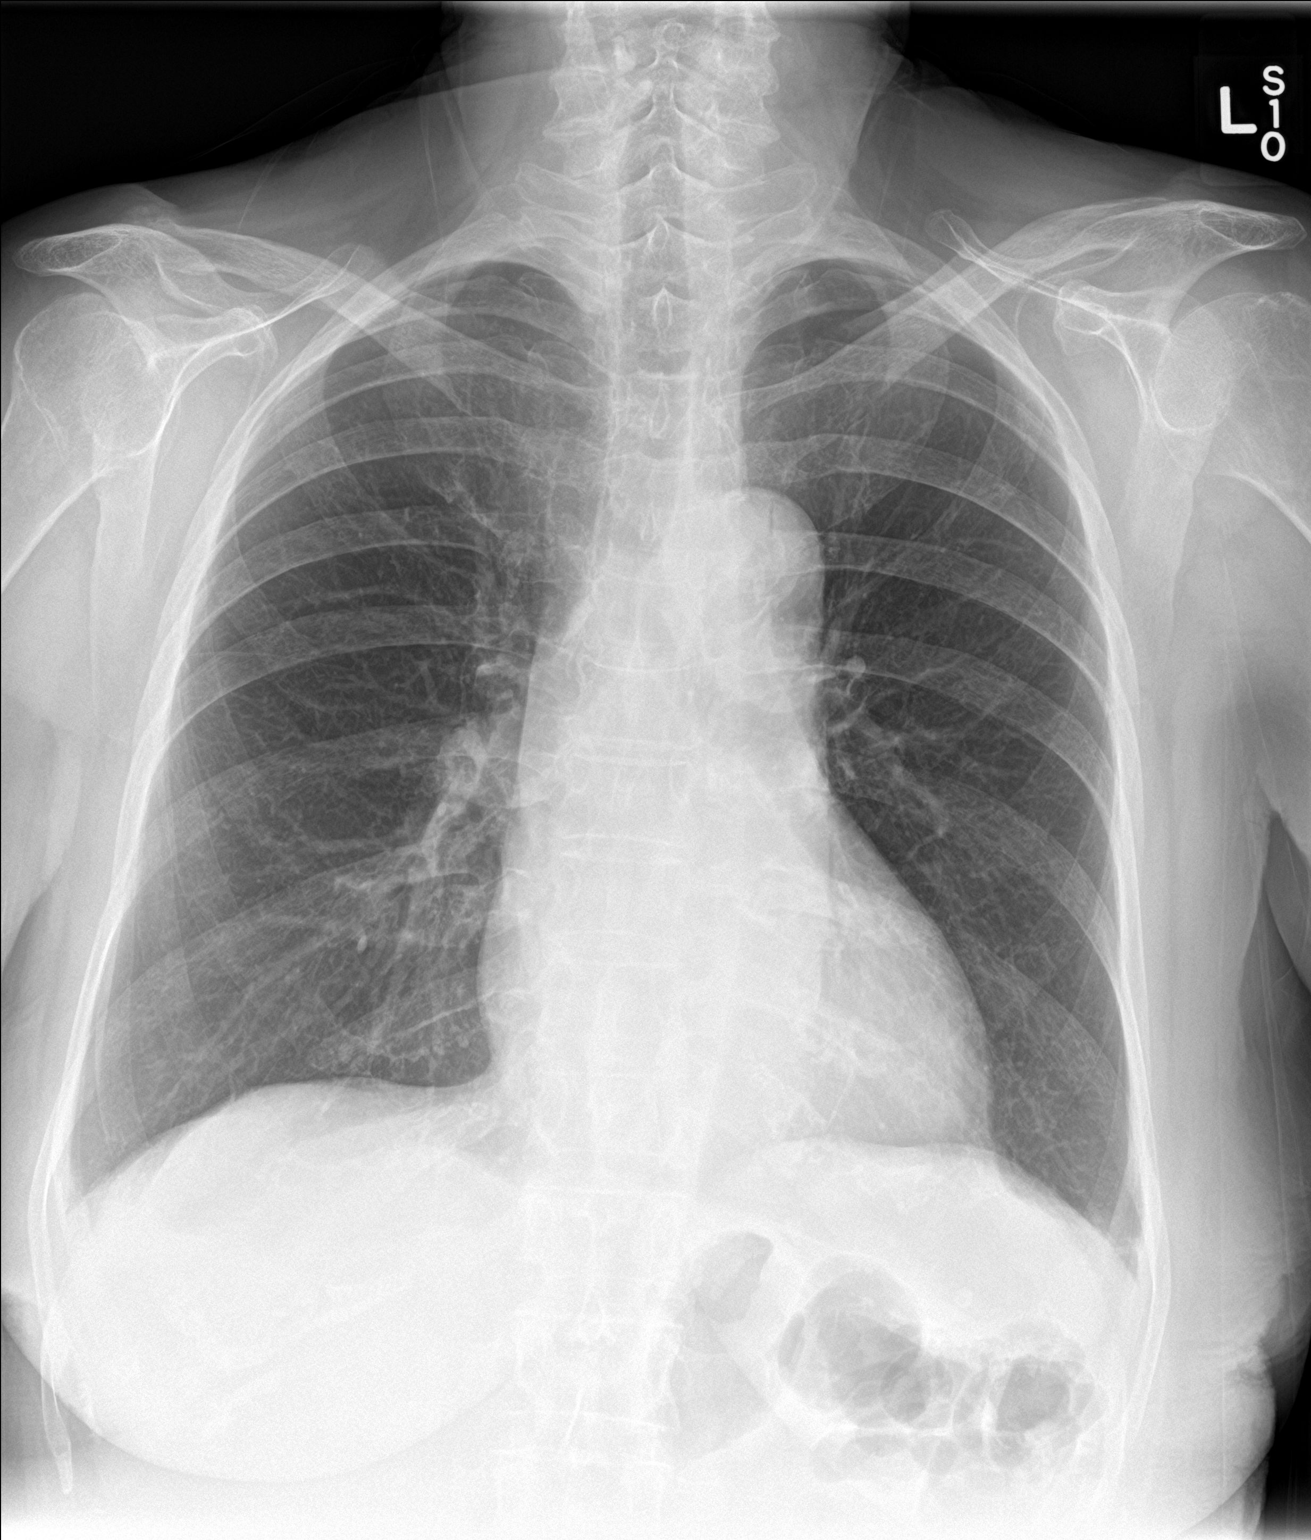

[chest lat]
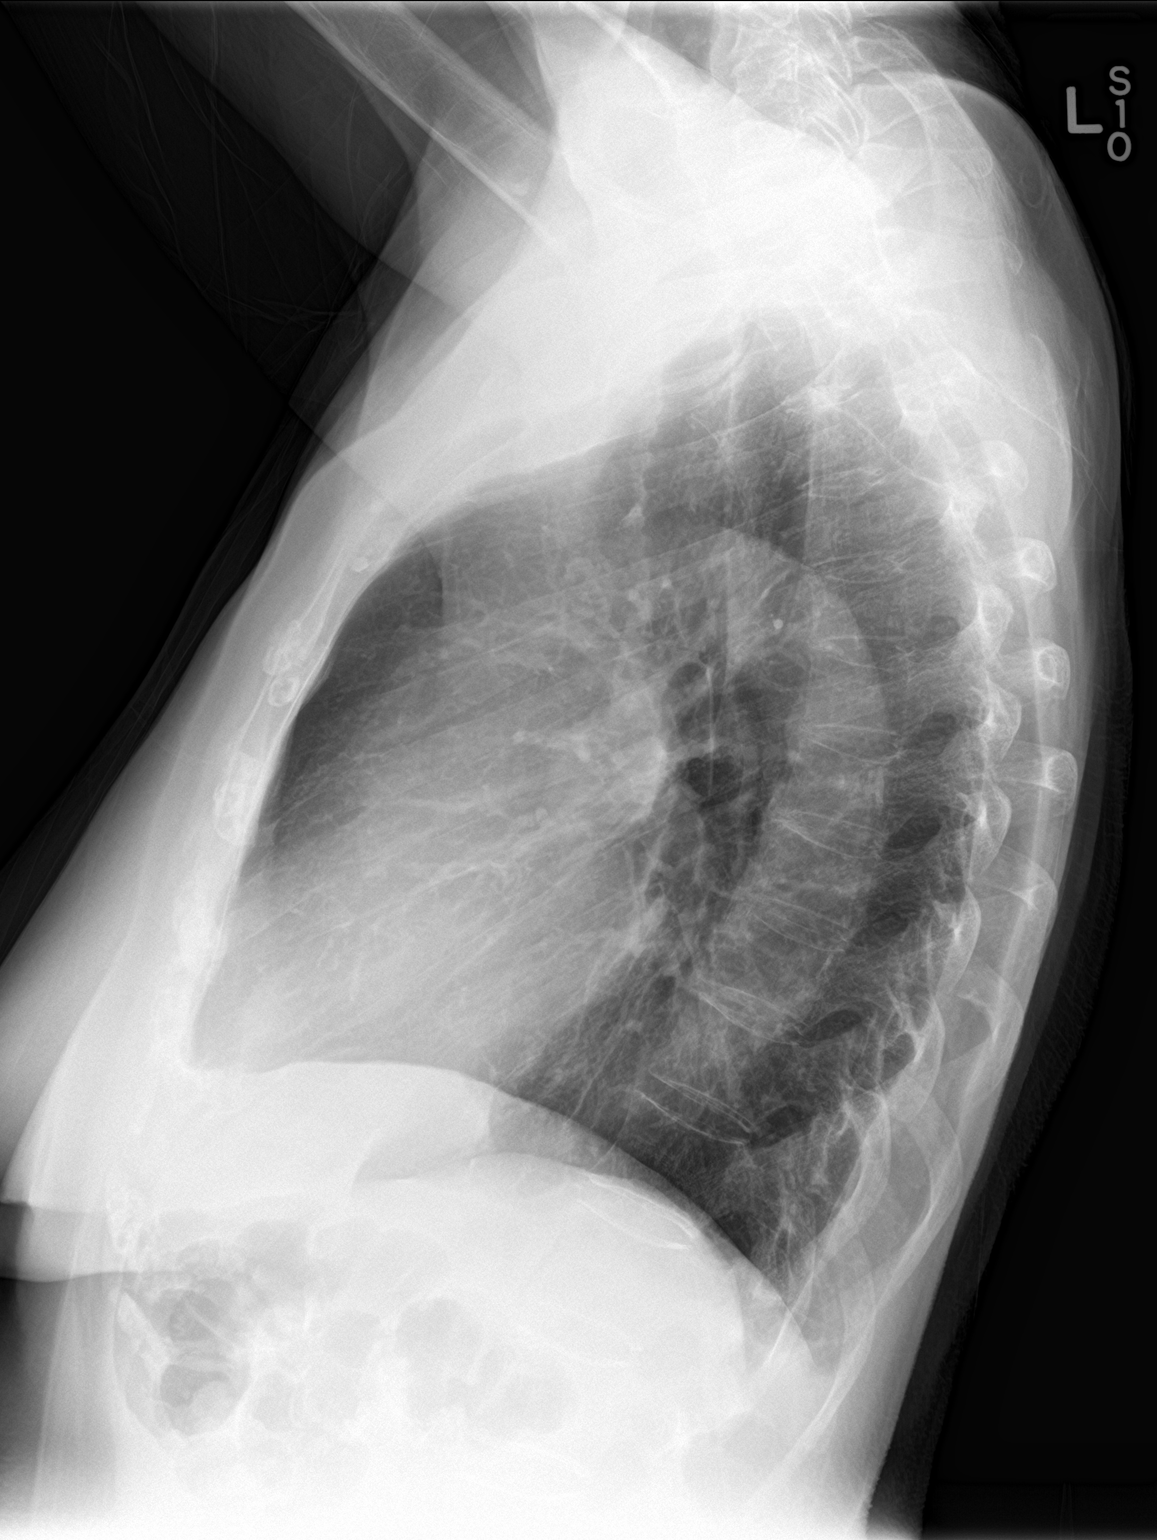

[2 of 2 positions shown; findings below may reference images not displayed]

FINDINGS: Normal heart size, mediastinal contours, and pulmonary vascularity.

Atherosclerotic calcification aorta.

Lungs clear.

No pleural effusion or pneumothorax.

Bones unremarkable.
IMPRESSION: No acute abnormalities.

Aortic atherosclerosis.

## 2017-11-09 DIAGNOSIS — M5136 Other intervertebral disc degeneration, lumbar region: Secondary | ICD-10-CM | POA: Diagnosis not present

## 2017-11-11 DIAGNOSIS — M5136 Other intervertebral disc degeneration, lumbar region: Secondary | ICD-10-CM | POA: Diagnosis not present

## 2017-11-16 DIAGNOSIS — M25571 Pain in right ankle and joints of right foot: Secondary | ICD-10-CM | POA: Diagnosis not present

## 2017-11-24 DIAGNOSIS — H04123 Dry eye syndrome of bilateral lacrimal glands: Secondary | ICD-10-CM | POA: Diagnosis not present

## 2017-11-24 DIAGNOSIS — Z961 Presence of intraocular lens: Secondary | ICD-10-CM | POA: Diagnosis not present

## 2017-11-24 DIAGNOSIS — E113213 Type 2 diabetes mellitus with mild nonproliferative diabetic retinopathy with macular edema, bilateral: Secondary | ICD-10-CM | POA: Diagnosis not present

## 2017-11-24 DIAGNOSIS — H43823 Vitreomacular adhesion, bilateral: Secondary | ICD-10-CM | POA: Diagnosis not present

## 2017-11-28 ENCOUNTER — Encounter: Payer: Self-pay | Admitting: Internal Medicine

## 2017-11-28 ENCOUNTER — Ambulatory Visit (INDEPENDENT_AMBULATORY_CARE_PROVIDER_SITE_OTHER): Payer: Medicare Other | Admitting: Internal Medicine

## 2017-11-28 VITALS — BP 132/82 | HR 58 | Temp 98.2°F | Ht 62.0 in | Wt 118.0 lb

## 2017-11-28 DIAGNOSIS — Z0000 Encounter for general adult medical examination without abnormal findings: Secondary | ICD-10-CM

## 2017-11-28 DIAGNOSIS — F5101 Primary insomnia: Secondary | ICD-10-CM | POA: Diagnosis not present

## 2017-11-28 DIAGNOSIS — I10 Essential (primary) hypertension: Secondary | ICD-10-CM

## 2017-11-28 DIAGNOSIS — R202 Paresthesia of skin: Secondary | ICD-10-CM | POA: Diagnosis not present

## 2017-11-28 DIAGNOSIS — E1140 Type 2 diabetes mellitus with diabetic neuropathy, unspecified: Secondary | ICD-10-CM | POA: Diagnosis not present

## 2017-11-28 DIAGNOSIS — E785 Hyperlipidemia, unspecified: Secondary | ICD-10-CM | POA: Diagnosis not present

## 2017-11-28 NOTE — Assessment & Plan Note (Signed)
  On diet  

## 2017-11-28 NOTE — Assessment & Plan Note (Signed)
Simvastatin 

## 2017-11-28 NOTE — Assessment & Plan Note (Signed)
On Gabapentin 

## 2017-11-28 NOTE — Assessment & Plan Note (Signed)
Metoprolol, Ramipril 

## 2017-11-28 NOTE — Assessment & Plan Note (Signed)

## 2017-11-28 NOTE — Progress Notes (Signed)
Subjective:  Patient ID: Toni Mcmillan, female    DOB: 10/21/1931  Age: 84 y.o. MRN: [id number]  CC: No chief complaint on file.   HPI Toni MoreGertrude L Peugh presents for well exam  Outpatient Medications Prior to Visit  Medication Sig Dispense Refill  . aspirin EC 81 MG tablet Take 81 mg by mouth at bedtime.     Marland Kitchen. aspirin-acetaminophen-caffeine (EXCEDRIN MIGRAINE) 250-250-65 MG per tablet Take 1-2 tablets by mouth every 6 (six) hours as needed for headache.    . Calcium Carbonate-Vit D-Min (CALCIUM 1200 PO) Take 1 tablet by mouth every morning.     . cephALEXin (KEFLEX) 500 MG capsule Take 1 capsule (500 mg total) by mouth 4 (four) times daily. 28 capsule 0  . Cholecalciferol 1000 UNITS tablet Take 1,000 Units by mouth every morning.     . gabapentin (NEURONTIN) 100 MG capsule Take 1 capsule (100 mg total) by mouth 2 (two) times daily as needed (neuropathy). 60 capsule 5  . metoprolol tartrate (LOPRESSOR) 50 MG tablet TAKE ONE AND ONE-HALF (1 & 1/2) TABLET BY MOUTH TWO TIMES A DAY 270 tablet 3  . Multiple Vitamins-Minerals (MULTIVITAMIN,TX-MINERALS) tablet Take 1 tablet by mouth every morning.     . Omega-3 Fatty Acids (FISH OIL PO) Take 1 tablet by mouth every morning.    . Polyvinyl Alcohol-Povidone (REFRESH OP) Apply 1-2 drops to eye daily as needed (dry eyes).    . ramipril (ALTACE) 2.5 MG capsule TAKE ONE CAPSULE BY MOUTH DAILY 90 capsule 0  . simvastatin (ZOCOR) 40 MG tablet TAKE ONE TABLET BY MOUTH EVERY NIGHT AT BEDTIME 90 tablet 0  . vitamin C (ASCORBIC ACID) 500 MG tablet Take 500 mg by mouth 2 (two) times daily.    . Wheat Dextrin (BENEFIBER PO) Take 10 mLs by mouth daily.    Marland Kitchen. zolpidem (AMBIEN) 10 MG tablet TAKE ONE TABLET BY MOUTH EVERY NIGHT AT BEDTIME AS NEEDED FOR SLEEP 30 tablet 2   No facility-administered medications prior to visit.     ROS Review of Systems  Constitutional: Negative for activity change, appetite change, chills, fatigue and unexpected weight  change.  HENT: Negative for congestion, mouth sores and sinus pressure.   Eyes: Negative for visual disturbance.  Respiratory: Negative for cough and chest tightness.   Gastrointestinal: Negative for abdominal pain and nausea.  Genitourinary: Negative for difficulty urinating, frequency and vaginal pain.  Musculoskeletal: Negative for back pain and gait problem.  Skin: Negative for pallor and rash.  Neurological: Negative for dizziness, tremors, weakness, numbness and headaches.  Psychiatric/Behavioral: Negative for confusion and sleep disturbance.    Objective:  BP 132/82 (BP Location: Left Arm, Patient Position: Sitting, Cuff Size: Normal)   Pulse (!) 58   Temp 98.2 F (36.8 C) (Oral)   Ht 5\' 2"  (1.575 m)   Wt 118 lb (53.5 kg)   SpO2 98%   BMI 21.58 kg/m   BP Readings from Last 3 Encounters:  11/28/17 132/82  08/19/17 (!) 144/86  08/16/17 132/74    Wt Readings from Last 3 Encounters:  11/28/17 118 lb (53.5 kg)  08/16/17 121 lb (54.9 kg)  02/09/17 122 lb (55.3 kg)    Physical Exam  Constitutional: She appears well-developed. No distress.  HENT:  Head: Normocephalic.  Right Ear: External ear normal.  Left Ear: External ear normal.  Nose: Nose normal.  Mouth/Throat: Oropharynx is clear and moist.  Eyes: Conjunctivae are normal. Pupils are equal, round, and reactive to light. Right eye  exhibits no discharge. Left eye exhibits no discharge.  Neck: Normal range of motion. Neck supple. No JVD present. No tracheal deviation present. No thyromegaly present.  Cardiovascular: Normal rate, regular rhythm and normal heart sounds.  Pulmonary/Chest: No stridor. No respiratory distress. She has no wheezes.  Abdominal: Soft. Bowel sounds are normal. She exhibits no distension and no mass. There is no tenderness. There is no rebound and no guarding.  Musculoskeletal: She exhibits no edema or tenderness.  Lymphadenopathy:    She has no cervical adenopathy.  Neurological: She  displays normal reflexes. No cranial nerve deficit. She exhibits normal muscle tone. Coordination abnormal.  Skin: No rash noted. No erythema.  Psychiatric: She has a normal mood and affect. Her behavior is normal. Judgment and thought content normal.  cane  Lab Results  Component Value Date   WBC 8.8 12/21/2016   HGB 12.0 12/21/2016   HCT 36.0 12/21/2016   PLT 378.0 12/21/2016   GLUCOSE 109 (H) 08/16/2017   CHOL 150 11/24/2016   TRIG 111.0 11/24/2016   HDL 54.20 11/24/2016   LDLCALC 74 11/24/2016   ALT 11 11/24/2016   AST 19 11/24/2016   NA 139 08/16/2017   K 4.6 08/16/2017   CL 103 08/16/2017   CREATININE 0.64 08/16/2017   BUN 12 08/16/2017   CO2 31 08/16/2017   TSH 1.06 11/24/2016   HGBA1C 6.2 08/16/2017    No results found.  Assessment & Plan:   There are no diagnoses linked to this encounter. I am having Tressia L. Rebel maintain her (multivitamin,tx-minerals), Calcium Carbonate-Vit D-Min (CALCIUM 1200 PO), aspirin EC, Cholecalciferol, Wheat Dextrin (BENEFIBER PO), Omega-3 Fatty Acids (FISH OIL PO), aspirin-acetaminophen-caffeine, Polyvinyl Alcohol-Povidone (REFRESH OP), vitamin C, gabapentin, metoprolol tartrate, zolpidem, cephALEXin, ramipril, and simvastatin.  No orders of the defined types were placed in this encounter.    Follow-up: No Follow-up on file.  Sonda Primes, MD

## 2017-11-28 NOTE — Assessment & Plan Note (Signed)
Zolpidem prn  Potential benefits of a long term benzodiazepines  use as well as potential risks  and complications were explained to the patient and were aknowledged. 

## 2017-11-28 NOTE — Patient Instructions (Addendum)
Turmeric tablets to try Health Maintenance for Postmenopausal Women Menopause is a normal process in which your reproductive ability comes to an end. This process happens gradually over a span of months to years, usually between the ages of 68 and 70. Menopause is complete when you have missed 12 consecutive menstrual periods. It is important to talk with your health care provider about some of the most common conditions that affect postmenopausal women, such as heart disease, cancer, and bone loss (osteoporosis). Adopting a healthy lifestyle and getting preventive care can help to promote your health and wellness. Those actions can also lower your chances of developing some of these common conditions. What should I know about menopause? During menopause, you may experience a number of symptoms, such as:  Moderate-to-severe hot flashes.  Night sweats.  Decrease in sex drive.  Mood swings.  Headaches.  Tiredness.  Irritability.  Memory problems.  Insomnia.  Choosing to treat or not to treat menopausal changes is an individual decision that you make with your health care provider. What should I know about hormone replacement therapy and supplements? Hormone therapy products are effective for treating symptoms that are associated with menopause, such as hot flashes and night sweats. Hormone replacement carries certain risks, especially as you become older. If you are thinking about using estrogen or estrogen with progestin treatments, discuss the benefits and risks with your health care provider. What should I know about heart disease and stroke? Heart disease, heart attack, and stroke become more likely as you age. This may be due, in part, to the hormonal changes that your body experiences during menopause. These can affect how your body processes dietary fats, triglycerides, and cholesterol. Heart attack and stroke are both medical emergencies. There are many things that you can do to  help prevent heart disease and stroke:  Have your blood pressure checked at least every 1-2 years. High blood pressure causes heart disease and increases the risk of stroke.  If you are 67-25 years old, ask your health care provider if you should take aspirin to prevent a heart attack or a stroke.  Do not use any tobacco products, including cigarettes, chewing tobacco, or electronic cigarettes. If you need help quitting, ask your health care provider.  It is important to eat a healthy diet and maintain a healthy weight. ? Be sure to include plenty of vegetables, fruits, low-fat dairy products, and lean protein. ? Avoid eating foods that are high in solid fats, added sugars, or salt (sodium).  Get regular exercise. This is one of the most important things that you can do for your health. ? Try to exercise for at least 150 minutes each week. The type of exercise that you do should increase your heart rate and make you sweat. This is known as moderate-intensity exercise. ? Try to do strengthening exercises at least twice each week. Do these in addition to the moderate-intensity exercise.  Know your numbers.Ask your health care provider to check your cholesterol and your blood glucose. Continue to have your blood tested as directed by your health care provider.  What should I know about cancer screening? There are several types of cancer. Take the following steps to reduce your risk and to catch any cancer development as early as possible. Breast Cancer  Practice breast self-awareness. ? This means understanding how your breasts normally appear and feel. ? It also means doing regular breast self-exams. Let your health care provider know about any changes, no matter how small.  If you are 40 or older, have a clinician do a breast exam (clinical breast exam or CBE) every year. Depending on your age, family history, and medical history, it may be recommended that you also have a yearly breast  X-ray (mammogram).  If you have a family history of breast cancer, talk with your health care provider about genetic screening.  If you are at high risk for breast cancer, talk with your health care provider about having an MRI and a mammogram every year.  Breast cancer (BRCA) gene test is recommended for women who have family members with BRCA-related cancers. Results of the assessment will determine the need for genetic counseling and BRCA1 and for BRCA2 testing. BRCA-related cancers include these types: ? Breast. This occurs in males or females. ? Ovarian. ? Tubal. This may also be called fallopian tube cancer. ? Cancer of the abdominal or pelvic lining (peritoneal cancer). ? Prostate. ? Pancreatic.  Cervical, Uterine, and Ovarian Cancer Your health care provider may recommend that you be screened regularly for cancer of the pelvic organs. These include your ovaries, uterus, and vagina. This screening involves a pelvic exam, which includes checking for microscopic changes to the surface of your cervix (Pap test).  For women ages 21-65, health care providers may recommend a pelvic exam and a Pap test every three years. For women ages 30-65, they may recommend the Pap test and pelvic exam, combined with testing for human papilloma virus (HPV), every five years. Some types of HPV increase your risk of cervical cancer. Testing for HPV may also be done on women of any age who have unclear Pap test results.  Other health care providers may not recommend any screening for nonpregnant women who are considered low risk for pelvic cancer and have no symptoms. Ask your health care provider if a screening pelvic exam is right for you.  If you have had past treatment for cervical cancer or a condition that could lead to cancer, you need Pap tests and screening for cancer for at least 20 years after your treatment. If Pap tests have been discontinued for you, your risk factors (such as having a new sexual  partner) need to be reassessed to determine if you should start having screenings again. Some women have medical problems that increase the chance of getting cervical cancer. In these cases, your health care provider may recommend that you have screening and Pap tests more often.  If you have a family history of uterine cancer or ovarian cancer, talk with your health care provider about genetic screening.  If you have vaginal bleeding after reaching menopause, tell your health care provider.  There are currently no reliable tests available to screen for ovarian cancer.  Lung Cancer Lung cancer screening is recommended for adults 55-80 years old who are at high risk for lung cancer because of a history of smoking. A yearly low-dose CT scan of the lungs is recommended if you:  Currently smoke.  Have a history of at least 30 pack-years of smoking and you currently smoke or have quit within the past 15 years. A pack-year is smoking an average of one pack of cigarettes per day for one year.  Yearly screening should:  Continue until it has been 15 years since you quit.  Stop if you develop a health problem that would prevent you from having lung cancer treatment.  Colorectal Cancer  This type of cancer can be detected and can often be prevented.  Routine colorectal cancer   screening usually begins at age 50 and continues through age 75.  If you have risk factors for colon cancer, your health care provider may recommend that you be screened at an earlier age.  If you have a family history of colorectal cancer, talk with your health care provider about genetic screening.  Your health care provider may also recommend using home test kits to check for hidden blood in your stool.  A small camera at the end of a tube can be used to examine your colon directly (sigmoidoscopy or colonoscopy). This is done to check for the earliest forms of colorectal cancer.  Direct examination of the colon  should be repeated every 5-10 years until age 75. However, if early forms of precancerous polyps or small growths are found or if you have a family history or genetic risk for colorectal cancer, you may need to be screened more often.  Skin Cancer  Check your skin from head to toe regularly.  Monitor any moles. Be sure to tell your health care provider: ? About any new moles or changes in moles, especially if there is a change in a mole's shape or color. ? If you have a mole that is larger than the size of a pencil eraser.  If any of your family members has a history of skin cancer, especially at a young age, talk with your health care provider about genetic screening.  Always use sunscreen. Apply sunscreen liberally and repeatedly throughout the day.  Whenever you are outside, protect yourself by wearing long sleeves, pants, a wide-brimmed hat, and sunglasses.  What should I know about osteoporosis? Osteoporosis is a condition in which bone destruction happens more quickly than new bone creation. After menopause, you may be at an increased risk for osteoporosis. To help prevent osteoporosis or the bone fractures that can happen because of osteoporosis, the following is recommended:  If you are 19-50 years old, get at least 1,000 mg of calcium and at least 600 mg of vitamin D per day.  If you are older than age 50 but younger than age 70, get at least 1,200 mg of calcium and at least 600 mg of vitamin D per day.  If you are older than age 70, get at least 1,200 mg of calcium and at least 800 mg of vitamin D per day.  Smoking and excessive alcohol intake increase the risk of osteoporosis. Eat foods that are rich in calcium and vitamin D, and do weight-bearing exercises several times each week as directed by your health care provider. What should I know about how menopause affects my mental health? Depression may occur at any age, but it is more common as you become older. Common symptoms of  depression include:  Low or sad mood.  Changes in sleep patterns.  Changes in appetite or eating patterns.  Feeling an overall lack of motivation or enjoyment of activities that you previously enjoyed.  Frequent crying spells.  Talk with your health care provider if you think that you are experiencing depression. What should I know about immunizations? It is important that you get and maintain your immunizations. These include:  Tetanus, diphtheria, and pertussis (Tdap) booster vaccine.  Influenza every year before the flu season begins.  Pneumonia vaccine.  Shingles vaccine.  Your health care provider may also recommend other immunizations. This information is not intended to replace advice given to you by your health care provider. Make sure you discuss any questions you have with your health care   provider. Document Released: 12/24/2005 Document Revised: 05/21/2016 Document Reviewed: 08/05/2015 Elsevier Interactive Patient Education  2018 [employer].

## 2017-12-12 ENCOUNTER — Other Ambulatory Visit: Payer: Self-pay | Admitting: Internal Medicine

## 2018-01-11 ENCOUNTER — Encounter: Payer: Self-pay | Admitting: Internal Medicine

## 2018-01-11 ENCOUNTER — Ambulatory Visit: Payer: Medicare Other | Admitting: Internal Medicine

## 2018-01-11 ENCOUNTER — Other Ambulatory Visit (INDEPENDENT_AMBULATORY_CARE_PROVIDER_SITE_OTHER): Payer: Medicare Other

## 2018-01-11 DIAGNOSIS — I10 Essential (primary) hypertension: Secondary | ICD-10-CM | POA: Diagnosis not present

## 2018-01-11 DIAGNOSIS — Z0000 Encounter for general adult medical examination without abnormal findings: Secondary | ICD-10-CM

## 2018-01-11 DIAGNOSIS — K219 Gastro-esophageal reflux disease without esophagitis: Secondary | ICD-10-CM | POA: Diagnosis not present

## 2018-01-11 DIAGNOSIS — E1140 Type 2 diabetes mellitus with diabetic neuropathy, unspecified: Secondary | ICD-10-CM | POA: Diagnosis not present

## 2018-01-11 DIAGNOSIS — E785 Hyperlipidemia, unspecified: Secondary | ICD-10-CM

## 2018-01-11 LAB — URINALYSIS, ROUTINE W REFLEX MICROSCOPIC
Bilirubin Urine: NEGATIVE
Hgb urine dipstick: NEGATIVE
Ketones, ur: NEGATIVE
NITRITE: NEGATIVE
SPECIFIC GRAVITY, URINE: 1.015 (ref 1.000–1.030)
TOTAL PROTEIN, URINE-UPE24: NEGATIVE
URINE GLUCOSE: NEGATIVE
UROBILINOGEN UA: 0.2 (ref 0.0–1.0)
pH: 6.5 (ref 5.0–8.0)

## 2018-01-11 LAB — CBC WITH DIFFERENTIAL/PLATELET
BASOS ABS: 0 10*3/uL (ref 0.0–0.1)
Basophils Relative: 0.5 % (ref 0.0–3.0)
EOS ABS: 0.3 10*3/uL (ref 0.0–0.7)
Eosinophils Relative: 3 % (ref 0.0–5.0)
HEMATOCRIT: 35.6 % — AB (ref 36.0–46.0)
HEMOGLOBIN: 11.8 g/dL — AB (ref 12.0–15.0)
LYMPHS PCT: 24.4 % (ref 12.0–46.0)
Lymphs Abs: 2.5 10*3/uL (ref 0.7–4.0)
MCHC: 33 g/dL (ref 30.0–36.0)
MCV: 92.3 fl (ref 78.0–100.0)
Monocytes Absolute: 0.6 10*3/uL (ref 0.1–1.0)
Monocytes Relative: 5.5 % (ref 3.0–12.0)
Neutro Abs: 6.8 10*3/uL (ref 1.4–7.7)
Neutrophils Relative %: 66.6 % (ref 43.0–77.0)
Platelets: 300 10*3/uL (ref 150.0–400.0)
RBC: 3.85 Mil/uL — AB (ref 3.87–5.11)
RDW: 12.8 % (ref 11.5–15.5)
WBC: 10.2 10*3/uL (ref 4.0–10.5)

## 2018-01-11 LAB — BASIC METABOLIC PANEL
BUN: 13 mg/dL (ref 6–23)
CALCIUM: 9.8 mg/dL (ref 8.4–10.5)
CHLORIDE: 104 meq/L (ref 96–112)
CO2: 30 meq/L (ref 19–32)
Creatinine, Ser: 0.64 mg/dL (ref 0.40–1.20)
GFR: 93.78 mL/min (ref >60.00)
Glucose, Bld: 94 mg/dL (ref 70–99)
POTASSIUM: 4.4 meq/L (ref 3.5–5.1)
SODIUM: 140 meq/L (ref 135–145)

## 2018-01-11 LAB — HEPATIC FUNCTION PANEL
ALT: 8 U/L (ref 0–35)
AST: 16 U/L (ref 0–37)
Albumin: 3.9 g/dL (ref 3.5–5.2)
Alkaline Phosphatase: 49 U/L (ref 39–117)
Bilirubin, Direct: 0.1 mg/dL (ref 0.0–0.3)
TOTAL PROTEIN: 7.1 g/dL (ref 6.0–8.3)
Total Bilirubin: 0.3 mg/dL (ref 0.2–1.2)

## 2018-01-11 LAB — LIPID PANEL
CHOL/HDL RATIO: 3
CHOLESTEROL: 145 mg/dL (ref 0–200)
HDL: 42.9 mg/dL (ref >39.00)
LDL Cholesterol: 73 mg/dL (ref 0–99)
NonHDL: 101.98
TRIGLYCERIDES: 144 mg/dL (ref 0.0–149.0)
VLDL: 28.8 mg/dL (ref 0.0–40.0)

## 2018-01-11 LAB — HEMOGLOBIN A1C: HEMOGLOBIN A1C: 6.1 % (ref 4.6–6.5)

## 2018-01-11 LAB — TSH: TSH: 0.75 u[IU]/mL (ref 0.35–4.50)

## 2018-01-11 MED ORDER — PANTOPRAZOLE SODIUM 40 MG PO TBEC: DELAYED_RELEASE_TABLET | ORAL | 3 refills | Status: DC

## 2018-01-11 NOTE — Progress Notes (Signed)
Subjective:  Patient ID: Toni Mcmillan, female    DOB: 02/13/1931  Age: 84 y.o. MRN: [id number]  CC: No chief complaint on file.   HPI Toni Mcmillan presents for worsening GERD sx's x2 wks - starts in am. C/o cough w/phlegm due to GERD- minor relief. Some dysphagia/pain is present. Remote EGD w/Dr Laural BenesJohnson Unable to sleep due to GERD  Outpatient Medications Prior to Visit  Medication Sig Dispense Refill  . aspirin EC 81 MG tablet Take 81 mg by mouth at bedtime.     Marland Kitchen. aspirin-acetaminophen-caffeine (EXCEDRIN MIGRAINE) 250-250-65 MG per tablet Take 1-2 tablets by mouth every 6 (six) hours as needed for headache.    . Calcium Carbonate-Vit D-Min (CALCIUM 1200 PO) Take 1 tablet by mouth every morning.     . Cholecalciferol 1000 UNITS tablet Take 1,000 Units by mouth every morning.     . gabapentin (NEURONTIN) 100 MG capsule Take 1 capsule (100 mg total) by mouth 2 (two) times daily as needed (neuropathy). 60 capsule 5  . metoprolol tartrate (LOPRESSOR) 50 MG tablet TAKE ONE AND ONE-HALF (1 & 1/2) TABLET BY MOUTH TWO TIMES A DAY 270 tablet 3  . Multiple Vitamins-Minerals (MULTIVITAMIN,TX-MINERALS) tablet Take 1 tablet by mouth every morning.     . Omega-3 Fatty Acids (FISH OIL PO) Take 1 tablet by mouth every morning.    . Polyvinyl Alcohol-Povidone (REFRESH OP) Apply 1-2 drops to eye daily as needed (dry eyes).    . ramipril (ALTACE) 2.5 MG capsule TAKE ONE CAPSULE BY MOUTH DAILY 90 capsule 0  . simvastatin (ZOCOR) 40 MG tablet TAKE ONE TABLET BY MOUTH EVERY NIGHT AT BEDTIME 90 tablet 0  . vitamin C (ASCORBIC ACID) 500 MG tablet Take 500 mg by mouth 2 (two) times daily.    . Wheat Dextrin (BENEFIBER PO) Take 10 mLs by mouth daily.    Marland Kitchen. zolpidem (AMBIEN) 10 MG tablet TAKE ONE TABLET BY MOUTH EVERY NIGHT AT BEDTIME  AS NEEDED FOR SLEEP 30 tablet 1  . cephALEXin (KEFLEX) 500 MG capsule Take 1 capsule (500 mg total) by mouth 4 (four) times daily. 28 capsule 0   No facility-administered  medications prior to visit.     ROS Review of Systems  Constitutional: Negative for activity change, appetite change, chills, fatigue and unexpected weight change.  HENT: Negative for congestion, mouth sores and sinus pressure.   Eyes: Negative for visual disturbance.  Respiratory: Negative for cough and chest tightness.   Gastrointestinal: Positive for abdominal pain. Negative for blood in stool and nausea.  Genitourinary: Negative for difficulty urinating, frequency and vaginal pain.  Musculoskeletal: Negative for back pain and gait problem.  Skin: Negative for pallor and rash.  Neurological: Negative for dizziness, tremors, weakness, numbness and headaches.  Psychiatric/Behavioral: Negative for confusion and sleep disturbance.    Objective:  BP 136/78 (BP Location: Left Arm, Patient Position: Sitting, Cuff Size: Normal)   Pulse (!) 58   Temp 97.9 F (36.6 C) (Oral)   Ht 5\' 2"  (1.575 m)   Wt 117 lb (53.1 kg)   SpO2 98%   BMI 21.40 kg/m   BP Readings from Last 3 Encounters:  01/11/18 136/78  11/28/17 132/82  08/19/17 (!) 144/86    Wt Readings from Last 3 Encounters:  01/11/18 117 lb (53.1 kg)  11/28/17 118 lb (53.5 kg)  08/16/17 121 lb (54.9 kg)    Physical Exam  Constitutional: She appears well-developed. No distress.  HENT:  Head: Normocephalic.  Right Ear:  External ear normal.  Left Ear: External ear normal.  Nose: Nose normal.  Mouth/Throat: Oropharynx is clear and moist.  Eyes: Conjunctivae are normal. Pupils are equal, round, and reactive to light. Right eye exhibits no discharge. Left eye exhibits no discharge.  Neck: Normal range of motion. Neck supple. No JVD present. No tracheal deviation present. No thyromegaly present.  Cardiovascular: Normal rate, regular rhythm and normal heart sounds.  Pulmonary/Chest: No stridor. No respiratory distress. She has no wheezes.  Abdominal: Soft. Bowel sounds are normal. She exhibits no distension and no mass. There is  no tenderness. There is no rebound and no guarding.  Musculoskeletal: She exhibits no edema or tenderness.  Lymphadenopathy:    She has no cervical adenopathy.  Neurological: She displays normal reflexes. No cranial nerve deficit. She exhibits normal muscle tone. Coordination normal.  Skin: No rash noted. No erythema.  Psychiatric: She has a normal mood and affect. Her behavior is normal. Judgment and thought content normal.    Lab Results  Component Value Date   WBC 8.8 12/21/2016   HGB 12.0 12/21/2016   HCT 36.0 12/21/2016   PLT 378.0 12/21/2016   GLUCOSE 109 (H) 08/16/2017   CHOL 150 11/24/2016   TRIG 111.0 11/24/2016   HDL 54.20 11/24/2016   LDLCALC 74 11/24/2016   ALT 11 11/24/2016   AST 19 11/24/2016   NA 139 08/16/2017   K 4.6 08/16/2017   CL 103 08/16/2017   CREATININE 0.64 08/16/2017   BUN 12 08/16/2017   CO2 31 08/16/2017   TSH 1.06 11/24/2016   HGBA1C 6.2 08/16/2017    No results found.  Assessment & Plan:   There are no diagnoses linked to this encounter. I have discontinued Toni Mcmillan's cephALEXin. I am also having her maintain her (multivitamin,tx-minerals), Calcium Carbonate-Vit D-Min (CALCIUM 1200 PO), aspirin EC, Cholecalciferol, Wheat Dextrin (BENEFIBER PO), Omega-3 Fatty Acids (FISH OIL PO), aspirin-acetaminophen-caffeine, Polyvinyl Alcohol-Povidone (REFRESH OP), vitamin C, gabapentin, metoprolol tartrate, ramipril, simvastatin, and zolpidem.  No orders of the defined types were placed in this encounter.    Follow-up: No Follow-up on file.  Sonda Primes, MD

## 2018-01-11 NOTE — Patient Instructions (Signed)
Gastroesophageal Reflux Disease, Adult Normally, food travels down the esophagus and stays in the stomach to be digested. However, when a person has gastroesophageal reflux disease (GERD), food and stomach acid move back up into the esophagus. When this happens, the esophagus becomes sore and inflamed. Over time, GERD can create small holes (ulcers) in the lining of the esophagus. What are the causes? This condition is caused by a problem with the muscle between the esophagus and the stomach (lower esophageal sphincter, or LES). Normally, the LES muscle closes after food passes through the esophagus to the stomach. When the LES is weakened or abnormal, it does not close properly, and that allows food and stomach acid to go back up into the esophagus. The LES can be weakened by certain dietary substances, medicines, and medical conditions, including:  Tobacco use.  Pregnancy.  Having a hiatal hernia.  Heavy alcohol use.  Certain foods and beverages, such as coffee, chocolate, onions, and peppermint.  What increases the risk? This condition is more likely to develop in:  People who have an increased body weight.  People who have connective tissue disorders.  People who use NSAID medicines.  What are the signs or symptoms? Symptoms of this condition include:  Heartburn.  Difficult or painful swallowing.  The feeling of having a lump in the throat.  Abitter taste in the mouth.  Bad breath.  Having a large amount of saliva.  Having an upset or bloated stomach.  Belching.  Chest pain.  Shortness of breath or wheezing.  Ongoing (chronic) cough or a night-time cough.  Wearing away of tooth enamel.  Weight loss.  Different conditions can cause chest pain. Make sure to see your health care provider if you experience chest pain. How is this diagnosed? Your health care provider will take a medical history and perform a physical exam. To determine if you have mild or severe  GERD, your health care provider may also monitor how you respond to treatment. You may also have other tests, including:  An endoscopy toexamine your stomach and esophagus with a small camera.  A test thatmeasures the acidity level in your esophagus.  A test thatmeasures how much pressure is on your esophagus.  A barium swallow or modified barium swallow to show the shape, size, and functioning of your esophagus.  How is this treated? The goal of treatment is to help relieve your symptoms and to prevent complications. Treatment for this condition may vary depending on how severe your symptoms are. Your health care provider may recommend:  Changes to your diet.  Medicine.  Surgery.  Follow these instructions at home: Diet  Follow a diet as recommended by your health care provider. This may involve avoiding foods and drinks such as: ? Coffee and tea (with or without caffeine). ? Drinks that containalcohol. ? Energy drinks and sports drinks. ? Carbonated drinks or sodas. ? Chocolate and cocoa. ? Peppermint and mint flavorings. ? Garlic and onions. ? Horseradish. ? Spicy and acidic foods, including peppers, chili powder, curry powder, vinegar, hot sauces, and barbecue sauce. ? Citrus fruit juices and citrus fruits, such as oranges, lemons, and limes. ? Tomato-based foods, such as red sauce, chili, salsa, and pizza with red sauce. ? Fried and fatty foods, such as donuts, french fries, potato chips, and high-fat dressings. ? High-fat meats, such as hot dogs and fatty cuts of red and white meats, such as rib eye steak, sausage, ham, and bacon. ? High-fat dairy items, such as whole milk,   butter, and cream cheese.  Eat small, frequent meals instead of large meals.  Avoid drinking large amounts of liquid with your meals.  Avoid eating meals during the 2-3 hours before bedtime.  Avoid lying down right after you eat.  Do not exercise right after you eat. General  instructions  Pay attention to any changes in your symptoms.  Take over-the-counter and prescription medicines only as told by your health care provider. Do not take aspirin, ibuprofen, or other NSAIDs unless your health care provider told you to do so.  Do not use any tobacco products, including cigarettes, chewing tobacco, and e-cigarettes. If you need help quitting, ask your health care provider.  Wear loose-fitting clothing. Do not wear anything tight around your waist that causes pressure on your abdomen.  Raise (elevate) the head of your bed 6 inches (15cm).  Try to reduce your stress, such as with yoga or meditation. If you need help reducing stress, ask your health care provider.  If you are overweight, reduce your weight to an amount that is healthy for you. Ask your health care provider for guidance about a safe weight loss goal.  Keep all follow-up visits as told by your health care provider. This is important. Contact a health care provider if:  You have new symptoms.  You have unexplained weight loss.  You have difficulty swallowing, or it hurts to swallow.  You have wheezing or a persistent cough.  Your symptoms do not improve with treatment.  You have a hoarse voice. Get help right away if:  You have pain in your arms, neck, jaw, teeth, or back.  You feel sweaty, dizzy, or light-headed.  You have chest pain or shortness of breath.  You vomit and your vomit looks like blood or coffee grounds.  You faint.  Your stool is bloody or black.  You cannot swallow, drink, or eat. This information is not intended to replace advice given to you by your health care provider. Make sure you discuss any questions you have with your health care provider. Document Released: 08/11/2005 Document Revised: 03/31/2016 Document Reviewed: 02/26/2015 Elsevier Interactive Patient Education  2018 [employer]  

## 2018-01-11 NOTE — Assessment & Plan Note (Signed)
BP Readings from Last 3 Encounters:  01/11/18 136/78  11/28/17 132/82  08/19/17 (!) 144/86  May consider switching from Ramipril

## 2018-01-11 NOTE — Assessment & Plan Note (Addendum)
Protonix Rx  worsening GERD sx's x2 wks - starts in am. C/o cough w/phlegm due to GERD- minor relief. Some dysphagia/pain is present. Remote EGD w/Dr [facility]  GI ref - Dr Ewing SchleinMagod

## 2018-01-28 ENCOUNTER — Other Ambulatory Visit: Payer: Self-pay | Admitting: Internal Medicine

## 2018-02-17 ENCOUNTER — Ambulatory Visit: Payer: Medicare Other | Admitting: Family

## 2018-02-17 ENCOUNTER — Encounter: Payer: Self-pay | Admitting: Family

## 2018-02-17 VITALS — BP 134/84 | HR 62 | Temp 98.1°F | Ht 62.0 in | Wt 119.0 lb

## 2018-02-17 DIAGNOSIS — B353 Tinea pedis: Secondary | ICD-10-CM

## 2018-02-17 MED ORDER — KETOCONAZOLE 2 % EX CREA: 1.0000 "application " | TOPICAL_CREAM | Freq: Two times a day (BID) | CUTANEOUS | 0 refills | Status: DC

## 2018-02-17 NOTE — Progress Notes (Signed)
Toni Mcmillan is a 84 y.o. female with the following history as recorded in [employer]:  Patient Active Problem List   Diagnosis Date Noted  . GERD (gastroesophageal reflux disease) 01/11/2018  . Toe pain 08/16/2017  . Foot pain, bilateral 01/10/2017  . Acute cystitis 11/27/2016  . Dyspnea on exertion 05/07/2016  . Palpitations 05/07/2016  . Dizziness 03/30/2016  . Vitreomacular traction syndrome of both eyes 03/25/2016  . Constipation 11/19/2015  . 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 Paresthesia of both feet 06/04/2015  . Abdominal bloating 04/23/2015  . Stool incontinence 04/23/2015  . Blood in stool 04/23/2015  . Well adult exam 07/11/2014  . Syncope and collapse 05/09/2014  . Dysautonomia orthostatic hypotension syndrome (HCC) 12/15/2013  . Allergic conjunctivitis 03/22/2013  . Vitreomacular adhesion of both eyes 03/22/2013  . Status post intraocular lens implant 06/22/2012  . Diabetic macular edema of left eye (HCC) 09/23/2011  . Nonproliferative diabetic retinopathy (HCC) 09/23/2011  . Routine general medical examination at a health care facility 05/11/2011  . Type 2 diabetes, controlled, with neuropathy (HCC) 12/16/2009  . Dyslipidemia 10/30/2007  . Essential hypertension 10/30/2007  . SINUSITIS, CHRONIC 10/30/2007  . ALLERGIC RHINITIS 10/30/2007  . Insomnia 10/30/2007  . ANOSMIA 10/30/2007  . DYSPEPSIA, HX OF 10/30/2007  . CATARACT EXTRACTIONS, BILATERAL, HX OF 10/30/2007    Current Outpatient Medications  Medication Sig Dispense Refill  . aspirin EC 81 MG tablet Take 81 mg by mouth at bedtime.     Marland Kitchen aspirin-acetaminophen-caffeine (EXCEDRIN MIGRAINE) 250-250-65 MG per tablet Take 1-2 tablets by mouth every 6 (six) hours as needed for headache.    . Calcium Carbonate-Vit D-Min (CALCIUM 1200 PO) Take 1 tablet by mouth every morning.     . Cholecalciferol 1000 UNITS tablet Take 1,000 Units by mouth every  morning.     . gabapentin (NEURONTIN) 100 MG capsule Take 1 capsule (100 mg total) by mouth 2 (two) times daily as needed (neuropathy). 60 capsule 5  . metoprolol tartrate (LOPRESSOR) 50 MG tablet TAKE ONE AND ONE-HALF (1 & 1/2) TABLET BY MOUTH TWO TIMES A DAY 270 tablet 3  . Multiple Vitamins-Minerals (MULTIVITAMIN,TX-MINERALS) tablet Take 1 tablet by mouth every morning.     . Omega-3 Fatty Acids (FISH OIL PO) Take 1 tablet by mouth every morning.    . pantoprazole (PROTONIX) 40 MG tablet 1 po bid x1 week, then qd in am 60 tablet 3  . Polyvinyl Alcohol-Povidone (REFRESH OP) Apply 1-2 drops to eye daily as needed (dry eyes).    . ramipril (ALTACE) 2.5 MG capsule TAKE ONE CAPSULE BY MOUTH DAILY 90 capsule 3  . simvastatin (ZOCOR) 40 MG tablet TAKE ONE TABLET BY MOUTH EVERY NIGHT AT BEDTIME 90 tablet 3  . vitamin C (ASCORBIC ACID) 500 MG tablet Take 500 mg by mouth 2 (two) times daily.    . Wheat Dextrin (BENEFIBER PO) Take 10 mLs by mouth daily.    Marland Kitchen zolpidem (AMBIEN) 10 MG tablet TAKE ONE TABLET BY MOUTH EVERY NIGHT AT BEDTIME  AS NEEDED FOR SLEEP 30 tablet 1  . ketoconazole (NIZORAL) 2 % cream Apply 1 application topically 2 (two) times daily. 15 g 0   No current facility-administered medications for this visit.     Allergies: Celecoxib; Clindamycin hcl; Erythromycin; Latex; Meperidine; Meperidine hcl; Morphine; Tetracycline; and Triprolidine-pseudoephedrine  Past Medical History:  Diagnosis Date  . Allergic rhinitis, cause unspecified   . Diabetes mellitus without complication (HCC)  borderline"never on meds"  . Disturbances of sensation of smell and taste   . Dizziness   . Headache(784.0)    has decreased to none in later years  . Insomnia, unspecified   . Other and unspecified hyperlipidemia   . Personal history of other diseases of digestive system   . Scarlet fever   . Unspecified essential hypertension    Metoprolol decreased due to drop in BP readings.  Marland Kitchen. Unspecified  sinusitis (chronic)   . Whooping cough, unspecified organism     Past Surgical History:  Procedure Laterality Date  . ABDOMINAL HYSTERECTOMY    . ANAL RECTAL MANOMETRY N/A 07/07/2015   Procedure: ANO RECTAL MANOMETRY;  Surgeon: Charolett BumpersMartin K Johnson, MD;  Location: [facility];  Service: Endoscopy;  Laterality: N/A;  . BILATERAL OOPHORECTOMY    . CATARACT EXTRACTION, BILATERAL    . COLONOSCOPY W/ POLYPECTOMY    . COLONOSCOPY WITH PROPOFOL N/A 05/29/2015   Procedure: COLONOSCOPY WITH PROPOFOL;  Surgeon: Charolett BumpersMartin K Johnson, MD;  Location: [facility];  Service: Endoscopy;  Laterality: N/A;  . correction of hammertoe deformity    . CYSTOSCOPY     w/ stenting of both ureters  . HEMORRHOID SURGERY    . hydrosalpinx diagnostic laparoscopy    . TONSILLECTOMY  age 84  . vericose vein surgery      Family History  Problem Relation Age of Onset  . Cancer Mother        ?  . Diabetes Mother   . Other Mother        coagulopathy/ ruptured viscus  . Hypertension Father   . Hyperlipidemia Father        ?  Marland Kitchen. Other Father        CVA  . Cancer Paternal Grandmother        breast    Social History   Tobacco Use  . Smoking status: Never Smoker  . Smokeless tobacco: Never Used  Substance Use Topics  . Alcohol use: No    Alcohol/week: 0.0 oz    Subjective:  Recurrent rash on right foot since last summer; was treated with course of Keflex last summer with no change; became concerned when she noticed a blister/ redness on top of her foot/ at base of toes in the past 24 hours;   Objective:  Vitals:   02/17/18 1130  BP: 134/84  Pulse: 62  Temp: 98.1 F (36.7 C)  TempSrc: Oral  SpO2: 97%  Weight: 119 lb (54 kg)  Height: 5\' 2"  (1.575 m)    General: Well developed, well nourished, in no acute distress  Skin : Warm and dry. Fungal infection noted between 4th and 5th toes Head: Normocephalic and atraumatic  Lungs: Respirations unlabored; clear to auscultation bilaterally without wheeze, rales,  rhonchi  Neurologic: Alert and oriented; speech intact; face symmetrical; moves all extremities well; CNII-XII intact without focal deficit   Assessment:  1. Tinea pedis of right foot     Plan:  Trial of Nizoral cream to affected toes; stressed need to keep toes dry; follow-up with response early next week;   No follow-ups on file.  No orders of the defined types were placed in this encounter.   Requested Prescriptions   Signed Prescriptions Disp Refills  . ketoconazole (NIZORAL) 2 % cream 15 g 0    Sig: Apply 1 application topically 2 (two) times daily.

## 2018-02-17 NOTE — Patient Instructions (Signed)
Athlete's Foot Athlete's foot (tinea pedis) is a fungal infection of the skin on the feet. It often occurs on the skin that is between or underneath the toes. It can also occur on the soles of the feet. The infection can spread from person to person (is contagious). Follow these instructions at home:  Apply or take over-the-counter and prescription medicines only as told by your doctor.  Keep all follow-up visits as told by your doctor. This is important.  Do not scratch your feet.  Keep your feet dry: ? Wear cotton or wool socks. Change your socks every day or if they become wet. ? Wear shoes that allow air to move around, such as sandals or canvas tennis shoes.  Wash and dry your feet: ? Every day or as told by your doctor. ? After exercising. ? Including the area between your toes.  Wear sandals in wet areas, such as locker rooms and shared showers.  Do not share any of these items: ? Towels. ? Nail clippers. ? Other personal items that touch your feet.  If you have diabetes, keep your blood sugar under control. Contact a doctor if:  You have a fever.  You have swelling, soreness, warmth, or redness in your foot.  You are not getting better with treatment.  Your symptoms get worse.  You have new symptoms. This information is not intended to replace advice given to you by your health care provider. Make sure you discuss any questions you have with your health care provider. Document Released: 04/19/2008 Document Revised: 04/08/2016 Document Reviewed: 05/05/2015 Elsevier Interactive Patient Education  2018 [employer]  

## 2018-02-24 ENCOUNTER — Encounter: Payer: Self-pay | Admitting: Family

## 2018-02-24 ENCOUNTER — Ambulatory Visit: Payer: Medicare Other | Admitting: Family

## 2018-02-24 VITALS — BP 132/82 | HR 61 | Temp 97.3°F | Ht 62.0 in | Wt 117.0 lb

## 2018-02-24 DIAGNOSIS — K219 Gastro-esophageal reflux disease without esophagitis: Secondary | ICD-10-CM

## 2018-02-24 NOTE — Progress Notes (Signed)
Toni Mcmillan is a 84 y.o. female with the following history as recorded in [employer]:  Patient Active Problem List   Diagnosis Date Noted  . GERD (gastroesophageal reflux disease) 01/11/2018  . Toe pain 08/16/2017  . Foot pain, bilateral 01/10/2017  . Acute cystitis 11/27/2016  . Dyspnea on exertion 05/07/2016  . Palpitations 05/07/2016  . Dizziness 03/30/2016  . Vitreomacular traction syndrome of both eyes 03/25/2016  . Constipation 11/19/2015  . 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 Paresthesia of both feet 06/04/2015  . Abdominal bloating 04/23/2015  . Stool incontinence 04/23/2015  . Blood in stool 04/23/2015  . Well adult exam 07/11/2014  . Syncope and collapse 05/09/2014  . Dysautonomia orthostatic hypotension syndrome (HCC) 12/15/2013  . Allergic conjunctivitis 03/22/2013  . Vitreomacular adhesion of both eyes 03/22/2013  . Status post intraocular lens implant 06/22/2012  . Diabetic macular edema of left eye (HCC) 09/23/2011  . Nonproliferative diabetic retinopathy (HCC) 09/23/2011  . Routine general medical examination at a health care facility 05/11/2011  . Type 2 diabetes, controlled, with neuropathy (HCC) 12/16/2009  . Dyslipidemia 10/30/2007  . Essential hypertension 10/30/2007  . SINUSITIS, CHRONIC 10/30/2007  . ALLERGIC RHINITIS 10/30/2007  . Insomnia 10/30/2007  . ANOSMIA 10/30/2007  . DYSPEPSIA, HX OF 10/30/2007  . CATARACT EXTRACTIONS, BILATERAL, HX OF 10/30/2007    Current Outpatient Medications  Medication Sig Dispense Refill  . aspirin EC 81 MG tablet Take 81 mg by mouth at bedtime.     Marland Kitchen aspirin-acetaminophen-caffeine (EXCEDRIN MIGRAINE) 250-250-65 MG per tablet Take 1-2 tablets by mouth every 6 (six) hours as needed for headache.    . Calcium Carbonate-Vit D-Min (CALCIUM 1200 PO) Take 1 tablet by mouth every morning.     . Cholecalciferol 1000 UNITS tablet Take 1,000 Units by mouth every  morning.     . gabapentin (NEURONTIN) 100 MG capsule Take 1 capsule (100 mg total) by mouth 2 (two) times daily as needed (neuropathy). 60 capsule 5  . ketoconazole (NIZORAL) 2 % cream Apply 1 application topically 2 (two) times daily. 15 g 0  . metoprolol tartrate (LOPRESSOR) 50 MG tablet TAKE ONE AND ONE-HALF (1 & 1/2) TABLET BY MOUTH TWO TIMES A DAY 270 tablet 3  . Multiple Vitamins-Minerals (MULTIVITAMIN,TX-MINERALS) tablet Take 1 tablet by mouth every morning.     . Omega-3 Fatty Acids (FISH OIL PO) Take 1 tablet by mouth every morning.    . pantoprazole (PROTONIX) 40 MG tablet 1 po bid x1 week, then qd in am 60 tablet 3  . Polyvinyl Alcohol-Povidone (REFRESH OP) Apply 1-2 drops to eye daily as needed (dry eyes).    . ramipril (ALTACE) 2.5 MG capsule TAKE ONE CAPSULE BY MOUTH DAILY 90 capsule 3  . simvastatin (ZOCOR) 40 MG tablet TAKE ONE TABLET BY MOUTH EVERY NIGHT AT BEDTIME 90 tablet 3  . vitamin C (ASCORBIC ACID) 500 MG tablet Take 500 mg by mouth 2 (two) times daily.    . Wheat Dextrin (BENEFIBER PO) Take 10 mLs by mouth daily.    Marland Kitchen zolpidem (AMBIEN) 10 MG tablet TAKE ONE TABLET BY MOUTH EVERY NIGHT AT BEDTIME  AS NEEDED FOR SLEEP 30 tablet 1   No current facility-administered medications for this visit.     Allergies: Celecoxib; Clindamycin hcl; Erythromycin; Latex; Meperidine; Meperidine hcl; Morphine; Tetracycline; and Triprolidine-pseudoephedrine  Past Medical History:  Diagnosis Date  . Allergic rhinitis, cause unspecified   . Diabetes mellitus without complication (HCC)  borderline"never on meds"  . Disturbances of sensation of smell and taste   . Dizziness   . Headache(784.0)    has decreased to none in later years  . Insomnia, unspecified   . Other and unspecified hyperlipidemia   . Personal history of other diseases of digestive system   . Scarlet fever   . Unspecified essential hypertension    Metoprolol decreased due to drop in BP readings.  Marland Kitchen. Unspecified  sinusitis (chronic)   . Whooping cough, unspecified organism     Past Surgical History:  Procedure Laterality Date  . ABDOMINAL HYSTERECTOMY    . ANAL RECTAL MANOMETRY N/A 07/07/2015   Procedure: ANO RECTAL MANOMETRY;  Surgeon: Charolett BumpersMartin K Johnson, MD;  Location: [facility];  Service: Endoscopy;  Laterality: N/A;  . BILATERAL OOPHORECTOMY    . CATARACT EXTRACTION, BILATERAL    . COLONOSCOPY W/ POLYPECTOMY    . COLONOSCOPY WITH PROPOFOL N/A 05/29/2015   Procedure: COLONOSCOPY WITH PROPOFOL;  Surgeon: Charolett BumpersMartin K Johnson, MD;  Location: [facility];  Service: Endoscopy;  Laterality: N/A;  . correction of hammertoe deformity    . CYSTOSCOPY     w/ stenting of both ureters  . HEMORRHOID SURGERY    . hydrosalpinx diagnostic laparoscopy    . TONSILLECTOMY  age 84  . vericose vein surgery      Family History  Problem Relation Age of Onset  . Cancer Mother        ?  . Diabetes Mother   . Other Mother        coagulopathy/ ruptured viscus  . Hypertension Father   . Hyperlipidemia Father        ?  Marland Kitchen. Other Father        CVA  . Cancer Paternal Grandmother        breast    Social History   Tobacco Use  . Smoking status: Never Smoker  . Smokeless tobacco: Never Used  Substance Use Topics  . Alcohol use: No    Alcohol/week: 0.0 oz    Subjective:  Patient presents with concerns for persisting "burning in her throat" when eating; talked to her PCP about these symptoms in February- referral was done to GI but patient was not contacted; notes she did better on the Protonix when it was twice a day- once medication decreased to once a day, symptoms worsened; Denies any blood in the stool or coffee grounds emesis; " white foamy liquid" when I throw up; has had problems with reflux in the past;  FH- father had GERD; daughter- ulcerative colitis;   Of note, she is very pleased with how her right foot is looking today compared to last week- responding well to topical fungal cream given;      Objective:  Vitals:   02/24/18 0839  BP: 132/82  Pulse: 61  Temp: (!) 97.3 F (36.3 C)  TempSrc: Oral  SpO2: 96%  Weight: 117 lb (53.1 kg)  Height: 5\' 2"  (1.575 m)    General: Well developed, well nourished, in no acute distress  Skin : Warm and dry.  Head: Normocephalic and atraumatic  Eyes: Sclera and conjunctiva clear; pupils round and reactive to light; extraocular movements intact  Ears: External normal; canals clear; tympanic membranes normal  Oropharynx: Pink, supple. No suspicious lesions  Neck: Supple without thyromegaly, adenopathy  Lungs: Respirations unlabored; clear to auscultation bilaterally without wheeze, rales, rhonchi  Abdomen: Soft; nontender; nondistended; normoactive bowel sounds; no masses or hepatosplenomegaly  Musculoskeletal: No deformities; no active  joint inflammation  Extremities: No edema, cyanosis, clubbing  Vessels: Symmetric bilaterally  Neurologic: Alert and oriented; speech intact; face symmetrical; moves all extremities well; CNII-XII intact without focal deficit  Assessment:  1. Gastroesophageal reflux disease, esophagitis presence not specified     Plan:  Increase Protonix to 40 mg bid; referral to GI updated again- she understands to call back if she has not been contacted to schedule within the next week;  Keep planned follow-up with her PCP otherwise.  No follow-ups on file.  Orders Placed This Encounter  Procedures  . Ambulatory referral to Gastroenterology    Referral Priority:   Routine    Referral Type:   Consultation    Referral Reason:   Specialty Services Required    Number of Visits Requested:   1    Requested Prescriptions    No prescriptions requested or ordered in this encounter

## 2018-02-24 NOTE — Patient Instructions (Addendum)
Increase your Protonix to twice a day until you see the GI in follow-up;     Gastroesophageal Reflux Disease, Adult Normally, food travels down the esophagus and stays in the stomach to be digested. If a person has gastroesophageal reflux disease (GERD), food and stomach acid move back up into the esophagus. When this happens, the esophagus becomes sore and swollen (inflamed). Over time, GERD can make small holes (ulcers) in the lining of the esophagus. Follow these instructions at home: Diet  Follow a diet as told by your doctor. You may need to avoid foods and drinks such as: ? Coffee and tea (with or without caffeine). ? Drinks that contain alcohol. ? Energy drinks and sports drinks. ? Carbonated drinks or sodas. ? Chocolate and cocoa. ? Peppermint and mint flavorings. ? Garlic and onions. ? Horseradish. ? Spicy and acidic foods, such as peppers, chili powder, curry powder, vinegar, hot sauces, and BBQ sauce. ? Citrus fruit juices and citrus fruits, such as oranges, lemons, and limes. ? Tomato-based foods, such as red sauce, chili, salsa, and pizza with red sauce. ? Fried and fatty foods, such as donuts, french fries, potato chips, and high-fat dressings. ? High-fat meats, such as hot dogs, rib eye steak, sausage, ham, and bacon. ? High-fat dairy items, such as whole milk, butter, and cream cheese.  Eat small meals often. Avoid eating large meals.  Avoid drinking large amounts of liquid with your meals.  Avoid eating meals during the 2-3 hours before bedtime.  Avoid lying down right after you eat.  Do not exercise right after you eat. General instructions  Pay attention to any changes in your symptoms.  Take over-the-counter and prescription medicines only as told by your doctor. Do not take aspirin, ibuprofen, or other NSAIDs unless your doctor says it is okay.  Do not use any tobacco products, including cigarettes, chewing tobacco, and e-cigarettes. If you need help  quitting, ask your doctor.  Wear loose clothes. Do not wear anything tight around your waist.  Raise (elevate) the head of your bed about 6 inches (15 cm).  Try to lower your stress. If you need help doing this, ask your doctor.  If you are overweight, lose an amount of weight that is healthy for you. Ask your doctor about a safe weight loss goal.  Keep all follow-up visits as told by your doctor. This is important. Contact a doctor if:  You have new symptoms.  You lose weight and you do not know why it is happening.  You have trouble swallowing, or it hurts to swallow.  You have wheezing or a cough that keeps happening.  Your symptoms do not get better with treatment.  You have a hoarse voice. Get help right away if:  You have pain in your arms, neck, jaw, teeth, or back.  You feel sweaty, dizzy, or light-headed.  You have chest pain or shortness of breath.  You throw up (vomit) and your throw up looks like blood or coffee grounds.  You pass out (faint).  Your poop (stool) is bloody or black.  You cannot swallow, drink, or eat. This information is not intended to replace advice given to you by your health care provider. Make sure you discuss any questions you have with your health care provider. Document Released: 04/19/2008 Document Revised: 04/08/2016 Document Reviewed: 02/26/2015 Elsevier Interactive Patient Education  [employer]2018 [employer]

## 2018-03-21 DIAGNOSIS — K210 Gastro-esophageal reflux disease with esophagitis: Secondary | ICD-10-CM | POA: Diagnosis not present

## 2018-05-24 ENCOUNTER — Other Ambulatory Visit: Payer: Self-pay | Admitting: Internal Medicine

## 2018-05-29 ENCOUNTER — Other Ambulatory Visit (INDEPENDENT_AMBULATORY_CARE_PROVIDER_SITE_OTHER): Payer: Medicare Other

## 2018-05-29 ENCOUNTER — Encounter: Payer: Self-pay | Admitting: Internal Medicine

## 2018-05-29 ENCOUNTER — Ambulatory Visit: Payer: Medicare Other | Admitting: Internal Medicine

## 2018-05-29 VITALS — BP 124/76 | HR 57 | Temp 98.2°F | Ht 62.0 in | Wt 114.0 lb

## 2018-05-29 DIAGNOSIS — F5101 Primary insomnia: Secondary | ICD-10-CM | POA: Diagnosis not present

## 2018-05-29 DIAGNOSIS — E785 Hyperlipidemia, unspecified: Secondary | ICD-10-CM

## 2018-05-29 DIAGNOSIS — R1031 Right lower quadrant pain: Secondary | ICD-10-CM

## 2018-05-29 DIAGNOSIS — E1140 Type 2 diabetes mellitus with diabetic neuropathy, unspecified: Secondary | ICD-10-CM

## 2018-05-29 DIAGNOSIS — I10 Essential (primary) hypertension: Secondary | ICD-10-CM

## 2018-05-29 LAB — CBC WITH DIFFERENTIAL/PLATELET
BASOS PCT: 0.7 % (ref 0.0–3.0)
Basophils Absolute: 0.1 10*3/uL (ref 0.0–0.1)
EOS PCT: 3.6 % (ref 0.0–5.0)
Eosinophils Absolute: 0.3 10*3/uL (ref 0.0–0.7)
HCT: 37.2 % (ref 36.0–46.0)
Hemoglobin: 12.3 g/dL (ref 12.0–15.0)
LYMPHS ABS: 2.4 10*3/uL (ref 0.7–4.0)
Lymphocytes Relative: 29.6 % (ref 12.0–46.0)
MCHC: 33.1 g/dL (ref 30.0–36.0)
MCV: 91.3 fl (ref 78.0–100.0)
MONO ABS: 0.5 10*3/uL (ref 0.1–1.0)
Monocytes Relative: 6.6 % (ref 3.0–12.0)
NEUTROS PCT: 59.5 % (ref 43.0–77.0)
Neutro Abs: 4.9 10*3/uL (ref 1.4–7.7)
Platelets: 320 10*3/uL (ref 150.0–400.0)
RBC: 4.08 Mil/uL (ref 3.87–5.11)
RDW: 12.9 % (ref 11.5–15.5)
WBC: 8.2 10*3/uL (ref 4.0–10.5)

## 2018-05-29 LAB — SEDIMENTATION RATE: SED RATE: 23 mm/h (ref 0–30)

## 2018-05-29 LAB — BASIC METABOLIC PANEL
BUN: 14 mg/dL (ref 6–23)
CALCIUM: 9.8 mg/dL (ref 8.4–10.5)
CO2: 31 mEq/L (ref 19–32)
CREATININE: 0.81 mg/dL (ref 0.40–1.20)
Chloride: 102 mEq/L (ref 96–112)
GFR: 71.39 mL/min (ref >60.00)
Glucose, Bld: 107 mg/dL — ABNORMAL HIGH (ref 70–99)
Potassium: 4.9 mEq/L (ref 3.5–5.1)
Sodium: 140 mEq/L (ref 135–145)

## 2018-05-29 LAB — HEMOGLOBIN A1C: HEMOGLOBIN A1C: 6.2 % (ref 4.6–6.5)

## 2018-05-29 NOTE — Assessment & Plan Note (Signed)
?  etiology CBC, ESR Pt declined abd CT

## 2018-05-29 NOTE — Assessment & Plan Note (Signed)
Zolpidem prn - unable to sleep w/o it  Potential benefits of a long term benzodiazepines  use as well as potential risks  and complications were explained to the patient and were aknowledged.

## 2018-05-29 NOTE — Assessment & Plan Note (Signed)
Simvastatin 

## 2018-05-29 NOTE — Progress Notes (Signed)
Subjective:  Patient ID: Toni Mcmillan, female    DOB: 12/27/1930  Age: 84 y.o. MRN: [id number]  CC: No chief complaint on file.   HPI Toni Mcmillan presents for HTN, GERD, insomnia f/u  C/o RLQ pain x 1 mo - ?better  Outpatient Medications Prior to Visit  Medication Sig Dispense Refill  . aspirin EC 81 MG tablet Take 81 mg by mouth at bedtime.     Marland Kitchen aspirin-acetaminophen-caffeine (EXCEDRIN MIGRAINE) 250-250-65 MG per tablet Take 1-2 tablets by mouth every 6 (six) hours as needed for headache.    . Calcium Carbonate-Vit D-Min (CALCIUM 1200 PO) Take 1 tablet by mouth every morning.     . Cholecalciferol 1000 UNITS tablet Take 1,000 Units by mouth every morning.     . gabapentin (NEURONTIN) 100 MG capsule Take 1 capsule (100 mg total) by mouth 2 (two) times daily as needed (neuropathy). 60 capsule 5  . ketoconazole (NIZORAL) 2 % cream Apply 1 application topically 2 (two) times daily. 15 g 0  . metoprolol tartrate (LOPRESSOR) 50 MG tablet TAKE ONE AND ONE-HALF (1 & 1/2) TABLET BY MOUTH TWO TIMES A DAY 270 tablet 3  . Multiple Vitamins-Minerals (MULTIVITAMIN,TX-MINERALS) tablet Take 1 tablet by mouth every morning.     . Omega-3 Fatty Acids (FISH OIL PO) Take 1 tablet by mouth every morning.    . pantoprazole (PROTONIX) 40 MG tablet 1 po bid x1 week, then qd in am 60 tablet 3  . Polyvinyl Alcohol-Povidone (REFRESH OP) Apply 1-2 drops to eye daily as needed (dry eyes).    . ramipril (ALTACE) 2.5 MG capsule TAKE ONE CAPSULE BY MOUTH DAILY 90 capsule 3  . simvastatin (ZOCOR) 40 MG tablet TAKE ONE TABLET BY MOUTH EVERY NIGHT AT BEDTIME 90 tablet 3  . vitamin C (ASCORBIC ACID) 500 MG tablet Take 500 mg by mouth 2 (two) times daily.    . Wheat Dextrin (BENEFIBER PO) Take 10 mLs by mouth daily.    Marland Kitchen zolpidem (AMBIEN) 10 MG tablet TAKE ONE TABLET BY MOUTH EVERY NIGHT AT BEDTIME AS NEEDED FOR SLEEP 30 tablet 2   No facility-administered medications prior to visit.     ROS: Review of  Systems  Constitutional: Negative for activity change, appetite change, chills, fatigue and unexpected weight change.  HENT: Negative for congestion, mouth sores and sinus pressure.   Eyes: Negative for visual disturbance.  Respiratory: Negative for cough and chest tightness.   Gastrointestinal: Positive for abdominal pain. Negative for nausea.  Genitourinary: Negative for difficulty urinating, frequency and vaginal pain.  Musculoskeletal: Negative for back pain and gait problem.  Skin: Negative for pallor and rash.  Neurological: Negative for dizziness, tremors, weakness, numbness and headaches.  Psychiatric/Behavioral: Positive for sleep disturbance. Negative for confusion and suicidal ideas.    Objective:  BP 124/76 (BP Location: Left Arm, Patient Position: Sitting, Cuff Size: Normal)   Pulse (!) 57   Temp 98.2 F (36.8 C) (Oral)   Ht 5\' 2"  (1.575 m)   Wt 114 lb (51.7 kg)   SpO2 98%   BMI 20.85 kg/m   BP Readings from Last 3 Encounters:  05/29/18 124/76  02/24/18 132/82  02/17/18 134/84    Wt Readings from Last 3 Encounters:  05/29/18 114 lb (51.7 kg)  02/24/18 117 lb (53.1 kg)  02/17/18 119 lb (54 kg)    Physical Exam  Constitutional: She appears well-developed. No distress.  HENT:  Head: Normocephalic.  Right Ear: External ear normal.  Left  Ear: External ear normal.  Nose: Nose normal.  Mouth/Throat: Oropharynx is clear and moist.  Eyes: Pupils are equal, round, and reactive to light. Conjunctivae are normal. Right eye exhibits no discharge. Left eye exhibits no discharge.  Neck: Normal range of motion. Neck supple. No JVD present. No tracheal deviation present. No thyromegaly present.  Cardiovascular: Normal rate, regular rhythm and normal heart sounds.  Pulmonary/Chest: No stridor. No respiratory distress. She has no wheezes.  Abdominal: Soft. Bowel sounds are normal. She exhibits no distension and no mass. There is tenderness. There is no rebound and no  guarding.  Musculoskeletal: She exhibits no edema or tenderness.  Lymphadenopathy:    She has no cervical adenopathy.  Neurological: She displays normal reflexes. No cranial nerve deficit. She exhibits normal muscle tone. Coordination normal.  Skin: No rash noted. No erythema.  Psychiatric: She has a normal mood and affect. Her behavior is normal. Judgment and thought content normal.  RLQ is sensitive Colon 2016  Lab Results  Component Value Date   WBC 10.2 01/11/2018   HGB 11.8 (L) 01/11/2018   HCT 35.6 (L) 01/11/2018   PLT 300.0 01/11/2018   GLUCOSE 94 01/11/2018   CHOL 145 01/11/2018   TRIG 144.0 01/11/2018   HDL 42.90 01/11/2018   LDLCALC 73 01/11/2018   ALT 8 01/11/2018   AST 16 01/11/2018   NA 140 01/11/2018   K 4.4 01/11/2018   CL 104 01/11/2018   CREATININE 0.64 01/11/2018   BUN 13 01/11/2018   CO2 30 01/11/2018   TSH 0.75 01/11/2018   HGBA1C 6.1 01/11/2018    No results found.  Assessment & Plan:   There are no diagnoses linked to this encounter.   No orders of the defined types were placed in this encounter.    Follow-up: No follow-ups on file.  Sonda PrimesAlex Roshard Rezabek, MD

## 2018-05-29 NOTE — Assessment & Plan Note (Signed)
Metoprolol, Ramipril 

## 2018-06-18 ENCOUNTER — Other Ambulatory Visit: Payer: Self-pay | Admitting: Internal Medicine

## 2018-07-22 DIAGNOSIS — L508 Other urticaria: Secondary | ICD-10-CM | POA: Diagnosis not present

## 2018-08-15 ENCOUNTER — Ambulatory Visit (INDEPENDENT_AMBULATORY_CARE_PROVIDER_SITE_OTHER)
Admission: RE | Admit: 2018-08-15 | Discharge: 2018-08-15 | Disposition: A | Discharge status: Home / Self Care | Payer: Medicare Other | Source: Ambulatory Visit | Attending: Family | Admitting: Family

## 2018-08-15 ENCOUNTER — Ambulatory Visit: Payer: Medicare Other | Admitting: Family

## 2018-08-15 ENCOUNTER — Other Ambulatory Visit (INDEPENDENT_AMBULATORY_CARE_PROVIDER_SITE_OTHER): Payer: Medicare Other

## 2018-08-15 ENCOUNTER — Encounter: Payer: Self-pay | Admitting: Family

## 2018-08-15 VITALS — BP 122/60 | HR 52 | Temp 97.5°F | Ht 62.0 in | Wt 115.0 lb

## 2018-08-15 DIAGNOSIS — R300 Dysuria: Secondary | ICD-10-CM

## 2018-08-15 DIAGNOSIS — M25522 Pain in left elbow: Secondary | ICD-10-CM

## 2018-08-15 DIAGNOSIS — S59902A Unspecified injury of left elbow, initial encounter: Secondary | ICD-10-CM | POA: Diagnosis not present

## 2018-08-15 DIAGNOSIS — M25552 Pain in left hip: Secondary | ICD-10-CM | POA: Diagnosis not present

## 2018-08-15 DIAGNOSIS — M25562 Pain in left knee: Secondary | ICD-10-CM | POA: Diagnosis not present

## 2018-08-15 DIAGNOSIS — S79912A Unspecified injury of left hip, initial encounter: Secondary | ICD-10-CM | POA: Diagnosis not present

## 2018-08-15 LAB — POC URINALSYSI DIPSTICK (AUTOMATED)
Bilirubin, UA: NEGATIVE
Blood, UA: 2
Glucose, UA: NEGATIVE
KETONES UA: NEGATIVE
NITRITE UA: NEGATIVE
PROTEIN UA: POSITIVE — AB
Spec Grav, UA: 1.015 (ref 1.010–1.025)
UROBILINOGEN UA: 0.2 U/dL
pH, UA: 8 (ref 5.0–8.0)

## 2018-08-15 LAB — URINALYSIS, ROUTINE W REFLEX MICROSCOPIC
BILIRUBIN URINE: NEGATIVE
Ketones, ur: NEGATIVE
NITRITE: NEGATIVE
Specific Gravity, Urine: 1.02 (ref 1.000–1.030)
TOTAL PROTEIN, URINE-UPE24: 30 — AB
URINE GLUCOSE: NEGATIVE
UROBILINOGEN UA: 0.2 (ref 0.0–1.0)
pH: 7.5 (ref 5.0–8.0)

## 2018-08-15 MED ORDER — METHOCARBAMOL 500 MG PO TABS: 500.0000 mg | ORAL_TABLET | Freq: Three times a day (TID) | ORAL | 0 refills | Status: DC | PRN

## 2018-08-15 MED ORDER — CEFUROXIME AXETIL 250 MG PO TABS: 250.0000 mg | ORAL_TABLET | Freq: Two times a day (BID) | ORAL | 0 refills | Status: AC

## 2018-08-15 NOTE — Progress Notes (Signed)
Toni Mcmillan is a 84 y.o. female with the following history as recorded in EpicCare:  Patient Active Problem List   Diagnosis Date Noted  . RLQ abdominal pain 05/29/2018  . GERD (gastroesophageal reflux disease) 01/11/2018  . Toe pain 08/16/2017  . Foot pain, bilateral 01/10/2017  . Acute cystitis 11/27/2016  . Dyspnea on exertion 05/07/2016  . Palpitations 05/07/2016  . Dizziness 03/30/2016  . Vitreomacular traction syndrome of both eyes 03/25/2016  . Constipation 11/19/2015  . 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 Paresthesia of both feet 06/04/2015  . Abdominal bloating 04/23/2015  . Stool incontinence 04/23/2015  . Blood in stool 04/23/2015  . Well adult exam 07/11/2014  . Syncope and collapse 05/09/2014  . Dysautonomia orthostatic hypotension syndrome (HCC) 12/15/2013  . Allergic conjunctivitis 03/22/2013  . Vitreomacular adhesion of both eyes 03/22/2013  . Status post intraocular lens implant 06/22/2012  . Diabetic macular edema of left eye (HCC) 09/23/2011  . Nonproliferative diabetic retinopathy (HCC) 09/23/2011  . Routine general medical examination at a health care facility 05/11/2011  . Type 2 diabetes, controlled, with neuropathy (HCC) 12/16/2009  . Dyslipidemia 10/30/2007  . Essential hypertension 10/30/2007  . SINUSITIS, CHRONIC 10/30/2007  . ALLERGIC RHINITIS 10/30/2007  . Insomnia 10/30/2007  . ANOSMIA 10/30/2007  . DYSPEPSIA, HX OF 10/30/2007  . CATARACT EXTRACTIONS, BILATERAL, HX OF 10/30/2007    Current Outpatient Medications  Medication Sig Dispense Refill  . aspirin EC 81 MG tablet Take 81 mg by mouth at bedtime.     Marland Kitchen aspirin-acetaminophen-caffeine (EXCEDRIN MIGRAINE) 250-250-65 MG per tablet Take 1-2 tablets by mouth every 6 (six) hours as needed for headache.    . Calcium Carbonate-Vit D-Min (CALCIUM 1200 PO) Take 1 tablet by mouth every morning.     . Cholecalciferol 1000 UNITS tablet  Take 1,000 Units by mouth every morning.     . gabapentin (NEURONTIN) 100 MG capsule Take 1 capsule (100 mg total) by mouth 2 (two) times daily as needed (neuropathy). 60 capsule 5  . ipratropium (ATROVENT) 0.03 % nasal spray ipratropium bromide 0.03 % nasal spray    . ketoconazole (NIZORAL) 2 % cream Apply 1 application topically 2 (two) times daily. 15 g 0  . metoprolol tartrate (LOPRESSOR) 50 MG tablet TAKE ONE AND ONE-HALF (1 & 1/2) TABLET BY MOUTH TWO TIMES A DAY 270 tablet 3  . Multiple Vitamins-Minerals (MULTIVITAMIN,TX-MINERALS) tablet Take 1 tablet by mouth every morning.     . Omega-3 Fatty Acids (FISH OIL PO) Take 1 tablet by mouth every morning.    . pantoprazole (PROTONIX) 40 MG tablet TAKE ONE TABLET BY MOUTH TWICE A DAY FOR 1 WEEK THEN TAKE ONE TABLET BY MOUTH DAILY IN THE MORNING. 180 tablet 2  . Polyvinyl Alcohol-Povidone (REFRESH OP) Apply 1-2 drops to eye daily as needed (dry eyes).    . ramipril (ALTACE) 2.5 MG capsule TAKE ONE CAPSULE BY MOUTH DAILY 90 capsule 3  . simvastatin (ZOCOR) 40 MG tablet TAKE ONE TABLET BY MOUTH EVERY NIGHT AT BEDTIME 90 tablet 3  . vitamin C (ASCORBIC ACID) 500 MG tablet Take 500 mg by mouth 2 (two) times daily.    . Wheat Dextrin (BENEFIBER PO) Take 10 mLs by mouth daily.    Marland Kitchen zolpidem (AMBIEN) 10 MG tablet TAKE ONE TABLET BY MOUTH EVERY NIGHT AT BEDTIME AS NEEDED FOR SLEEP 30 tablet 2  . cefUROXime (CEFTIN) 250 MG tablet Take 1 tablet (250 mg total) by mouth  2 (two) times daily with a meal for 5 days. 10 tablet 0  . methocarbamol (ROBAXIN) 500 MG tablet Take 1 tablet (500 mg total) by mouth every 8 (eight) hours as needed. 30 tablet 0   No current facility-administered medications for this visit.     Allergies: Celecoxib; Clindamycin hcl; Erythromycin; Latex; Meperidine; Meperidine hcl; Morphine; Tetracycline; and Triprolidine-pseudoephedrine  Past Medical History:  Diagnosis Date  . Allergic rhinitis, cause unspecified   . Diabetes mellitus  without complication (HCC)    borderline"never on meds"  . Disturbances of sensation of smell and taste   . Dizziness   . Headache(784.0)    has decreased to none in later years  . Insomnia, unspecified   . Other and unspecified hyperlipidemia   . Personal history of other diseases of digestive system   . Scarlet fever   . Unspecified essential hypertension    Metoprolol decreased due to drop in BP readings.  Marland Kitchen Unspecified sinusitis (chronic)   . Whooping cough, unspecified organism     Past Surgical History:  Procedure Laterality Date  . ABDOMINAL HYSTERECTOMY    . ANAL RECTAL MANOMETRY N/A 07/07/2015   Procedure: ANO RECTAL MANOMETRY;  Surgeon: Charolett Bumpers, MD;  Location: [facility];  Service: Endoscopy;  Laterality: N/A;  . BILATERAL OOPHORECTOMY    . CATARACT EXTRACTION, BILATERAL    . COLONOSCOPY W/ POLYPECTOMY    . COLONOSCOPY WITH PROPOFOL N/A 05/29/2015   Procedure: COLONOSCOPY WITH PROPOFOL;  Surgeon: Charolett Bumpers, MD;  Location: [facility];  Service: Endoscopy;  Laterality: N/A;  . correction of hammertoe deformity    . CYSTOSCOPY     w/ stenting of both ureters  . HEMORRHOID SURGERY    . hydrosalpinx diagnostic laparoscopy    . TONSILLECTOMY  age 55  . vericose vein surgery      Family History  Problem Relation Age of Onset  . Cancer Mother        ?  . Diabetes Mother   . Other Mother        coagulopathy/ ruptured viscus  . Hypertension Father   . Hyperlipidemia Father        ?  Marland Kitchen Other Father        CVA  . Cancer Paternal Grandmother        breast    Social History   Tobacco Use  . Smoking status: Never Smoker  . Smokeless tobacco: Never Used  Substance Use Topics  . Alcohol use: No    Alcohol/week: 0.0 standard drinks    Subjective:  Patient notes that she fell last night and landed on her left side; she notes that she tripped on her sock and fell; did not hit head/ did not lose consciousness; now having left sided left elbow/ hip  pain; accompanied by her husband; uses canes for stability; wants to make sure she did not fracture her hip; does not want pain medication- planning to use OTC Ibuprofen;   Also concerned that she might have UTI; complaining of pelvic pain/ pressure/ discomfort with urination x 2 weeks; no fever, no blood in urine;     Objective:  Vitals:   08/15/18 1123  BP: 122/60  Pulse: (!) 52  Temp: (!) 97.5 F (36.4 C)  TempSrc: Oral  SpO2: 96%  Weight: 115 lb (52.2 kg)  Height: 5\' 2"  (1.575 m)    General: Well developed, well nourished, in no acute distress  Skin : Warm and dry.  Head: Normocephalic and  atraumatic  Lungs: Respirations unlabored;  Musculoskeletal: No deformities; swelling noted over left elbow; tender to palpation over left hip;  Extremities: No edema, cyanosis, clubbing  Vessels: Symmetric bilaterally  Neurologic: Alert and oriented; speech intact; face symmetrical; uses 2 walking sticks for stability; CNII-XII intact without focal deficit  Assessment:  1. Left hip pain   2. Left elbow pain   3. Dysuria     Plan:  Will update X-rays to rule out fracture; trial of Robaxin 500 mg tid prn for muscle relaxer; okay to use OTC Ibuprofen; follow-up to be determined. Check U/A and urine culture; Rx for Ceftin 250 mg bid x 5 days;   No follow-ups on file.  Orders Placed This Encounter  Procedures  . Urine Culture    Standing Status:   Future    Standing Expiration Date:   08/15/2019  . DG Elbow Complete Left    Standing Status:   Future    Number of Occurrences:   1    Standing Expiration Date:   10/16/2019    Order Specific Question:   Reason for Exam (SYMPTOM  OR DIAGNOSIS REQUIRED)    Answer:   left elbow pain    Order Specific Question:   Preferred imaging location?    Answer:   [facility]    Order Specific Question:   Radiology Contrast Protocol - do NOT remove file path    Answer:   \\[email or web address]  . DG HIP UNILAT  WITH PELVIS 2-3 VIEWS LEFT    Standing Status:   Future    Number of Occurrences:   1    Standing Expiration Date:   10/16/2019    Order Specific Question:   Reason for Exam (SYMPTOM  OR DIAGNOSIS REQUIRED)    Answer:   left hip pain    Order Specific Question:   Preferred imaging location?    Answer:   [facility]    Order Specific Question:   Radiology Contrast Protocol - do NOT remove file path    Answer:   \\[email or web address]  . Urinalysis    Standing Status:   Future    Standing Expiration Date:   08/15/2019  . POCT Urinalysis Dipstick (Automated)    Requested Prescriptions   Signed Prescriptions Disp Refills  . cefUROXime (CEFTIN) 250 MG tablet 10 tablet 0    Sig: Take 1 tablet (250 mg total) by mouth 2 (two) times daily with a meal for 5 days.  . methocarbamol (ROBAXIN) 500 MG tablet 30 tablet 0    Sig: Take 1 tablet (500 mg total) by mouth every 8 (eight) hours as needed.

## 2018-08-15 NOTE — Progress Notes (Signed)
I called patient to let her know that X-rays were normal; left VM; asked her to call back with questions or concerns.

## 2018-08-16 LAB — URINE CULTURE
MICRO NUMBER: 91177760
SPECIMEN QUALITY:: ADEQUATE

## 2018-08-24 DIAGNOSIS — Z961 Presence of intraocular lens: Secondary | ICD-10-CM | POA: Diagnosis not present

## 2018-08-24 DIAGNOSIS — H04123 Dry eye syndrome of bilateral lacrimal glands: Secondary | ICD-10-CM | POA: Diagnosis not present

## 2018-08-24 DIAGNOSIS — E113213 Type 2 diabetes mellitus with mild nonproliferative diabetic retinopathy with macular edema, bilateral: Secondary | ICD-10-CM | POA: Diagnosis not present

## 2018-08-24 DIAGNOSIS — H43823 Vitreomacular adhesion, bilateral: Secondary | ICD-10-CM | POA: Diagnosis not present

## 2018-09-06 ENCOUNTER — Ambulatory Visit (INDEPENDENT_AMBULATORY_CARE_PROVIDER_SITE_OTHER): Payer: Medicare Other

## 2018-09-06 DIAGNOSIS — Z23 Encounter for immunization: Secondary | ICD-10-CM

## 2018-09-08 ENCOUNTER — Telehealth: Payer: Self-pay | Admitting: Internal Medicine

## 2018-09-08 NOTE — Telephone Encounter (Signed)
Copied from CRM [id number]. Topic: Quick Communication - See Telephone Encounter >> Sep 08, 2018  2:18 PM Arlyss Gandy, NT wrote: CRM for notification. See Telephone encounter for: 09/08/18. Amada Jupiter with [employer] calling to let the office know that the zolpidem (AMBIEN) 10 MG tablet has been approved for a year. Cb#: [phone] opt 5. Ref#: [id number]

## 2018-09-12 NOTE — Telephone Encounter (Signed)
noted 

## 2018-09-15 ENCOUNTER — Other Ambulatory Visit: Payer: Self-pay | Admitting: Internal Medicine

## 2018-10-23 ENCOUNTER — Ambulatory Visit: Payer: Self-pay | Admitting: *Deleted

## 2018-10-23 NOTE — Telephone Encounter (Signed)
Patient having back pain near the right shoulder blade since yesterday. Stood on her feet for several hours before the pain started. While reclining in a chair she does not feel the pain. As soon as she moves the pain is severe. Denies any recent illness/fever/CP/SOB/Bladder/Bowel problems. No recent injury. The pain is with movement and does not radiate. She reports early last week she experienced a radiated sensation on the right side of her head down to her neck at the hairline. That resolved several days before the back pain started. No other symptoms noted. She is using a back brace, taking ibuprofen Q6h and a heating pad tonight. Reviewed symptoms which would require immediate emergency evaluation. Stated she understood.   Reason for Disposition . [1] SEVERE back pain (e.g., excruciating, unable to do any normal activities) AND [2] not improved 2 hours after pain medicine  Answer Assessment - Initial Assessment Questions 1. ONSET: "When did the pain begin?"      yesterday 2. LOCATION: "Where does it hurt?" (upper, mid or lower back)     Upper left under the shoulder blade 3. SEVERITY: "How bad is the pain?"  (e.g., Scale 1-10; mild, moderate, or severe)   - MILD (1-3): doesn't interfere with normal activities    - MODERATE (4-7): interferes with normal activities or awakens from sleep    - SEVERE (8-10): excruciating pain, unable to do any normal activities      Severe with any movement.  4. PATTERN: "Is the pain constant?" (e.g., yes, no; constant, intermittent)      Constant unless lying back in the recliner very still. 5. RADIATION: "Does the pain shoot into your legs or elsewhere?"     no 6. CAUSE:  "What do you think is causing the back pain?"      unsure 7. BACK OVERUSE:  "Any recent lifting of heavy objects, strenuous work or exercise?"     no 8. MEDICATIONS: "What have you taken so far for the pain?" (e.g., nothing, acetaminophen, NSAIDS)     2 ibuprofen this morning around 9am.  2 hours later still painful to move. 9. NEUROLOGIC SYMPTOMS: "Do you have any weakness, numbness, or problems with bowel/bladder control?"     No. Nothing different than her usual. 10. OTHER SYMPTOMS: "Do you have any other symptoms?" (e.g., fever, abdominal pain, burning with urination, blood in urine)       None of these. She has noticed blood in her stools occasionally from hemorrhoids  but none today. 11. PREGNANCY: "Is there any chance you are pregnant?" (e.g., yes, no; LMP)       *No Answer*  Protocols used: BACK PAIN-A-AH

## 2018-10-24 ENCOUNTER — Encounter: Payer: Self-pay | Admitting: Family

## 2018-10-24 ENCOUNTER — Ambulatory Visit: Payer: Medicare Other | Admitting: Family

## 2018-10-24 VITALS — BP 128/76 | HR 59 | Temp 98.2°F | Ht 62.0 in | Wt 116.0 lb

## 2018-10-24 DIAGNOSIS — M542 Cervicalgia: Secondary | ICD-10-CM

## 2018-10-24 DIAGNOSIS — R079 Chest pain, unspecified: Secondary | ICD-10-CM

## 2018-10-24 MED ORDER — METHOCARBAMOL 500 MG PO TABS: 500.0000 mg | ORAL_TABLET | Freq: Three times a day (TID) | ORAL | 0 refills | Status: DC | PRN

## 2018-10-24 MED ORDER — METHYLPREDNISOLONE 4 MG PO TBPK: ORAL_TABLET | ORAL | 0 refills | Status: DC

## 2018-10-24 NOTE — Progress Notes (Signed)
Toni Mcmillan is a 84 y.o. female with the following history as recorded in EpicCare:  Patient Active Problem List   Diagnosis Date Noted  . RLQ abdominal pain 05/29/2018  . GERD (gastroesophageal reflux disease) 01/11/2018  . Toe pain 08/16/2017  . Foot pain, bilateral 01/10/2017  . Acute cystitis 11/27/2016  . Dyspnea on exertion 05/07/2016  . Palpitations 05/07/2016  . Dizziness 03/30/2016  . Vitreomacular traction syndrome of both eyes 03/25/2016  . Constipation 11/19/2015  . 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 Paresthesia of both feet 06/04/2015  . Abdominal bloating 04/23/2015  . Stool incontinence 04/23/2015  . Blood in stool 04/23/2015  . Well adult exam 07/11/2014  . Syncope and collapse 05/09/2014  . Dysautonomia orthostatic hypotension syndrome (HCC) 12/15/2013  . Allergic conjunctivitis 03/22/2013  . Vitreomacular adhesion of both eyes 03/22/2013  . Status post intraocular lens implant 06/22/2012  . Diabetic macular edema of left eye (HCC) 09/23/2011  . Nonproliferative diabetic retinopathy (HCC) 09/23/2011  . Routine general medical examination at a health care facility 05/11/2011  . Type 2 diabetes, controlled, with neuropathy (HCC) 12/16/2009  . Dyslipidemia 10/30/2007  . Essential hypertension 10/30/2007  . SINUSITIS, CHRONIC 10/30/2007  . ALLERGIC RHINITIS 10/30/2007  . Insomnia 10/30/2007  . ANOSMIA 10/30/2007  . DYSPEPSIA, HX OF 10/30/2007  . CATARACT EXTRACTIONS, BILATERAL, HX OF 10/30/2007    Current Outpatient Medications  Medication Sig Dispense Refill  . aspirin EC 81 MG tablet Take 81 mg by mouth at bedtime.     Marland Kitchen aspirin-acetaminophen-caffeine (EXCEDRIN MIGRAINE) 250-250-65 MG per tablet Take 1-2 tablets by mouth every 6 (six) hours as needed for headache.    . Calcium Carbonate-Vit D-Min (CALCIUM 1200 PO) Take 1 tablet by mouth every morning.     . Cholecalciferol 1000 UNITS tablet  Take 1,000 Units by mouth every morning.     . gabapentin (NEURONTIN) 100 MG capsule Take 1 capsule (100 mg total) by mouth 2 (two) times daily as needed (neuropathy). 60 capsule 5  . ipratropium (ATROVENT) 0.03 % nasal spray ipratropium bromide 0.03 % nasal spray    . ketoconazole (NIZORAL) 2 % cream Apply 1 application topically 2 (two) times daily. 15 g 0  . methocarbamol (ROBAXIN) 500 MG tablet Take 1 tablet (500 mg total) by mouth every 8 (eight) hours as needed. 20 tablet 0  . metoprolol tartrate (LOPRESSOR) 50 MG tablet TAKE ONE AND ONE-HALF (1 & 1/2) TABLET BY MOUTH TWO TIMES A DAY 270 tablet 3  . Multiple Vitamins-Minerals (MULTIVITAMIN,TX-MINERALS) tablet Take 1 tablet by mouth every morning.     . Omega-3 Fatty Acids (FISH OIL PO) Take 1 tablet by mouth every morning.    . pantoprazole (PROTONIX) 40 MG tablet TAKE ONE TABLET BY MOUTH TWICE A DAY FOR 1 WEEK THEN TAKE ONE TABLET BY MOUTH DAILY IN THE MORNING. 180 tablet 2  . Polyvinyl Alcohol-Povidone (REFRESH OP) Apply 1-2 drops to eye daily as needed (dry eyes).    . ramipril (ALTACE) 2.5 MG capsule TAKE ONE CAPSULE BY MOUTH DAILY 90 capsule 3  . simvastatin (ZOCOR) 40 MG tablet TAKE ONE TABLET BY MOUTH EVERY NIGHT AT BEDTIME 90 tablet 3  . vitamin C (ASCORBIC ACID) 500 MG tablet Take 500 mg by mouth 2 (two) times daily.    . Wheat Dextrin (BENEFIBER PO) Take 10 mLs by mouth daily.    Marland Kitchen zolpidem (AMBIEN) 10 MG tablet TAKE ONE TABLET BY MOUTH EVERY NIGHT  AT BEDTIME AS NEEDED FOR SLEEP 30 tablet 2  . methylPREDNISolone (MEDROL DOSEPAK) 4 MG TBPK tablet Taper as directed 21 tablet 0   No current facility-administered medications for this visit.     Allergies: Celecoxib; Clindamycin hcl; Erythromycin; Latex; Meperidine; Meperidine hcl; Morphine; Tetracycline; and Triprolidine-pseudoephedrine  Past Medical History:  Diagnosis Date  . Allergic rhinitis, cause unspecified   . Diabetes mellitus without complication (HCC)     borderline"never on meds"  . Disturbances of sensation of smell and taste   . Dizziness   . Headache(784.0)    has decreased to none in later years  . Insomnia, unspecified   . Other and unspecified hyperlipidemia   . Personal history of other diseases of digestive system   . Scarlet fever   . Unspecified essential hypertension    Metoprolol decreased due to drop in BP readings.  Marland Kitchen. Unspecified sinusitis (chronic)   . Whooping cough, unspecified organism     Past Surgical History:  Procedure Laterality Date  . ABDOMINAL HYSTERECTOMY    . ANAL RECTAL MANOMETRY N/A 07/07/2015   Procedure: ANO RECTAL MANOMETRY;  Surgeon: Charolett BumpersMartin K Johnson, MD;  Location: [facility];  Service: Endoscopy;  Laterality: N/A;  . BILATERAL OOPHORECTOMY    . CATARACT EXTRACTION, BILATERAL    . COLONOSCOPY W/ POLYPECTOMY    . COLONOSCOPY WITH PROPOFOL N/A 05/29/2015   Procedure: COLONOSCOPY WITH PROPOFOL;  Surgeon: Charolett BumpersMartin K Johnson, MD;  Location: [facility];  Service: Endoscopy;  Laterality: N/A;  . correction of hammertoe deformity    . CYSTOSCOPY     w/ stenting of both ureters  . HEMORRHOID SURGERY    . hydrosalpinx diagnostic laparoscopy    . TONSILLECTOMY  age 84  . vericose vein surgery      Family History  Problem Relation Age of Onset  . Cancer Mother        ?  . Diabetes Mother   . Other Mother        coagulopathy/ ruptured viscus  . Hypertension Father   . Hyperlipidemia Father        ?  Marland Kitchen. Other Father        CVA  . Cancer Paternal Grandmother        breast    Social History   Tobacco Use  . Smoking status: Never Smoker  . Smokeless tobacco: Never Used  Substance Use Topics  . Alcohol use: No    Alcohol/week: 0.0 standard drinks    Subjective:  Patient is accompanied by her husband; notes that for the past week has been experiencing "tingling sensation" in her neck/ top of scalp; symptoms noticeable with movement; no numbness or tingling radiating into upper extremities; no  rash, no shortness of breath; became concerned when developed sharp shooting pain in left mid back yesterday; had been bent over counter cutting up a ham prior to onset of symptoms; no recent fall or trauma; symptoms most definitely noticeable with movement;    Objective:  Vitals:   10/24/18 1015  BP: 128/76  Pulse: (!) 59  Temp: 98.2 F (36.8 C)  TempSrc: Oral  SpO2: 98%  Weight: 116 lb (52.6 kg)  Height: 5\' 2"  (1.575 m)    General: Well developed, well nourished, in no acute distress  Skin : Warm and dry.  Head: Normocephalic and atraumatic  Eyes: Sclera and conjunctiva clear; pupils round and reactive to light; extraocular movements intact  Ears: External normal; canals clear; tympanic membranes normal  Oropharynx: Pink, supple. No  suspicious lesions  Neck: Supple without thyromegaly, adenopathy  Lungs: Respirations unlabored; clear to auscultation bilaterally without wheeze, rales, rhonchi  CVS exam: normal rate and regular rhythm.  Musculoskeletal: No deformities; no active joint inflammation  Extremities: No edema, cyanosis, clubbing  Vessels: Symmetric bilaterally  Neurologic: Alert and oriented; speech intact; face symmetrical; moves all extremities well; CNII-XII intact without focal deficit   Assessment:  1. Chest pain, unspecified type   2. Neck pain     Plan:  In office, symptoms are noticed when patient changes position; otherwise, physical exam is reassuring; updated EKG- sinus bradycardia; discussed updating CXR and neck X-ray today but will treat for symptoms and plan to update X-ray if symptoms persist; Trial of Medrol Dose Pak and Robaxin; strict ER precautions discussed with patient and her husband. Will call and check on patient tomorrow.  No follow-ups on file.  Orders Placed This Encounter  Procedures  . DG Chest 2 View    Standing Status:   Future    Standing Expiration Date:   12/26/2019    Order Specific Question:   Reason for Exam (SYMPTOM  OR  DIAGNOSIS REQUIRED)    Answer:   atypical chest pain    Order Specific Question:   Preferred imaging location?    Answer:   [facility]    Order Specific Question:   Radiology Contrast Protocol - do NOT remove file path    Answer:   \\[email or web address]  . DG Cervical Spine 2 or 3 views    Standing Status:   Future    Standing Expiration Date:   12/26/2019    Order Specific Question:   Reason for Exam (SYMPTOM  OR DIAGNOSIS REQUIRED)    Answer:   neck pain    Order Specific Question:   Preferred imaging location?    Answer:   [facility]    Order Specific Question:   Radiology Contrast Protocol - do NOT remove file path    Answer:   \\[email or web address]  . EKG 12-Lead    Requested Prescriptions   Signed Prescriptions Disp Refills  . methylPREDNISolone (MEDROL DOSEPAK) 4 MG TBPK tablet 21 tablet 0    Sig: Taper as directed  . methocarbamol (ROBAXIN) 500 MG tablet 20 tablet 0    Sig: Take 1 tablet (500 mg total) by mouth every 8 (eight) hours as needed.

## 2018-10-25 ENCOUNTER — Ambulatory Visit: Payer: Self-pay

## 2018-10-25 NOTE — Telephone Encounter (Signed)
I called the patient's daughter back and advised that she is not on the DPR, so I could not give out information, but will be able to listen to what happened with her mother. She says that her mother was real constipated to the point that she says it felt like a hard ball was sitting on her rectum and it would not come out. She says she took Miralax and took a fleets enema, but no relief. She says the patient walked around some and as they were getting ready to leave to go to the ED, the patient says she had to use the bathroom. She went to the bathroom and passed a little stool, then a little while later passed some more stool. She says there was blood noticed. She said for me to call the patient to talk to her and get more details on how she's feeling. I called the patient and she says everything above that her daughter said. I triaged her constipation. She says "I don't go every day, maybe 4-5 times a week. I normally eat vegetables and fruit every day, but did not eat them for the past 2 days. I have hemorrhoids hanging out and I noticed some blood when I finally went to the bathroom. It was on the tissue. I didn't look in the toilet." I asked about any new medications, she says "I was started on Prednisone and Methocarbamol yesterday for my back spasms. My daughter bought some Miralax and gave me a fleets enema. Finally, they worked. My stomach was hurting and my rectum was hurting until the bowels moved. Now, I'm fine. I didn't go as much as I should have, but it was enough to relieve my pain." According to call PCP when office is open, care advice given, patient verbalized understanding. I advised patient I will send this to Dr. Posey Rea for review and if he has recommendations, someone from the office will call tomorrow.    Reason for Disposition . Taking new prescription medication  Answer Assessment - Initial Assessment Questions 1. STOOL PATTERN OR FREQUENCY: "How often do you pass bowel movements  (BMs)?"  (Normal range: tid to q 3 days)  "When was the last BM passed?"       Not regular, but 4-5 days out of 7 days; last BM today 2. STRAINING: "Do you have to strain to have a BM?"      Not always, but occassionally 3. RECTAL PAIN: "Does your rectum hurt when the stool comes out?" If so, ask: "Do you have hemorrhoids? How bad is the pain?"  (Scale 1-10; or mild, moderate, severe)     Occasionally it hurts; yes to hemorrhoids; pretty bad when it was coming out 4. STOOL COMPOSITION: "Are the stools hard?"      Every once in a while hard 5. BLOOD ON STOOLS: "Has there been any blood on the toilet tissue or on the surface of the BM?" If so, ask: "When was the last time?"      Occasionally dark stool; bright red today after BM 6. CHRONIC CONSTIPATION: "Is this a new problem for you?"  If no, ask: "How long have you had this problem?" (days, weeks, months)      I used to, but I outgrew that 7. CHANGES IN DIET: "Have there been any recent changes in your diet?"      No 8. MEDICATIONS: "Have you been taking any new medications?"     Prednisone and a muscle relaxer methocarbamol 9. LAXATIVES: "  Have you been using any laxatives or enemas?"  If yes, ask "What, how often, and when was the last time?"     OTC Miralax today and fleets enema today 10. CAUSE: "What do you think is causing the constipation?"        I don't know unless it's the medication 11. OTHER SYMPTOMS: "Do you have any other symptoms?" (e.g., abdominal pain, fever, vomiting)       Had abdominal pain after the enema and chills; now no pain 12. PREGNANCY: "Is there any chance you are pregnant?" "When was your last menstrual period?"       No  Protocols used: CONSTIPATION-A-AH

## 2018-10-26 NOTE — Telephone Encounter (Signed)
pls sch OV w/any provider Thx 

## 2018-10-26 NOTE — Telephone Encounter (Signed)
FYI

## 2018-10-27 NOTE — Telephone Encounter (Signed)
Called patient and informed. She said that she is not having the stomach cramps like she was before and has been doing what the nurse advised yesterday. She said that she will wait to see how it goes and will call back to schedule if needed.

## 2018-10-27 NOTE — Telephone Encounter (Signed)
noted 

## 2018-10-27 NOTE — Telephone Encounter (Signed)
Please call pt to schedule appt

## 2018-11-02 ENCOUNTER — Other Ambulatory Visit: Payer: Self-pay | Admitting: Internal Medicine

## 2018-11-13 DIAGNOSIS — Z01419 Encounter for gynecological examination (general) (routine) without abnormal findings: Secondary | ICD-10-CM | POA: Diagnosis not present

## 2018-11-13 DIAGNOSIS — Z6823 Body mass index (BMI) 23.0-23.9, adult: Secondary | ICD-10-CM | POA: Diagnosis not present

## 2018-11-13 DIAGNOSIS — Z1231 Encounter for screening mammogram for malignant neoplasm of breast: Secondary | ICD-10-CM | POA: Diagnosis not present

## 2018-11-28 ENCOUNTER — Other Ambulatory Visit (INDEPENDENT_AMBULATORY_CARE_PROVIDER_SITE_OTHER): Payer: Medicare Other

## 2018-11-28 ENCOUNTER — Encounter: Payer: Self-pay | Admitting: Internal Medicine

## 2018-11-28 ENCOUNTER — Ambulatory Visit: Payer: Medicare Other | Admitting: Internal Medicine

## 2018-11-28 VITALS — BP 122/74 | HR 70 | Temp 98.0°F | Ht 62.0 in | Wt 117.0 lb

## 2018-11-28 DIAGNOSIS — Z0000 Encounter for general adult medical examination without abnormal findings: Secondary | ICD-10-CM | POA: Diagnosis not present

## 2018-11-28 DIAGNOSIS — K5900 Constipation, unspecified: Secondary | ICD-10-CM

## 2018-11-28 DIAGNOSIS — E785 Hyperlipidemia, unspecified: Secondary | ICD-10-CM | POA: Diagnosis not present

## 2018-11-28 DIAGNOSIS — E1140 Type 2 diabetes mellitus with diabetic neuropathy, unspecified: Secondary | ICD-10-CM

## 2018-11-28 DIAGNOSIS — D649 Anemia, unspecified: Secondary | ICD-10-CM | POA: Diagnosis not present

## 2018-11-28 DIAGNOSIS — G4700 Insomnia, unspecified: Secondary | ICD-10-CM

## 2018-11-28 DIAGNOSIS — K219 Gastro-esophageal reflux disease without esophagitis: Secondary | ICD-10-CM | POA: Diagnosis not present

## 2018-11-28 LAB — CBC WITH DIFFERENTIAL/PLATELET
Basophils Absolute: 0.1 10*3/uL (ref 0.0–0.1)
Basophils Relative: 0.7 % (ref 0.0–3.0)
Eosinophils Absolute: 0.4 10*3/uL (ref 0.0–0.7)
Eosinophils Relative: 4.7 % (ref 0.0–5.0)
HCT: 34.9 % — ABNORMAL LOW (ref 36.0–46.0)
Hemoglobin: 12 g/dL (ref 12.0–15.0)
LYMPHS ABS: 2.4 10*3/uL (ref 0.7–4.0)
Lymphocytes Relative: 31.4 % (ref 12.0–46.0)
MCHC: 34.2 g/dL (ref 30.0–36.0)
MCV: 89.9 fl (ref 78.0–100.0)
MONO ABS: 0.6 10*3/uL (ref 0.1–1.0)
Monocytes Relative: 7.3 % (ref 3.0–12.0)
Neutro Abs: 4.3 10*3/uL (ref 1.4–7.7)
Neutrophils Relative %: 55.9 % (ref 43.0–77.0)
Platelets: 323 10*3/uL (ref 150.0–400.0)
RBC: 3.89 Mil/uL (ref 3.87–5.11)
RDW: 13.7 % (ref 11.5–15.5)
WBC: 7.7 10*3/uL (ref 4.0–10.5)

## 2018-11-28 LAB — URINALYSIS, ROUTINE W REFLEX MICROSCOPIC
Bilirubin Urine: NEGATIVE
Hgb urine dipstick: NEGATIVE
Ketones, ur: NEGATIVE
Nitrite: NEGATIVE
RBC / HPF: NONE SEEN (ref 0–?)
Specific Gravity, Urine: 1.01 (ref 1.000–1.030)
Total Protein, Urine: NEGATIVE
Urine Glucose: NEGATIVE
Urobilinogen, UA: 0.2 (ref 0.0–1.0)
pH: 6 (ref 5.0–8.0)

## 2018-11-28 LAB — BASIC METABOLIC PANEL
BUN: 19 mg/dL (ref 6–23)
CO2: 27 mEq/L (ref 19–32)
Calcium: 9.7 mg/dL (ref 8.4–10.5)
Chloride: 104 mEq/L (ref 96–112)
Creatinine, Ser: 0.71 mg/dL (ref 0.40–1.20)
GFR: 83.02 mL/min (ref >60.00)
Glucose, Bld: 107 mg/dL — ABNORMAL HIGH (ref 70–99)
Potassium: 4.8 mEq/L (ref 3.5–5.1)
Sodium: 140 mEq/L (ref 135–145)

## 2018-11-28 LAB — IRON,TIBC AND FERRITIN PANEL
%SAT: 28 % (calc) (ref 16–45)
Ferritin: 125 ng/mL (ref 16–288)
Iron: 79 ug/dL (ref 45–160)
TIBC: 286 mcg/dL (calc) (ref 250–450)

## 2018-11-28 LAB — HEPATIC FUNCTION PANEL
ALT: 9 U/L (ref 0–35)
AST: 15 U/L (ref 0–37)
Albumin: 4.1 g/dL (ref 3.5–5.2)
Alkaline Phosphatase: 60 U/L (ref 39–117)
BILIRUBIN DIRECT: 0 mg/dL (ref 0.0–0.3)
Total Bilirubin: 0.3 mg/dL (ref 0.2–1.2)
Total Protein: 7.2 g/dL (ref 6.0–8.3)

## 2018-11-28 LAB — LDL CHOLESTEROL, DIRECT: Direct LDL: 94 mg/dL

## 2018-11-28 LAB — LIPID PANEL
CHOLESTEROL: 171 mg/dL (ref 0–200)
HDL: 44.9 mg/dL (ref >39.00)
NonHDL: 126.14
Total CHOL/HDL Ratio: 4
Triglycerides: 254 mg/dL — ABNORMAL HIGH (ref 0.0–149.0)
VLDL: 50.8 mg/dL — ABNORMAL HIGH (ref 0.0–40.0)

## 2018-11-28 LAB — TSH: TSH: 1.16 u[IU]/mL (ref 0.35–4.50)

## 2018-11-28 MED ORDER — ZOLPIDEM TARTRATE 10 MG PO TABS: 5.0000 mg | ORAL_TABLET | Freq: Every evening | ORAL | 3 refills | Status: DC | PRN

## 2018-11-28 NOTE — Patient Instructions (Addendum)
Cologuard

## 2018-11-28 NOTE — Progress Notes (Signed)
Subjective:  Patient ID: Toni Mcmillan Loring, female    DOB: 12/25/1930  Age: 85 y.o. MRN: [id number]  CC: No chief complaint on file.   HPI Toni Mcmillan Vanegas presents for constipation - severe BM qod F/u GERD, insomnia   Outpatient Medications Prior to Visit  Medication Sig Dispense Refill  . aspirin EC 81 MG tablet Take 81 mg by mouth at bedtime.     Marland Kitchen. aspirin-acetaminophen-caffeine (EXCEDRIN MIGRAINE) 250-250-65 MG per tablet Take 1-2 tablets by mouth every 6 (six) hours as needed for headache.    . Calcium Carbonate-Vit D-Min (CALCIUM 1200 PO) Take 1 tablet by mouth every morning.     . Cholecalciferol 1000 UNITS tablet Take 1,000 Units by mouth every morning.     . gabapentin (NEURONTIN) 100 MG capsule Take 1 capsule (100 mg total) by mouth 2 (two) times daily as needed (neuropathy). 60 capsule 5  . ipratropium (ATROVENT) 0.03 % nasal spray ipratropium bromide 0.03 % nasal spray    . ketoconazole (NIZORAL) 2 % cream Apply 1 application topically 2 (two) times daily. 15 g 0  . methocarbamol (ROBAXIN) 500 MG tablet Take 1 tablet (500 mg total) by mouth every 8 (eight) hours as needed. 20 tablet 0  . methylPREDNISolone (MEDROL DOSEPAK) 4 MG TBPK tablet Taper as directed 21 tablet 0  . metoprolol tartrate (LOPRESSOR) 50 MG tablet TAKE ONE AND ONE-HALF (1 & 1/2) TABLET BY MOUTH TWO TIMES A DAY 270 tablet 3  . Multiple Vitamins-Minerals (MULTIVITAMIN,TX-MINERALS) tablet Take 1 tablet by mouth every morning.     . Omega-3 Fatty Acids (FISH OIL PO) Take 1 tablet by mouth every morning.    . pantoprazole (PROTONIX) 40 MG tablet TAKE ONE TABLET BY MOUTH TWICE A DAY FOR 1 WEEK THEN TAKE ONE TABLET BY MOUTH DAILY IN THE MORNING. 180 tablet 2  . Polyvinyl Alcohol-Povidone (REFRESH OP) Apply 1-2 drops to eye daily as needed (dry eyes).    . ramipril (ALTACE) 2.5 MG capsule TAKE ONE CAPSULE BY MOUTH DAILY 90 capsule 3  . simvastatin (ZOCOR) 40 MG tablet TAKE ONE TABLET BY MOUTH EVERY NIGHT AT  BEDTIME 90 tablet 3  . vitamin C (ASCORBIC ACID) 500 MG tablet Take 500 mg by mouth 2 (two) times daily.    . Wheat Dextrin (BENEFIBER PO) Take 10 mLs by mouth daily.    Marland Kitchen. zolpidem (AMBIEN) 10 MG tablet TAKE ONE TABLET BY MOUTH EVERY NIGHT AT BEDTIME AS NEEDED FOR SLEEP 30 tablet 1   No facility-administered medications prior to visit.     ROS: Review of Systems  Constitutional: Negative for activity change, appetite change, chills, fatigue and unexpected weight change.  HENT: Negative for congestion, mouth sores and sinus pressure.   Eyes: Negative for visual disturbance.  Respiratory: Negative for cough and chest tightness.   Gastrointestinal: Negative for abdominal pain and nausea.  Genitourinary: Negative for difficulty urinating, frequency and vaginal pain.  Musculoskeletal: Negative for back pain and gait problem.  Skin: Negative for pallor and rash.  Neurological: Negative for dizziness, tremors, weakness, numbness and headaches.  Psychiatric/Behavioral: Negative for confusion, sleep disturbance and suicidal ideas.    Objective:  BP 122/74 (BP Location: Right Arm, Patient Position: Sitting, Cuff Size: Normal)   Pulse 70   Temp 98 F (36.7 C) (Oral)   Ht 5\' 2"  (1.575 m)   Wt 117 lb (53.1 kg)   SpO2 98%   BMI 21.40 kg/m   BP Readings from Last 3 Encounters:  11/28/18  122/74  10/24/18 128/76  08/15/18 122/60    Wt Readings from Last 3 Encounters:  11/28/18 117 lb (53.1 kg)  10/24/18 116 lb (52.6 kg)  08/15/18 115 lb (52.2 kg)    Physical Exam Constitutional:      General: She is not in acute distress.    Appearance: She is well-developed.  HENT:     Head: Normocephalic.     Right Ear: External ear normal.     Left Ear: External ear normal.     Nose: Nose normal.  Eyes:     General:        Right eye: No discharge.        Left eye: No discharge.     Conjunctiva/sclera: Conjunctivae normal.     Pupils: Pupils are equal, round, and reactive to light.  Neck:      Musculoskeletal: Normal range of motion and neck supple.     Thyroid: No thyromegaly.     Vascular: No JVD.     Trachea: No tracheal deviation.  Cardiovascular:     Rate and Rhythm: Normal rate and regular rhythm.     Heart sounds: Normal heart sounds.  Pulmonary:     Effort: No respiratory distress.     Breath sounds: No stridor. No wheezing.  Abdominal:     General: Bowel sounds are normal. There is no distension.     Palpations: Abdomen is soft. There is no mass.     Tenderness: There is no abdominal tenderness. There is no guarding or rebound.  Musculoskeletal:        General: No tenderness.  Lymphadenopathy:     Cervical: No cervical adenopathy.  Skin:    Findings: No erythema or rash.  Neurological:     Cranial Nerves: No cranial nerve deficit.     Motor: No abnormal muscle tone.     Coordination: Coordination normal.     Deep Tendon Reflexes: Reflexes normal.  Psychiatric:        Behavior: Behavior normal.        Thought Content: Thought content normal.        Judgment: Judgment normal.     Lab Results  Component Value Date   WBC 8.2 05/29/2018   HGB 12.3 05/29/2018   HCT 37.2 05/29/2018   PLT 320.0 05/29/2018   GLUCOSE 107 (H) 05/29/2018   CHOL 145 01/11/2018   TRIG 144.0 01/11/2018   HDL 42.90 01/11/2018   LDLCALC 73 01/11/2018   ALT 8 01/11/2018   AST 16 01/11/2018   NA 140 05/29/2018   K 4.9 05/29/2018   CL 102 05/29/2018   CREATININE 0.81 05/29/2018   BUN 14 05/29/2018   CO2 31 05/29/2018   TSH 0.75 01/11/2018   HGBA1C 6.2 05/29/2018    Dg Elbow Complete Left  Result Date: 08/15/2018 CLINICAL DATA:  Fall yesterday.  Posterior elbow pain. EXAM: LEFT ELBOW - COMPLETE 3+ VIEW COMPARISON:  None. FINDINGS: No fracture.  No bone lesion. No arthropathic changes.  No joint effusion. Soft tissues are unremarkable. IMPRESSION: Negative. Electronically Signed   By: Amie Portlandavid  Ormond M.D.   On: 08/15/2018 13:00   Dg Hip Unilat With Pelvis 2-3 Views  Left  Result Date: 08/15/2018 CLINICAL DATA:  Larey SeatFell yesterday.  Posterior hip pain on the left. EXAM: DG HIP (WITH OR WITHOUT PELVIS) 2-3V LEFT COMPARISON:  None. FINDINGS: No fracture or bone lesion. Hip joint, SI joints and pubic symphysis are normally spaced and aligned. Bones are demineralized. Soft tissues are  unremarkable. IMPRESSION: Negative. Electronically Signed   By: Amie Portland M.D.   On: 08/15/2018 13:01    Assessment & Plan:   There are no diagnoses linked to this encounter.   No orders of the defined types were placed in this encounter.    Follow-up: No follow-ups on file.  Sonda Primes, MD

## 2019-01-29 ENCOUNTER — Other Ambulatory Visit: Payer: Self-pay | Admitting: Internal Medicine

## 2019-02-06 ENCOUNTER — Other Ambulatory Visit: Payer: Self-pay | Admitting: Internal Medicine

## 2019-03-12 ENCOUNTER — Other Ambulatory Visit: Payer: Self-pay | Admitting: Internal Medicine

## 2019-05-31 DIAGNOSIS — H04123 Dry eye syndrome of bilateral lacrimal glands: Secondary | ICD-10-CM | POA: Diagnosis not present

## 2019-05-31 DIAGNOSIS — E113213 Type 2 diabetes mellitus with mild nonproliferative diabetic retinopathy with macular edema, bilateral: Secondary | ICD-10-CM | POA: Diagnosis not present

## 2019-05-31 DIAGNOSIS — H43823 Vitreomacular adhesion, bilateral: Secondary | ICD-10-CM | POA: Diagnosis not present

## 2019-05-31 DIAGNOSIS — Z961 Presence of intraocular lens: Secondary | ICD-10-CM | POA: Diagnosis not present

## 2019-08-06 ENCOUNTER — Other Ambulatory Visit: Payer: Self-pay | Admitting: Internal Medicine

## 2019-08-20 ENCOUNTER — Ambulatory Visit (INDEPENDENT_AMBULATORY_CARE_PROVIDER_SITE_OTHER): Payer: Medicare Other

## 2019-08-20 ENCOUNTER — Other Ambulatory Visit: Payer: Self-pay

## 2019-08-20 DIAGNOSIS — Z23 Encounter for immunization: Secondary | ICD-10-CM | POA: Diagnosis not present

## 2019-09-26 ENCOUNTER — Encounter: Payer: Self-pay | Admitting: Family

## 2019-09-26 ENCOUNTER — Other Ambulatory Visit: Payer: Self-pay

## 2019-09-26 ENCOUNTER — Ambulatory Visit (INDEPENDENT_AMBULATORY_CARE_PROVIDER_SITE_OTHER): Payer: Medicare Other | Admitting: Family

## 2019-09-26 ENCOUNTER — Other Ambulatory Visit: Payer: Medicare Other

## 2019-09-26 VITALS — BP 126/72 | HR 64 | Temp 98.0°F | Ht 62.0 in | Wt 113.4 lb

## 2019-09-26 DIAGNOSIS — N390 Urinary tract infection, site not specified: Secondary | ICD-10-CM | POA: Diagnosis not present

## 2019-09-26 LAB — POC URINALSYSI DIPSTICK (AUTOMATED)
Bilirubin, UA: NEGATIVE
Blood, UA: NEGATIVE
Glucose, UA: NEGATIVE
Ketones, UA: NEGATIVE
Nitrite, UA: NEGATIVE
Protein, UA: NEGATIVE
Spec Grav, UA: 1.02 (ref 1.010–1.025)
Urobilinogen, UA: 0.2 E.U./dL
pH, UA: 6 (ref 5.0–8.0)

## 2019-09-26 MED ORDER — CIPROFLOXACIN HCL 250 MG PO TABS: 250.0000 mg | ORAL_TABLET | Freq: Two times a day (BID) | ORAL | 0 refills | Status: DC

## 2019-09-26 NOTE — Addendum Note (Signed)
Addended by: Marcina Millard on: 09/26/2019 11:00 AM   Modules accepted: Orders

## 2019-09-26 NOTE — Progress Notes (Signed)
Toni Mcmillan is a 85 y.o. female with the following history as recorded in [employer]:  Patient Active Problem List   Diagnosis Date Noted  . RLQ abdominal pain 05/29/2018  . GERD (gastroesophageal reflux disease) 01/11/2018  . Toe pain 08/16/2017  . Foot pain, bilateral 01/10/2017  . Acute cystitis 11/27/2016  . Dyspnea on exertion 05/07/2016  . Palpitations 05/07/2016  . Dizziness 03/30/2016  . Vitreomacular traction syndrome of both eyes 03/25/2016  . Constipation 11/19/2015  . 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 Paresthesia of both feet 06/04/2015  . Abdominal bloating 04/23/2015  . Stool incontinence 04/23/2015  . Blood in stool 04/23/2015  . Well adult exam 07/11/2014  . Syncope and collapse 05/09/2014  . Dysautonomia orthostatic hypotension syndrome (HCC) 12/15/2013  . Allergic conjunctivitis 03/22/2013  . Vitreomacular adhesion of both eyes 03/22/2013  . Status post intraocular lens implant 06/22/2012  . Diabetic macular edema of left eye (HCC) 09/23/2011  . Nonproliferative diabetic retinopathy (HCC) 09/23/2011  . Routine general medical examination at a health care facility 05/11/2011  . Type 2 diabetes, controlled, with neuropathy (HCC) 12/16/2009  . Dyslipidemia 10/30/2007  . Essential hypertension 10/30/2007  . SINUSITIS, CHRONIC 10/30/2007  . ALLERGIC RHINITIS 10/30/2007  . Insomnia 10/30/2007  . ANOSMIA 10/30/2007  . DYSPEPSIA, HX OF 10/30/2007  . CATARACT EXTRACTIONS, BILATERAL, HX OF 10/30/2007    Current Outpatient Medications  Medication Sig Dispense Refill  . aspirin EC 81 MG tablet Take 81 mg by mouth at bedtime.     Marland Kitchen aspirin-acetaminophen-caffeine (EXCEDRIN MIGRAINE) 250-250-65 MG per tablet Take 1-2 tablets by mouth every 6 (six) hours as needed for headache.    . Calcium Carbonate-Vit D-Min (CALCIUM 1200 PO) Take 1 tablet by mouth every morning.     . Cholecalciferol 1000 UNITS tablet  Take 1,000 Units by mouth every morning.     Marland Kitchen ipratropium (ATROVENT) 0.03 % nasal spray ipratropium bromide 0.03 % nasal spray    . ketoconazole (NIZORAL) 2 % cream Apply 1 application topically 2 (two) times daily. 15 g 0  . Magnesium Gluconate 550 MG TABS Take by mouth.    . metoprolol tartrate (LOPRESSOR) 50 MG tablet TAKE ONE AND ONE-HALF (1 & 1/2) TABLET BY MOUTH TWO TIMES A DAY 270 tablet 3  . Multiple Vitamins-Minerals (MULTIVITAMIN,TX-MINERALS) tablet Take 1 tablet by mouth every morning.     . Omega-3 Fatty Acids (FISH OIL PO) Take 1 tablet by mouth every morning.    . pantoprazole (PROTONIX) 40 MG tablet Take 1 tablet (40 mg total) by mouth daily. 90 tablet 0  . Polyvinyl Alcohol-Povidone (REFRESH OP) Apply 1-2 drops to eye daily as needed (dry eyes).    . ramipril (ALTACE) 2.5 MG capsule TAKE ONE CAPSULE BY MOUTH DAILY 90 capsule 3  . simvastatin (ZOCOR) 40 MG tablet TAKE ONE TABLET BY MOUTH EVERY NIGHT AT BEDTIME 90 tablet 2  . vitamin C (ASCORBIC ACID) 500 MG tablet Take 500 mg by mouth 2 (two) times daily.    . Wheat Dextrin (BENEFIBER PO) Take 10 mLs by mouth daily.    Marland Kitchen zolpidem (AMBIEN) 10 MG tablet TAKE ONE TABLET BY MOUTH EVERY NIGHT AT BEDTIME AS NEEDED FOR SLEEP 30 tablet 2   No current facility-administered medications for this visit.     Allergies: Celecoxib, Clindamycin hcl, Erythromycin, Latex, Meperidine, Meperidine hcl, Morphine, Tetracycline, and Triprolidine-pseudoephedrine  Past Medical History:  Diagnosis Date  . Allergic rhinitis, cause unspecified   .  Diabetes mellitus without complication ([place])    borderline"never on meds"  . Disturbances of sensation of smell and taste   . Dizziness   . Headache(784.0)    has decreased to none in later years  . Insomnia, unspecified   . Other and unspecified hyperlipidemia   . Personal history of other diseases of digestive system   . Scarlet fever   . Unspecified essential hypertension    Metoprolol decreased due  to drop in BP readings.  Marland Kitchen Unspecified sinusitis (chronic)   . Whooping cough, unspecified organism     Past Surgical History:  Procedure Laterality Date  . ABDOMINAL HYSTERECTOMY    . ANAL RECTAL MANOMETRY N/A 07/07/2015   Procedure: ANO RECTAL MANOMETRY;  Surgeon: Garlan Fair, MD;  Location: [facility];  Service: Endoscopy;  Laterality: N/A;  . BILATERAL OOPHORECTOMY    . CATARACT EXTRACTION, BILATERAL    . COLONOSCOPY W/ POLYPECTOMY    . COLONOSCOPY WITH PROPOFOL N/A 05/29/2015   Procedure: COLONOSCOPY WITH PROPOFOL;  Surgeon: Garlan Fair, MD;  Location: [facility];  Service: Endoscopy;  Laterality: N/A;  . correction of hammertoe deformity    . CYSTOSCOPY     w/ stenting of both ureters  . HEMORRHOID SURGERY    . hydrosalpinx diagnostic laparoscopy    . TONSILLECTOMY  age 89  . vericose vein surgery      Family History  Problem Relation Age of Onset  . Cancer Mother        ?  . Diabetes Mother   . Other Mother        coagulopathy/ ruptured viscus  . Hypertension Father   . Hyperlipidemia Father        ?  Marland Kitchen Other Father        CVA  . Cancer Paternal Grandmother        breast    Social History   Tobacco Use  . Smoking status: Never Smoker  . Smokeless tobacco: Never Used  Substance Use Topics  . Alcohol use: No    Alcohol/week: 0.0 standard drinks    Subjective:  Patient presents with concerns for UTI; symptoms started over the weekend; + burning with urination; pelvic pressure;  no fever or blood in the urine; no back pain; does occasionally get UTIs; last one was in 2018;      Objective:  Vitals:   09/26/19 0951  BP: 126/72  Pulse: 64  Temp: 98 F (36.7 C)  TempSrc: Oral  SpO2: 98%  Weight: 113 lb 6.4 oz (51.4 kg)  Height: 5\' 2"  (1.575 m)    General: Well developed, well nourished, in no acute distress  Skin : Warm and dry.  Head: Normocephalic and atraumatic  Lungs: Respirations unlabored; clear to auscultation bilaterally without  wheeze, rales, rhonchi  CVS exam: normal rate and regular rhythm.   Neurologic: Alert and oriented; speech intact; face symmetrical; moves all extremities well; CNII-XII intact without focal deficit   Assessment:  1. Urinary tract infection without hematuria, site unspecified     Plan:  Check U/A and urine culture; Rx for Cipro 250 mg bid x 3 days- has tolerated well in the past and requests again; increase fluids, rest and follow-up worse, no better.   No follow-ups on file.  Orders Placed This Encounter  Procedures  . Urine Culture    Standing Status:   Future    Standing Expiration Date:   09/25/2020    Requested Prescriptions    No prescriptions requested  or ordered in this encounter

## 2019-09-28 ENCOUNTER — Other Ambulatory Visit: Payer: Self-pay | Admitting: Family

## 2019-09-28 LAB — URINE CULTURE
MICRO NUMBER:: 1089016
SPECIMEN QUALITY:: ADEQUATE

## 2019-09-28 MED ORDER — NITROFURANTOIN MONOHYD MACRO 100 MG PO CAPS: 100.0000 mg | ORAL_CAPSULE | Freq: Two times a day (BID) | ORAL | 0 refills | Status: DC

## 2019-10-01 ENCOUNTER — Ambulatory Visit (INDEPENDENT_AMBULATORY_CARE_PROVIDER_SITE_OTHER): Payer: Medicare Other | Admitting: Family

## 2019-10-01 ENCOUNTER — Other Ambulatory Visit: Payer: Self-pay

## 2019-10-01 ENCOUNTER — Ambulatory Visit (INDEPENDENT_AMBULATORY_CARE_PROVIDER_SITE_OTHER)
Admission: RE | Admit: 2019-10-01 | Discharge: 2019-10-01 | Disposition: A | Discharge status: Home / Self Care | Payer: Medicare Other | Source: Ambulatory Visit | Attending: Family | Admitting: Family

## 2019-10-01 ENCOUNTER — Encounter: Payer: Self-pay | Admitting: Family

## 2019-10-01 VITALS — BP 128/80 | HR 67 | Temp 97.8°F | Ht 62.0 in | Wt 113.4 lb

## 2019-10-01 DIAGNOSIS — M7989 Other specified soft tissue disorders: Secondary | ICD-10-CM | POA: Diagnosis not present

## 2019-10-01 DIAGNOSIS — M25531 Pain in right wrist: Secondary | ICD-10-CM

## 2019-10-01 DIAGNOSIS — T50905A Adverse effect of unspecified drugs, medicaments and biological substances, initial encounter: Secondary | ICD-10-CM

## 2019-10-01 DIAGNOSIS — M79641 Pain in right hand: Secondary | ICD-10-CM | POA: Diagnosis not present

## 2019-10-01 MED ORDER — NAPROXEN SODIUM 220 MG PO TABS: 220.0000 mg | ORAL_TABLET | Freq: Two times a day (BID) | ORAL | Status: DC

## 2019-10-01 NOTE — Progress Notes (Signed)
Toni Mcmillan is a 85 y.o. female with the following history as recorded in Epic[employer]:  Patient Active Problem List   Diagnosis Date Noted  . RLQ abdominal pain 05/29/2018  . GERD (gastroesophageal reflux disease) 01/11/2018  . Toe pain 08/16/2017  . Foot pain, bilateral 01/10/2017  . Acute cystitis 11/27/2016  . Dyspnea on exertion 05/07/2016  . Palpitations 05/07/2016  . Dizziness 03/30/2016  . Vitreomacular traction syndrome of both eyes 03/25/2016  . Constipation 11/19/2015  . 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 Paresthesia of both feet 06/04/2015  . Abdominal bloating 04/23/2015  . Stool incontinence 04/23/2015  . Blood in stool 04/23/2015  . Well adult exam 07/11/2014  . Syncope and collapse 05/09/2014  . Dysautonomia orthostatic hypotension syndrome (HCC) 12/15/2013  . Allergic conjunctivitis 03/22/2013  . Vitreomacular adhesion of both eyes 03/22/2013  . Status post intraocular lens implant 06/22/2012  . Diabetic macular edema of left eye (HCC) 09/23/2011  . Nonproliferative diabetic retinopathy (HCC) 09/23/2011  . Routine general medical examination at a health care facility 05/11/2011  . Type 2 diabetes, controlled, with neuropathy (HCC) 12/16/2009  . Dyslipidemia 10/30/2007  . Essential hypertension 10/30/2007  . SINUSITIS, CHRONIC 10/30/2007  . ALLERGIC RHINITIS 10/30/2007  . Insomnia 10/30/2007  . ANOSMIA 10/30/2007  . DYSPEPSIA, HX OF 10/30/2007  . CATARACT EXTRACTIONS, BILATERAL, HX OF 10/30/2007    Current Outpatient Medications  Medication Sig Dispense Refill  . aspirin EC 81 MG tablet Take 81 mg by mouth at bedtime.     Marland Kitchen aspirin-acetaminophen-caffeine (EXCEDRIN MIGRAINE) 250-250-65 MG per tablet Take 1-2 tablets by mouth every 6 (six) hours as needed for headache.    . Calcium Carbonate-Vit D-Min (CALCIUM 1200 PO) Take 1 tablet by mouth every morning.     . Cholecalciferol 1000 UNITS tablet  Take 1,000 Units by mouth every morning.     Marland Kitchen ipratropium (ATROVENT) 0.03 % nasal spray ipratropium bromide 0.03 % nasal spray    . ketoconazole (NIZORAL) 2 % cream Apply 1 application topically 2 (two) times daily. 15 g 0  . Magnesium Gluconate 550 MG TABS Take by mouth.    . metoprolol tartrate (LOPRESSOR) 50 MG tablet TAKE ONE AND ONE-HALF (1 & 1/2) TABLET BY MOUTH TWO TIMES A DAY 270 tablet 3  . Multiple Vitamins-Minerals (MULTIVITAMIN,TX-MINERALS) tablet Take 1 tablet by mouth every morning.     . Omega-3 Fatty Acids (FISH OIL PO) Take 1 tablet by mouth every morning.    . pantoprazole (PROTONIX) 40 MG tablet Take 1 tablet (40 mg total) by mouth daily. 90 tablet 0  . Polyvinyl Alcohol-Povidone (REFRESH OP) Apply 1-2 drops to eye daily as needed (dry eyes).    . ramipril (ALTACE) 2.5 MG capsule TAKE ONE CAPSULE BY MOUTH DAILY 90 capsule 3  . simvastatin (ZOCOR) 40 MG tablet TAKE ONE TABLET BY MOUTH EVERY NIGHT AT BEDTIME 90 tablet 2  . vitamin C (ASCORBIC ACID) 500 MG tablet Take 500 mg by mouth 2 (two) times daily.    . Wheat Dextrin (BENEFIBER PO) Take 10 mLs by mouth daily.    Marland Kitchen zolpidem (AMBIEN) 10 MG tablet TAKE ONE TABLET BY MOUTH EVERY NIGHT AT BEDTIME AS NEEDED FOR SLEEP 30 tablet 2  . naproxen sodium (ALEVE) 220 MG tablet Take 1 tablet (220 mg total) by mouth 2 (two) times daily with a meal. 20 tablet    No current facility-administered medications for this visit.     Allergies:  Celecoxib, Ciprofloxacin, Clindamycin hcl, Erythromycin, Latex, Macrobid [nitrofurantoin monohyd macro], Meperidine, Meperidine hcl, Morphine, Tetracycline, and Triprolidine-pseudoephedrine  Past Medical History:  Diagnosis Date  . Allergic rhinitis, cause unspecified   . Diabetes mellitus without complication ([place])    borderline"never on meds"  . Disturbances of sensation of smell and taste   . Dizziness   . Headache(784.0)    has decreased to none in later years  . Insomnia, unspecified   . Other  and unspecified hyperlipidemia   . Personal history of other diseases of digestive system   . Scarlet fever   . Unspecified essential hypertension    Metoprolol decreased due to drop in BP readings.  Marland Kitchen Unspecified sinusitis (chronic)   . Whooping cough, unspecified organism     Past Surgical History:  Procedure Laterality Date  . ABDOMINAL HYSTERECTOMY    . ANAL RECTAL MANOMETRY N/A 07/07/2015   Procedure: ANO RECTAL MANOMETRY;  Surgeon: Garlan Fair, MD;  Location: [facility];  Service: Endoscopy;  Laterality: N/A;  . BILATERAL OOPHORECTOMY    . CATARACT EXTRACTION, BILATERAL    . COLONOSCOPY W/ POLYPECTOMY    . COLONOSCOPY WITH PROPOFOL N/A 05/29/2015   Procedure: COLONOSCOPY WITH PROPOFOL;  Surgeon: Garlan Fair, MD;  Location: [facility];  Service: Endoscopy;  Laterality: N/A;  . correction of hammertoe deformity    . CYSTOSCOPY     w/ stenting of both ureters  . HEMORRHOID SURGERY    . hydrosalpinx diagnostic laparoscopy    . TONSILLECTOMY  age 37  . vericose vein surgery      Family History  Problem Relation Age of Onset  . Cancer Mother        ?  . Diabetes Mother   . Other Mother        coagulopathy/ ruptured viscus  . Hypertension Father   . Hyperlipidemia Father        ?  Marland Kitchen Other Father        CVA  . Cancer Paternal Grandmother        breast    Social History   Tobacco Use  . Smoking status: Never Smoker  . Smokeless tobacco: Never Used  Substance Use Topics  . Alcohol use: No    Alcohol/week: 0.0 standard drinks    Subjective:  1 day history of swelling in right hand; was on Cipro last week for UTI- had to be changed to [place] based on culture; notes that she started the Macrobid on Saturday and swelling started suddenly yesterday; no known injury to the wrist or hand; has been sewing more recently; Does feel that UTI has resolved completely;    Objective:  Vitals:   10/01/19 0941  BP: 128/80  Pulse: 67  Temp: 97.8 F (36.6 C)   TempSrc: Oral  SpO2: 97%  Weight: 113 lb 6.4 oz (51.4 kg)  Height: 5\' 2"  (1.575 m)    General: Well developed, well nourished, in no acute distress  Skin : Warm and dry.  Head: Normocephalic and atraumatic  Lungs: Respirations unlabored;  Musculoskeletal: No deformities; marked inflammation noted over right wrist; FROM on active and passive motion Extremities: No edema, cyanosis, clubbing  Vessels: Symmetric bilaterally  Neurologic: Alert and oriented; speech intact; face symmetrical; moves all extremities well; CNII-XII intact without focal deficit   Assessment:  1. Right wrist pain   2. Adverse effect of drug, initial encounter     Plan:  ? Reaction to recent use of Cipro- will list as an allergy;  X-rays are reassuring; patient to continue icing wrist and rest; she defers prescriptive medication- opts to use OTC Aleve; follow-up worse, no better and will need to refer to sports medicine.  Will hold antibiotics for UTI as patient notes she is having no symptoms; if symptoms return, she will drop off sample for repeat culture.   No follow-ups on file.  Orders Placed This Encounter  Procedures  . DG Hand Complete Right    Standing Status:   Future    Number of Occurrences:   1    Standing Expiration Date:   11/30/2020    Order Specific Question:   Reason for Exam (SYMPTOM  OR DIAGNOSIS REQUIRED)    Answer:   right hand pain    Order Specific Question:   Preferred imaging location?    Answer:   [address]    Order Specific Question:   Radiology Contrast Protocol - do NOT remove file path    Answer:   \\[email or web address]s.pdf  . DG Wrist Complete Right    Standing Status:   Future    Number of Occurrences:   1    Standing Expiration Date:   11/30/2020    Order Specific Question:   Reason for Exam (SYMPTOM  OR DIAGNOSIS REQUIRED)    Answer:   right wrist pain    Order Specific Question:   Preferred imaging location?    Answer:    [facility] [address]    Order Specific Question:   Radiology Contrast Protocol - do NOT remove file path    Answer:   \\[email or web address]s.pdf    Requested Prescriptions   Signed Prescriptions Disp Refills  . naproxen sodium (ALEVE) 220 MG tablet 20 tablet     Sig: Take 1 tablet (220 mg total) by mouth 2 (two) times daily with a meal.

## 2019-10-01 NOTE — Patient Instructions (Addendum)
Please stop the antibiotics, ice the wrist and use your Aleve as we discussed; please let me know if the symptoms persist.

## 2019-10-05 ENCOUNTER — Other Ambulatory Visit: Payer: Medicare Other

## 2019-10-05 ENCOUNTER — Other Ambulatory Visit: Payer: Self-pay | Admitting: Family

## 2019-10-05 DIAGNOSIS — N390 Urinary tract infection, site not specified: Secondary | ICD-10-CM

## 2019-10-06 LAB — URINE CULTURE
MICRO NUMBER:: 1124168
Result:: NO GROWTH
SPECIMEN QUALITY:: ADEQUATE

## 2019-11-12 ENCOUNTER — Other Ambulatory Visit: Payer: Self-pay | Admitting: Internal Medicine

## 2019-12-08 ENCOUNTER — Ambulatory Visit: Payer: Medicare Other | Attending: Internal Medicine

## 2019-12-08 DIAGNOSIS — Z23 Encounter for immunization: Secondary | ICD-10-CM | POA: Insufficient documentation

## 2019-12-08 NOTE — Progress Notes (Signed)
   Covid-19 Vaccination Clinic  Name:  Toni Mcmillan    MRN: [id number] DOB: 1931/06/06  12/08/2019  [name] was observed post Covid-19 immunization for 15 minutes without incidence. She was provided with Vaccine Information Sheet and instruction to access the V-Safe system.   [name] was instructed to call 911 with any severe reactions post vaccine: Marland Kitchen Difficulty breathing  . Swelling of your face and throat  . A fast heartbeat  . A bad rash all over your body  . Dizziness and weakness    Immunizations Administered    Name Date Dose VIS Date Route   Pfizer COVID-19 Vaccine 12/08/2019 11:43 AM 0.3 mL 10/26/2019 Intramuscular   Manufacturer: [employer], [employer]   Lot: [id number]   NDC: [address]-5

## 2019-12-10 ENCOUNTER — Other Ambulatory Visit: Payer: Self-pay | Admitting: Internal Medicine

## 2019-12-11 ENCOUNTER — Encounter: Payer: Self-pay | Admitting: Internal Medicine

## 2019-12-11 ENCOUNTER — Other Ambulatory Visit: Payer: Self-pay

## 2019-12-11 ENCOUNTER — Ambulatory Visit: Payer: Medicare Other | Admitting: Internal Medicine

## 2019-12-11 ENCOUNTER — Ambulatory Visit (INDEPENDENT_AMBULATORY_CARE_PROVIDER_SITE_OTHER): Payer: Medicare Other

## 2019-12-11 VITALS — BP 136/82 | HR 47 | Temp 98.3°F | Ht 62.0 in | Wt 112.0 lb

## 2019-12-11 DIAGNOSIS — E785 Hyperlipidemia, unspecified: Secondary | ICD-10-CM

## 2019-12-11 DIAGNOSIS — E1140 Type 2 diabetes mellitus with diabetic neuropathy, unspecified: Secondary | ICD-10-CM

## 2019-12-11 DIAGNOSIS — F5101 Primary insomnia: Secondary | ICD-10-CM | POA: Diagnosis not present

## 2019-12-11 DIAGNOSIS — G8929 Other chronic pain: Secondary | ICD-10-CM

## 2019-12-11 DIAGNOSIS — M546 Pain in thoracic spine: Secondary | ICD-10-CM

## 2019-12-11 DIAGNOSIS — I10 Essential (primary) hypertension: Secondary | ICD-10-CM

## 2019-12-11 DIAGNOSIS — Z0000 Encounter for general adult medical examination without abnormal findings: Secondary | ICD-10-CM | POA: Diagnosis not present

## 2019-12-11 LAB — HEPATIC FUNCTION PANEL
ALT: 10 U/L (ref 0–35)
AST: 16 U/L (ref 0–37)
Albumin: 4.2 g/dL (ref 3.5–5.2)
Alkaline Phosphatase: 57 U/L (ref 39–117)
Bilirubin, Direct: 0.1 mg/dL (ref 0.0–0.3)
Total Bilirubin: 0.3 mg/dL (ref 0.2–1.2)
Total Protein: 7.3 g/dL (ref 6.0–8.3)

## 2019-12-11 LAB — URINALYSIS, ROUTINE W REFLEX MICROSCOPIC
Bilirubin Urine: NEGATIVE
Hgb urine dipstick: NEGATIVE
Ketones, ur: NEGATIVE
Nitrite: NEGATIVE
Specific Gravity, Urine: 1.025 (ref 1.000–1.030)
Total Protein, Urine: NEGATIVE
Urine Glucose: NEGATIVE
Urobilinogen, UA: 0.2 (ref 0.0–1.0)
pH: 6.5 (ref 5.0–8.0)

## 2019-12-11 LAB — TSH: TSH: 1.39 u[IU]/mL (ref 0.35–4.50)

## 2019-12-11 LAB — CBC WITH DIFFERENTIAL/PLATELET
Basophils Absolute: 0 10*3/uL (ref 0.0–0.1)
Basophils Relative: 0.7 % (ref 0.0–3.0)
Eosinophils Absolute: 0.4 10*3/uL (ref 0.0–0.7)
Eosinophils Relative: 5 % (ref 0.0–5.0)
HCT: 35.5 % — ABNORMAL LOW (ref 36.0–46.0)
Hemoglobin: 11.7 g/dL — ABNORMAL LOW (ref 12.0–15.0)
Lymphocytes Relative: 30.9 % (ref 12.0–46.0)
Lymphs Abs: 2.2 10*3/uL (ref 0.7–4.0)
MCHC: 32.9 g/dL (ref 30.0–36.0)
MCV: 92.8 fl (ref 78.0–100.0)
Monocytes Absolute: 0.5 10*3/uL (ref 0.1–1.0)
Monocytes Relative: 7.6 % (ref 3.0–12.0)
Neutro Abs: 3.9 10*3/uL (ref 1.4–7.7)
Neutrophils Relative %: 55.8 % (ref 43.0–77.0)
Platelets: 310 10*3/uL (ref 150.0–400.0)
RBC: 3.82 Mil/uL — ABNORMAL LOW (ref 3.87–5.11)
RDW: 13.2 % (ref 11.5–15.5)
WBC: 7.1 10*3/uL (ref 4.0–10.5)

## 2019-12-11 LAB — LIPID PANEL
Cholesterol: 158 mg/dL (ref 0–200)
HDL: 42.5 mg/dL (ref >39.00)
NonHDL: 115.55
Total CHOL/HDL Ratio: 4
Triglycerides: 256 mg/dL — ABNORMAL HIGH (ref 0.0–149.0)
VLDL: 51.2 mg/dL — ABNORMAL HIGH (ref 0.0–40.0)

## 2019-12-11 LAB — BASIC METABOLIC PANEL
BUN: 18 mg/dL (ref 6–23)
CO2: 29 mEq/L (ref 19–32)
Calcium: 9.4 mg/dL (ref 8.4–10.5)
Chloride: 103 mEq/L (ref 96–112)
Creatinine, Ser: 0.64 mg/dL (ref 0.40–1.20)
GFR: 87.83 mL/min (ref >60.00)
Glucose, Bld: 106 mg/dL — ABNORMAL HIGH (ref 70–99)
Potassium: 3.8 mEq/L (ref 3.5–5.1)
Sodium: 139 mEq/L (ref 135–145)

## 2019-12-11 LAB — HEMOGLOBIN A1C: Hgb A1c MFr Bld: 6.2 % (ref 4.6–6.5)

## 2019-12-11 LAB — LDL CHOLESTEROL, DIRECT: Direct LDL: 84 mg/dL

## 2019-12-11 MED ORDER — METOPROLOL TARTRATE 50 MG PO TABS: ORAL_TABLET | ORAL | 3 refills | Status: DC

## 2019-12-11 MED ORDER — RAMIPRIL 2.5 MG PO CAPS: 2.5000 mg | ORAL_CAPSULE | Freq: Every day | ORAL | 3 refills | Status: DC

## 2019-12-11 MED ORDER — PANTOPRAZOLE SODIUM 40 MG PO TBEC: 40.0000 mg | DELAYED_RELEASE_TABLET | Freq: Every day | ORAL | 3 refills | Status: DC

## 2019-12-11 MED ORDER — SIMVASTATIN 40 MG PO TABS: 40.0000 mg | ORAL_TABLET | Freq: Every day | ORAL | 3 refills | Status: DC

## 2019-12-11 NOTE — Assessment & Plan Note (Signed)
A1c

## 2019-12-11 NOTE — Assessment & Plan Note (Signed)
simvastatin 

## 2019-12-11 NOTE — Progress Notes (Signed)
Subjective:  Patient ID: Toni Mcmillan, female    DOB: 11-22-1930  Age: 86 y.o. MRN: [id number]  CC: No chief complaint on file.   HPI Toni Mcmillan presents for a well exam C/o pain in the mid-spine x weeks- no injury F/u HTN and GERD    Outpatient Medications Prior to Visit  Medication Sig Dispense Refill  . aspirin EC 81 MG tablet Take 81 mg by mouth at bedtime.     Marland Kitchen aspirin-acetaminophen-caffeine (EXCEDRIN MIGRAINE) 250-250-65 MG per tablet Take 1-2 tablets by mouth every 6 (six) hours as needed for headache.    . Calcium Carbonate-Vit D-Min (CALCIUM 1200 PO) Take 1 tablet by mouth every morning.     . Cholecalciferol 1000 UNITS tablet Take 1,000 Units by mouth every morning.     Marland Kitchen ipratropium (ATROVENT) 0.03 % nasal spray ipratropium bromide 0.03 % nasal spray    . ketoconazole (NIZORAL) 2 % cream Apply 1 application topically 2 (two) times daily. 15 g 0  . Magnesium Gluconate 550 MG TABS Take by mouth.    . Multiple Vitamins-Minerals (MULTIVITAMIN,TX-MINERALS) tablet Take 1 tablet by mouth every morning.     . naproxen sodium (ALEVE) 220 MG tablet Take 1 tablet (220 mg total) by mouth 2 (two) times daily with a meal. 20 tablet   . Omega-3 Fatty Acids (FISH OIL PO) Take 1 tablet by mouth every morning.    . Polyvinyl Alcohol-Povidone (REFRESH OP) Apply 1-2 drops to eye daily as needed (dry eyes).    . vitamin C (ASCORBIC ACID) 500 MG tablet Take 500 mg by mouth 2 (two) times daily.    . Wheat Dextrin (BENEFIBER PO) Take 10 mLs by mouth daily.    Marland Kitchen zolpidem (AMBIEN) 10 MG tablet TAKE ONE TABLET BY MOUTH EVERY NIGHT AT BEDTIME AS NEEDED FOR SLEEP 30 tablet 2  . metoprolol tartrate (LOPRESSOR) 50 MG tablet Take one and one-half- (1 1/2) tablet by mouth two times a day 90 tablet 0  . pantoprazole (PROTONIX) 40 MG tablet Take 1 tablet (40 mg total) by mouth daily. 90 tablet 0  . ramipril (ALTACE) 2.5 MG capsule TAKE ONE CAPSULE BY MOUTH DAILY 90 capsule 3  . simvastatin  (ZOCOR) 40 MG tablet TAKE ONE TABLET BY MOUTH EVERY NIGHT AT BEDTIME 90 tablet 1   No facility-administered medications prior to visit.    ROS: Review of Systems  Constitutional: Negative for activity change, appetite change, chills, fatigue and unexpected weight change.  HENT: Negative for congestion, mouth sores and sinus pressure.   Eyes: Negative for visual disturbance.  Respiratory: Negative for cough and chest tightness.   Gastrointestinal: Negative for abdominal pain and nausea.  Genitourinary: Negative for difficulty urinating, frequency and vaginal pain.  Musculoskeletal: Positive for arthralgias and back pain. Negative for gait problem.  Skin: Negative for pallor and rash.  Neurological: Negative for dizziness, tremors, weakness, numbness and headaches.  Psychiatric/Behavioral: Negative for confusion and sleep disturbance.    Objective:  BP 136/82 (BP Location: Left Arm, Patient Position: Sitting, Cuff Size: Normal)   Pulse (!) 47   Temp 98.3 F (36.8 C) (Oral)   Ht 5\' 2"  (1.575 m)   Wt 112 lb (50.8 kg)   SpO2 97%   BMI 20.49 kg/m   BP Readings from Last 3 Encounters:  12/11/19 136/82  10/01/19 128/80  09/26/19 126/72    Wt Readings from Last 3 Encounters:  12/11/19 112 lb (50.8 kg)  10/01/19 113 lb 6.4 oz (51.4  kg)  09/26/19 113 lb 6.4 oz (51.4 kg)    Physical Exam Constitutional:      General: She is not in acute distress.    Appearance: She is well-developed.  HENT:     Head: Normocephalic.     Right Ear: External ear normal.     Left Ear: External ear normal.     Nose: Nose normal.  Eyes:     General:        Right eye: No discharge.        Left eye: No discharge.     Conjunctiva/sclera: Conjunctivae normal.     Pupils: Pupils are equal, round, and reactive to light.  Neck:     Thyroid: No thyromegaly.     Vascular: No JVD.     Trachea: No tracheal deviation.  Cardiovascular:     Rate and Rhythm: Normal rate and regular rhythm.     Heart  sounds: Normal heart sounds.  Pulmonary:     Effort: No respiratory distress.     Breath sounds: No stridor. No wheezing.  Abdominal:     General: Bowel sounds are normal. There is no distension.     Palpations: Abdomen is soft. There is no mass.     Tenderness: There is no abdominal tenderness. There is no guarding or rebound.  Musculoskeletal:        General: Tenderness present.     Cervical back: Normal range of motion and neck supple.  Lymphadenopathy:     Cervical: No cervical adenopathy.  Skin:    Findings: No erythema or rash.  Neurological:     Cranial Nerves: No cranial nerve deficit.     Motor: No abnormal muscle tone.     Coordination: Coordination normal.     Deep Tendon Reflexes: Reflexes normal.  Psychiatric:        Behavior: Behavior normal.        Thought Content: Thought content normal.        Judgment: Judgment normal.    Thor back - tender area to the left   Lab Results  Component Value Date   WBC 7.7 11/28/2018   HGB 12.0 11/28/2018   HCT 34.9 (L) 11/28/2018   PLT 323.0 11/28/2018   GLUCOSE 107 (H) 11/28/2018   CHOL 171 11/28/2018   TRIG 254.0 (H) 11/28/2018   HDL 44.90 11/28/2018   LDLDIRECT 94.0 11/28/2018   LDLCALC 73 01/11/2018   ALT 9 11/28/2018   AST 15 11/28/2018   NA 140 11/28/2018   K 4.8 11/28/2018   CL 104 11/28/2018   CREATININE 0.71 11/28/2018   BUN 19 11/28/2018   CO2 27 11/28/2018   TSH 1.16 11/28/2018   HGBA1C 6.2 05/29/2018    DG Wrist Complete Right  Result Date: 10/01/2019 CLINICAL DATA:  Right hand pain and swelling EXAM: RIGHT WRIST - COMPLETE 3+ VIEW COMPARISON:  08/27/2012 FINDINGS: Soft tissue swelling dorsal to the wrist without underlying fracture, acute erosion, or malalignment. There is a chronic cyst/ganglion at the lunate. Chronic chondrocalcinosis. Generalized osteopenia. IMPRESSION: 1. Soft tissue swelling without acute osseous finding or change from 2013. 2. Chondrocalcinosis and osteopenia. Electronically  Signed   By: Monte Fantasia M.D.   On: 10/01/2019 10:36   DG Hand Complete Right  Result Date: 10/01/2019 CLINICAL DATA:  Atraumatic right hand pain and swelling since yesterday EXAM: RIGHT HAND - COMPLETE 3+ VIEW COMPARISON:  Wrist radiograph 08/27/2012 FINDINGS: Mild dorsal soft tissue swelling at the wrist. Chronic chondrocalcinosis and lunate lucency/cyst.  Mild lucency with chronic corticated appearance along the ulnar aspect of the middle finger MCP joint where there is joint space narrowing, Mcmillan likely degenerative rather than inflammatory. Generalized osteopenia. No acute fracture or dislocation. IMPRESSION: 1. Soft tissue swelling dorsal to the wrist without acute osseous finding or change from 2013. 2. Chondrocalcinosis and osteopenia. Electronically Signed   By: Marnee Spring M.D.   On: 10/01/2019 10:35    Assessment & Plan:   Diagnoses and all orders for this visit:  Chronic left-sided thoracic back pain -     DG Thoracic Spine 2 View; Future  Dyslipidemia -     TSH; Future -     Lipid panel; Future -     Lipid panel -     TSH  Type 2 diabetes, controlled, with neuropathy (HCC) -     TSH; Future -     Urinalysis; Future -     CBC with Differential/Platelet; Future -     Basic metabolic panel; Future -     Hepatic function panel; Future -     Hemoglobin A1c; Future -     Hemoglobin A1c -     Hepatic function panel -     Basic metabolic panel -     CBC with Differential/Platelet -     Urinalysis -     TSH  Other orders -     metoprolol tartrate (LOPRESSOR) 50 MG tablet; Take one and one-half- (1 1/2) tablet by mouth two times a day -     pantoprazole (PROTONIX) 40 MG tablet; Take 1 tablet (40 mg total) by mouth daily. -     ramipril (ALTACE) 2.5 MG capsule; Take 1 capsule (2.5 mg total) by mouth daily. -     simvastatin (ZOCOR) 40 MG tablet; Take 1 tablet (40 mg total) by mouth at bedtime.     Meds ordered this encounter  Medications  . metoprolol tartrate  (LOPRESSOR) 50 MG tablet    Sig: Take one and one-half- (1 1/2) tablet by mouth two times a day    Dispense:  270 tablet    Refill:  3  . pantoprazole (PROTONIX) 40 MG tablet    Sig: Take 1 tablet (40 mg total) by mouth daily.    Dispense:  90 tablet    Refill:  3  . ramipril (ALTACE) 2.5 MG capsule    Sig: Take 1 capsule (2.5 mg total) by mouth daily.    Dispense:  90 capsule    Refill:  3  . simvastatin (ZOCOR) 40 MG tablet    Sig: Take 1 tablet (40 mg total) by mouth at bedtime.    Dispense:  90 tablet    Refill:  3     Follow-up: Return in about 6 months (around 06/09/2020) for a follow-up visit.  Sonda Primes, MD

## 2019-12-11 NOTE — Assessment & Plan Note (Signed)
X ray Trigger point inj offered

## 2019-12-11 NOTE — Assessment & Plan Note (Signed)
Valerian root 

## 2019-12-11 NOTE — Patient Instructions (Signed)
Valerian root for insomnia 

## 2019-12-11 NOTE — Assessment & Plan Note (Signed)
Metoprolol, Ramipril 

## 2019-12-11 NOTE — Assessment & Plan Note (Signed)
We discussed age appropriate health related issues, including available/recomended screening tests and vaccinations. We discussed a need for adhering to healthy diet and exercise. Labs were ordered to be later reviewed . All questions were answered.   

## 2019-12-29 ENCOUNTER — Ambulatory Visit: Payer: Medicare Other | Attending: Internal Medicine

## 2019-12-29 DIAGNOSIS — Z23 Encounter for immunization: Secondary | ICD-10-CM

## 2019-12-29 NOTE — Progress Notes (Signed)
   Covid-19 Vaccination Clinic  Name:  Toni Mcmillan    MRN: [id number] DOB: May 02, 1931  12/29/2019  [name] was observed post Covid-19 immunization for 15 minutes without incidence. She was provided with Vaccine Information Sheet and instruction to access the V-Safe system.   [name] was instructed to call 911 with any severe reactions post vaccine: Marland Kitchen Difficulty breathing  . Swelling of your face and throat  . A fast heartbeat  . A bad rash all over your body  . Dizziness and weakness    Immunizations Administered    Name Date Dose VIS Date Route   Pfizer COVID-19 Vaccine 12/29/2019 11:53 AM 0.3 mL 10/26/2019 Intramuscular   Manufacturer: [employer], [employer]   Lot: [id number]   NDC: [address]-8

## 2020-01-13 ENCOUNTER — Other Ambulatory Visit: Payer: Self-pay | Admitting: Internal Medicine

## 2020-01-24 DIAGNOSIS — H04123 Dry eye syndrome of bilateral lacrimal glands: Secondary | ICD-10-CM | POA: Diagnosis not present

## 2020-01-24 DIAGNOSIS — H43823 Vitreomacular adhesion, bilateral: Secondary | ICD-10-CM | POA: Diagnosis not present

## 2020-01-24 DIAGNOSIS — E113213 Type 2 diabetes mellitus with mild nonproliferative diabetic retinopathy with macular edema, bilateral: Secondary | ICD-10-CM | POA: Diagnosis not present

## 2020-01-24 DIAGNOSIS — Z961 Presence of intraocular lens: Secondary | ICD-10-CM | POA: Diagnosis not present

## 2020-04-01 DIAGNOSIS — M816 Localized osteoporosis [Lequesne]: Secondary | ICD-10-CM | POA: Diagnosis not present

## 2020-04-01 DIAGNOSIS — Z01419 Encounter for gynecological examination (general) (routine) without abnormal findings: Secondary | ICD-10-CM | POA: Diagnosis not present

## 2020-04-01 DIAGNOSIS — Z1231 Encounter for screening mammogram for malignant neoplasm of breast: Secondary | ICD-10-CM | POA: Diagnosis not present

## 2020-04-01 DIAGNOSIS — M1990 Unspecified osteoarthritis, unspecified site: Secondary | ICD-10-CM | POA: Insufficient documentation

## 2020-04-01 DIAGNOSIS — Z6821 Body mass index (BMI) 21.0-21.9, adult: Secondary | ICD-10-CM | POA: Diagnosis not present

## 2020-07-08 ENCOUNTER — Ambulatory Visit (INDEPENDENT_AMBULATORY_CARE_PROVIDER_SITE_OTHER): Payer: Medicare Other

## 2020-07-08 ENCOUNTER — Other Ambulatory Visit: Payer: Self-pay

## 2020-07-08 VITALS — BP 118/70 | HR 52 | Temp 96.8°F | Resp 16 | Ht 62.0 in | Wt 116.6 lb

## 2020-07-08 DIAGNOSIS — Z0000 Encounter for general adult medical examination without abnormal findings: Secondary | ICD-10-CM | POA: Diagnosis not present

## 2020-07-08 NOTE — Patient Instructions (Signed)
Toni Mcmillan , Thank you for taking time to come for your Medicare Wellness Visit. I appreciate your ongoing commitment to your health goals. Please review the following plan we discussed and let me know if I can assist you in the future.   Screening recommendations/referrals: Colonoscopy: 05/29/2015; no repeat due to age Mammogram: never done Bone Density: never done Recommended yearly ophthalmology/optometry visit for glaucoma screening and checkup Recommended yearly dental visit for hygiene and checkup  Vaccinations: Influenza vaccine: 08/20/2019 Pneumococcal vaccine: completed Tdap vaccine: 06/08/2012; due every 10 years Shingles vaccine: never done Covid-19: completed  Advanced directives: Please bring a copy of your health care power of attorney and living will to the office at your convenience.   Conditions/risks identified: Yes; Please continue to do your personal lifestyle choices by: daily care of teeth and gums, regular physical activity (goal should be 5 days a week for 30 minutes), eat a healthy diet, avoid tobacco and drug use, limiting any alcohol intake, taking a low-dose aspirin (if not allergic or have been advised by your provider otherwise) and taking vitamins and minerals as recommended by your provider. Continue doing brain stimulating activities (puzzles, reading, adult coloring books, staying active) to keep memory sharp. Continue to eat heart healthy diet (full of fruits, vegetables, whole grains, lean protein, water--limit salt, fat, and sugar intake) and increase physical activity as tolerated.  Next appointment: Please schedule your next Medicare Wellness Visit with your Nurse Health Advisor in 1 year.   Preventive Care 80 Years and Older, Female Preventive care refers to lifestyle choices and visits with your health care provider that can promote health and wellness. What does preventive care include?  A yearly physical exam. This is also called an annual well  check.  Dental exams once or twice a year.  Routine eye exams. Ask your health care provider how often you should have your eyes checked.  Personal lifestyle choices, including:  Daily care of your teeth and gums.  Regular physical activity.  Eating a healthy diet.  Avoiding tobacco and drug use.  Limiting alcohol use.  Practicing safe sex.  Taking low-dose aspirin every day.  Taking vitamin and mineral supplements as recommended by your health care provider. What happens during an annual well check? The services and screenings done by your health care provider during your annual well check will depend on your age, overall health, lifestyle risk factors, and family history of disease. Counseling  Your health care provider may ask you questions about your:  Alcohol use.  Tobacco use.  Drug use.  Emotional well-being.  Home and relationship well-being.  Sexual activity.  Eating habits.  History of falls.  Memory and ability to understand (cognition).  Work and work Astronomer.  Reproductive health. Screening  You may have the following tests or measurements:  Height, weight, and BMI.  Blood pressure.  Lipid and cholesterol levels. These may be checked every 5 years, or more frequently if you are over 69 years old.  Skin check.  Lung cancer screening. You may have this screening every year starting at age 108 if you have a 30-pack-year history of smoking and currently smoke or have quit within the past 15 years.  Fecal occult blood test (FOBT) of the stool. You may have this test every year starting at age 52.  Flexible sigmoidoscopy or colonoscopy. You may have a sigmoidoscopy every 5 years or a colonoscopy every 10 years starting at age 58.  Hepatitis C blood test.  Hepatitis B blood  test.  Sexually transmitted disease (STD) testing.  Diabetes screening. This is done by checking your blood sugar (glucose) after you have not eaten for a while  (fasting). You may have this done every 1-3 years.  Bone density scan. This is done to screen for osteoporosis. You may have this done starting at age 62.  Mammogram. This may be done every 1-2 years. Talk to your health care provider about how often you should have regular mammograms. Talk with your health care provider about your test results, treatment options, and if necessary, the need for more tests. Vaccines  Your health care provider may recommend certain vaccines, such as:  Influenza vaccine. This is recommended every year.  Tetanus, diphtheria, and acellular pertussis (Tdap, Td) vaccine. You may need a Td booster every 10 years.  Zoster vaccine. You may need this after age 77.  Pneumococcal 13-valent conjugate (PCV13) vaccine. One dose is recommended after age 67.  Pneumococcal polysaccharide (PPSV23) vaccine. One dose is recommended after age 19. Talk to your health care provider about which screenings and vaccines you need and how often you need them. This information is not intended to replace advice given to you by your health care provider. Make sure you discuss any questions you have with your health care provider. Document Released: 11/28/2015 Document Revised: 07/21/2016 Document Reviewed: 09/02/2015 Elsevier Interactive Patient Education  2017 [employer] Prevention in the Home Falls can cause injuries. They can happen to people of all ages. There are many things you can do to make your home safe and to help prevent falls. What can I do on the outside of my home?  Regularly fix the edges of walkways and driveways and fix any cracks.  Remove anything that might make you trip as you walk through a door, such as a raised step or threshold.  Trim any bushes or trees on the path to your home.  Use bright outdoor lighting.  Clear any walking paths of anything that might make someone trip, such as rocks or tools.  Regularly check to see if handrails are loose  or broken. Make sure that both sides of any steps have handrails.  Any raised decks and porches should have guardrails on the edges.  Have any leaves, snow, or ice cleared regularly.  Use sand or salt on walking paths during winter.  Clean up any spills in your garage right away. This includes oil or grease spills. What can I do in the bathroom?  Use night lights.  Install grab bars by the toilet and in the tub and shower. Do not use towel bars as grab bars.  Use non-skid mats or decals in the tub or shower.  If you need to sit down in the shower, use a plastic, non-slip stool.  Keep the floor dry. Clean up any water that spills on the floor as soon as it happens.  Remove soap buildup in the tub or shower regularly.  Attach bath mats securely with double-sided non-slip rug tape.  Do not have throw rugs and other things on the floor that can make you trip. What can I do in the bedroom?  Use night lights.  Make sure that you have a light by your bed that is easy to reach.  Do not use any sheets or blankets that are too big for your bed. They should not hang down onto the floor.  Have a firm chair that has side arms. You can use this for support while  you get dressed.  Do not have throw rugs and other things on the floor that can make you trip. What can I do in the kitchen?  Clean up any spills right away.  Avoid walking on wet floors.  Keep items that you use a lot in easy-to-reach places.  If you need to reach something above you, use a strong step stool that has a grab bar.  Keep electrical cords out of the way.  Do not use floor polish or wax that makes floors slippery. If you must use wax, use non-skid floor wax.  Do not have throw rugs and other things on the floor that can make you trip. What can I do with my stairs?  Do not leave any items on the stairs.  Make sure that there are handrails on both sides of the stairs and use them. Fix handrails that are  broken or loose. Make sure that handrails are as long as the stairways.  Check any carpeting to make sure that it is firmly attached to the stairs. Fix any carpet that is loose or worn.  Avoid having throw rugs at the top or bottom of the stairs. If you do have throw rugs, attach them to the floor with carpet tape.  Make sure that you have a light switch at the top of the stairs and the bottom of the stairs. If you do not have them, ask someone to add them for you. What else can I do to help prevent falls?  Wear shoes that:  Do not have high heels.  Have rubber bottoms.  Are comfortable and fit you well.  Are closed at the toe. Do not wear sandals.  If you use a stepladder:  Make sure that it is fully opened. Do not climb a closed stepladder.  Make sure that both sides of the stepladder are locked into place.  Ask someone to hold it for you, if possible.  Clearly mark and make sure that you can see:  Any grab bars or handrails.  First and last steps.  Where the edge of each step is.  Use tools that help you move around (mobility aids) if they are needed. These include:  Canes.  Walkers.  Scooters.  Crutches.  Turn on the lights when you go into a dark area. Replace any light bulbs as soon as they burn out.  Set up your furniture so you have a clear path. Avoid moving your furniture around.  If any of your floors are uneven, fix them.  If there are any pets around you, be aware of where they are.  Review your medicines with your doctor. Some medicines can make you feel dizzy. This can increase your chance of falling. Ask your doctor what other things that you can do to help prevent falls. This information is not intended to replace advice given to you by your health care provider. Make sure you discuss any questions you have with your health care provider. Document Released: 08/28/2009 Document Revised: 04/08/2016 Document Reviewed: 12/06/2014 Elsevier  Interactive Patient Education  2017 [employer].

## 2020-07-08 NOTE — Progress Notes (Addendum)
Subjective:   Toni Mcmillan is a 86 y.o. female who presents for Medicare Annual (Subsequent) preventive examination.  Review of Systems    No ROS.Medicare Wellness Visit Cardiac Risk Factors include: advanced age (>42men, >33 women);diabetes mellitus;family history of premature cardiovascular disease     Objective:    Today's Vitals   07/08/20 1323  BP: 118/70  Pulse: (!) 52  Resp: 16  Temp: (!) 96.8 F (36 C)  SpO2: 96%  Weight: 116 lb 9.6 oz (52.9 kg)  Height: 5\' 2"  (1.575 m)  PainSc: 0-No pain   Body mass index is 21.33 kg/m.  Advanced Directives 07/08/2020 05/29/2015  Does Patient Have a Medical Advance Directive? Yes Yes  Type of Advance Directive Living will;Healthcare Power of 05/31/2015 Power of [place];Living will  Does patient want to make changes to medical advance directive? No - Patient declined -  Copy of Healthcare Power of Attorney in Chart? No - copy requested No - copy requested    Current Medications (verified) Outpatient Encounter Medications as of 07/08/2020  Medication Sig   aspirin EC 81 MG tablet Take 81 mg by mouth at bedtime.    aspirin-acetaminophen-caffeine (EXCEDRIN MIGRAINE) 250-250-65 MG per tablet Take 1-2 tablets by mouth every 6 (six) hours as needed for headache.   Calcium Carbonate-Vit D-Min (CALCIUM 1200 PO) Take 1 tablet by mouth every morning.    Cholecalciferol 1000 UNITS tablet Take 1,000 Units by mouth every morning.    ipratropium (ATROVENT) 0.03 % nasal spray ipratropium bromide 0.03 % nasal spray   ketoconazole (NIZORAL) 2 % cream Apply 1 application topically 2 (two) times daily.   Magnesium Gluconate 550 MG TABS Take by mouth.   metoprolol tartrate (LOPRESSOR) 50 MG tablet TAKE 1 AND 1/2 TABLET BY MOUTH TWO TIMES A DAY   Multiple Vitamins-Minerals (MULTIVITAMIN,TX-MINERALS) tablet Take 1 tablet by mouth every morning.    naproxen sodium (ALEVE) 220 MG tablet Take 1 tablet (220 mg total) by mouth 2 (two) times  daily with a meal.   Omega-3 Fatty Acids (FISH OIL PO) Take 1 tablet by mouth every morning.   pantoprazole (PROTONIX) 40 MG tablet Take 1 tablet (40 mg total) by mouth daily.   Polyvinyl Alcohol-Povidone (REFRESH OP) Apply 1-2 drops to eye daily as needed (dry eyes).   ramipril (ALTACE) 2.5 MG capsule Take 1 capsule (2.5 mg total) by mouth daily.   simvastatin (ZOCOR) 40 MG tablet Take 1 tablet (40 mg total) by mouth at bedtime.   vitamin C (ASCORBIC ACID) 500 MG tablet Take 500 mg by mouth 2 (two) times daily.   Wheat Dextrin (BENEFIBER PO) Take 10 mLs by mouth daily.   zolpidem (AMBIEN) 10 MG tablet TAKE ONE TABLET BY MOUTH EVERY NIGHT AT BEDTIME AS NEEDED FOR SLEEP   No facility-administered encounter medications on file as of 07/08/2020.    Allergies (verified) Celecoxib, Ciprofloxacin, Clindamycin hcl, Erythromycin, Latex, Macrobid [nitrofurantoin monohyd macro], Meperidine, Meperidine hcl, Morphine, Tetracycline, and Triprolidine-pseudoephedrine   History: Past Medical History:  Diagnosis Date   Allergic rhinitis, cause unspecified    Diabetes mellitus without complication (HCC)    borderline"never on meds"   Disturbances of sensation of smell and taste    Dizziness    Headache(784.0)    has decreased to none in later years   Insomnia, unspecified    Other and unspecified hyperlipidemia    Personal history of other diseases of digestive system    Scarlet fever    Unspecified essential hypertension  Metoprolol decreased due to drop in BP readings.   Unspecified sinusitis (chronic)    Whooping cough, unspecified organism    Past Surgical History:  Procedure Laterality Date   ABDOMINAL HYSTERECTOMY     ANAL RECTAL MANOMETRY N/A 07/07/2015   Procedure: ANO RECTAL MANOMETRY;  Surgeon: Charolett Bumpers, MD;  Location: [facility];  Service: Endoscopy;  Laterality: N/A;   BILATERAL OOPHORECTOMY     CATARACT EXTRACTION, BILATERAL     COLONOSCOPY W/ POLYPECTOMY      COLONOSCOPY WITH PROPOFOL N/A 05/29/2015   Procedure: COLONOSCOPY WITH PROPOFOL;  Surgeon: Charolett Bumpers, MD;  Location: [facility];  Service: Endoscopy;  Laterality: N/A;   correction of hammertoe deformity     CYSTOSCOPY     w/ stenting of both ureters   HEMORRHOID SURGERY     hydrosalpinx diagnostic laparoscopy     TONSILLECTOMY  age 17   vericose vein surgery     Family History  Problem Relation Age of Onset   Cancer Mother        ?   Diabetes Mother    Other Mother        coagulopathy/ ruptured viscus   Hypertension Father    Hyperlipidemia Father        ?   Other Father        CVA   Cancer Paternal Grandmother        breast   Social History   Socioeconomic History   Marital status: Married    Spouse name: Not on file   Number of children: Not on file   Years of education: Not on file   Highest education level: Not on file  Occupational History   Not on file  Tobacco Use   Smoking status: Never Smoker   Smokeless tobacco: Never Used  Substance and Sexual Activity   Alcohol use: No    Alcohol/week: 0.0 standard drinks   Drug use: No   Sexual activity: Not on file  Other Topics Concern   Not on file  Social History Narrative   7 grand children, 4 great-grandchildren   Mostly homemaker, 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, 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   Death of step- grandson by suicide- 9-10   Social Determinants of Corporate investment banker Strain: Low Risk    Difficulty of Paying Living Expenses: Not hard at all  Food Insecurity: No Food Insecurity   Worried About Programme researcher, broadcasting/film/video in the Last Year: Never true   Barista in the Last Year: Never true  Transportation Needs: No Transportation Needs   Lack of Transportation (Medical): No   Lack of Transportation (Non-Medical): No  Physical Activity: Sufficiently Active   Days of Exercise per Week: 5 days   Minutes of Exercise per Session: 30 min  Stress: No Stress Concern Present   Feeling of  Stress : Not at all  Social Connections:    Frequency of Communication with Friends and Family: Not on file   Frequency of Social Gatherings with Friends and Family: Not on file   Attends Religious Services: Not on file   Active Member of Clubs or Organizations: Not on file   Attends Banker Meetings: Not on file   Marital Status: Not on file    Tobacco Counseling Counseling given: Not Answered   Clinical Intake:  Pre-visit preparation completed: Yes  Pain : No/denies pain Pain Score: 0-No pain     BMI - recorded: 21.33  Nutritional Status: BMI of 19-24  Normal Nutritional Risks: None Diabetes: Yes CBG done?: No Did pt. bring in CBG monitor from home?: No  How often do you need to have someone help you when you read instructions, pamphlets, or other written materials from your doctor or pharmacy?: 1 - Never What is the last grade level you completed in school?: HSG  Diabetic? yes  Interpreter Needed?: No  Information entered by :: Idriss Quackenbush N. Trevaughn Schear, LPN   Activities of Daily Living In your present state of health, do you have any difficulty performing the following activities: 07/08/2020  Hearing? N  Vision? N  Difficulty concentrating or making decisions? N  Walking or climbing stairs? N  Dressing or bathing? N  Doing errands, shopping? N  Preparing Food and eating ? N  Using the Toilet? N  In the past six months, have you accidently leaked urine? N  Do you have problems with loss of bowel control? N  Managing your Medications? N  Managing your Finances? N  Housekeeping or managing your Housekeeping? N  Some recent data might be hidden    Patient Care Team: Plotnikov, Georgina Quint, MD as PCP - General (Internal Medicine) Delfin Gant, MD (Orthopedic Surgery) Richardean Chimera, MD (Obstetrics and Gynecology) Marvis Repress, MD as Referring Physician (Ophthalmology) Vida Rigger, MD as Consulting Physician (Gastroenterology)  Indicate any recent  Medical Services you may have received from other than [facility] providers in the past year (date may be approximate).     Assessment:   This is a routine wellness examination for Iyanni.  Hearing/Vision screen No exam data present  Dietary issues and exercise activities discussed: Current Exercise Habits: Home exercise routine, Type of exercise: walking;Other - see comments (yard work, gardening, owes 5 acres of land), Time (Minutes): 30, Frequency (Times/Week): 5, Weekly Exercise (Minutes/Week): 150, Intensity: Moderate, Exercise limited by: None identified  Goals   None    Depression Screen PHQ 2/9 Scores 07/08/2020 11/28/2018 11/28/2017 11/24/2016 06/04/2015  PHQ - 2 Score 0 0 0 0 0    Fall Risk Fall Risk  07/08/2020 11/28/2018 11/28/2017 11/24/2016 06/04/2015  Falls in the past year? 0 1 No No No  Number falls in past yr: 0 0 - - -  Injury with Fall? 0 0 - - -  Risk for fall due to : No Fall Risks - - - -  Follow up Falls evaluation completed;Education provided Falls evaluation completed - - -    Any stairs in or around the home? Yes  If so, are there any without handrails? No  Home free of loose throw rugs in walkways, pet beds, electrical cords, etc? Yes  Adequate lighting in your home to reduce risk of falls? Yes   ASSISTIVE DEVICES UTILIZED TO PREVENT FALLS:  Life alert? No  Use of a cane, walker or w/c? No  Grab bars in the bathroom? Yes  Shower chair or bench in shower? Yes  Elevated toilet seat or a handicapped toilet? No   TIMED UP AND GO:  Was the test performed? No .  Length of time to ambulate 10 feet: 0 sec.   Gait steady and fast without use of assistive device  Cognitive Function:     6CIT Screen 07/08/2020  What Year? 0 points  What month? 0 points  What time? 0 points  Count back from 20 0 points  Months in reverse 0 points  Repeat phrase 0 points  Total Score 0    Immunizations Immunization History  Administered Date(s) Administered   Fluad  Quad(high Dose 65+) 08/20/2019   Influenza Split 09/29/2011, 09/07/2012   Influenza Whole 09/10/2003, 09/02/2008, 08/13/2009, 08/06/2010   Influenza, High Dose Seasonal PF 08/05/2016, 08/16/2017, 09/06/2018   Influenza,inj,Quad PF,6+ Mos 08/21/2013, 08/14/2014, 07/24/2015   PFIZER SARS-COV-2 Vaccination 12/08/2019, 12/29/2019   Pneumococcal Conjugate-13 07/11/2014   Pneumococcal Polysaccharide-23 09/10/2003, 07/16/2015   Td 06/08/2012   Zoster 05/03/2007    TDAP status: Up to date Flu Vaccine status: Up to date Pneumococcal vaccine status: Up to date Covid-19 vaccine status: Completed vaccines  Qualifies for Shingles Vaccine? Yes   Zostavax completed Yes   Shingrix Completed?: No.    Education has been provided regarding the importance of this vaccine. Patient has been advised to call insurance company to determine out of pocket expense if they have not yet received this vaccine. Advised may also receive vaccine at local pharmacy or Health Dept. Verbalized acceptance and understanding.  Screening Tests Health Maintenance  Topic Date Due   FOOT EXAM  Never done   DEXA SCAN  Never done   OPHTHALMOLOGY EXAM  09/10/2016   HEMOGLOBIN A1C  06/09/2020   INFLUENZA VACCINE  06/15/2020   TETANUS/TDAP  06/08/2022   COVID-19 Vaccine  Completed   PNA vac Low Risk Adult  Completed    Health Maintenance  Health Maintenance Due  Topic Date Due   FOOT EXAM  Never done   DEXA SCAN  Never done   OPHTHALMOLOGY EXAM  09/10/2016   HEMOGLOBIN A1C  06/09/2020   INFLUENZA VACCINE  06/15/2020    Colorectal cancer screening: No longer required.  Mammogram status: No longer required.    Lung Cancer Screening: (Low Dose CT Chest recommended if Age 46-80 years, 30 pack-year currently smoking OR have quit w/in 15years.) does not qualify.   Lung Cancer Screening Referral: no  Additional Screening:  Hepatitis C Screening: does not qualify; Completed no  Vision Screening: Recommended annual  ophthalmology exams for early detection of glaucoma and other disorders of the eye. Is the patient up to date with their annual eye exam?  Yes  Who is the provider or what is the name of the office in which the patient attends annual eye exams? [facility] If pt is not established with a provider, would they like to be referred to a provider to establish care? No .   Dental Screening: Recommended annual dental exams for proper oral hygiene  Community Resource Referral / Chronic Care Management: CRR required this visit?  No   CCM required this visit?  No      Plan:     I have personally reviewed and noted the following in the patient's chart:   Medical and social history Use of alcohol, tobacco or illicit drugs  Current medications and supplements Functional ability and status Nutritional status Physical activity Advanced directives List of other physicians Hospitalizations, surgeries, and ER visits in previous 12 months Vitals Screenings to include cognitive, depression, and falls Referrals and appointments  In addition, I have reviewed and discussed with patient certain preventive protocols, quality metrics, and best practice recommendations. A written personalized care plan for preventive services as well as general preventive health recommendations were provided to patient.     Mickeal NeedyShenika N Kiron Osmun, LPN   [id number]/[id number]/2021   Nurse Notes: n/a  Medical screening examination/treatment/procedure(s) were performed by non-physician practitioner and as supervising physician I was immediately available for consultation/collaboration.  I agree with above. Jacinta ShoeAleksei Plotnikov, MD

## 2020-07-25 ENCOUNTER — Other Ambulatory Visit: Payer: Self-pay

## 2020-07-25 ENCOUNTER — Encounter: Payer: Self-pay | Admitting: Internal Medicine

## 2020-07-25 ENCOUNTER — Ambulatory Visit: Payer: Medicare Other | Admitting: Internal Medicine

## 2020-07-25 VITALS — BP 180/84 | HR 54 | Temp 98.3°F | Ht 62.0 in | Wt 115.0 lb

## 2020-07-25 DIAGNOSIS — R002 Palpitations: Secondary | ICD-10-CM

## 2020-07-25 DIAGNOSIS — E1140 Type 2 diabetes mellitus with diabetic neuropathy, unspecified: Secondary | ICD-10-CM | POA: Diagnosis not present

## 2020-07-25 DIAGNOSIS — R202 Paresthesia of skin: Secondary | ICD-10-CM

## 2020-07-25 DIAGNOSIS — I10 Essential (primary) hypertension: Secondary | ICD-10-CM

## 2020-07-25 MED ORDER — TRAZODONE HCL 50 MG PO TABS: 25.0000 mg | ORAL_TABLET | Freq: Every evening | ORAL | 3 refills | Status: DC | PRN

## 2020-07-25 NOTE — Assessment & Plan Note (Signed)
Labs

## 2020-07-25 NOTE — Progress Notes (Signed)
Subjective:  Patient ID: Toni Mcmillan, female    DOB: 04-10-1931  Age: 86 y.o. MRN: [id number]  CC: No chief complaint on file.   HPI Toni Mcmillan presents for insomnia, HTN and insomnia f/u  Outpatient Medications Prior to Visit  Medication Sig Dispense Refill  . aspirin EC 81 MG tablet Take 81 mg by mouth at bedtime.     Marland Kitchen aspirin-acetaminophen-caffeine (EXCEDRIN MIGRAINE) 250-250-65 MG per tablet Take 1-2 tablets by mouth every 6 (six) hours as needed for headache.    . Calcium Carbonate-Vit D-Min (CALCIUM 1200 PO) Take 1 tablet by mouth every morning.     . Cholecalciferol 1000 UNITS tablet Take 1,000 Units by mouth every morning.     Marland Kitchen ipratropium (ATROVENT) 0.03 % nasal spray ipratropium bromide 0.03 % nasal spray    . ketoconazole (NIZORAL) 2 % cream Apply 1 application topically 2 (two) times daily. 15 g 0  . Magnesium Gluconate 550 MG TABS Take by mouth.    . metoprolol tartrate (LOPRESSOR) 50 MG tablet TAKE 1 AND 1/2 TABLET BY MOUTH TWO TIMES A DAY 90 tablet 3  . Multiple Vitamins-Minerals (MULTIVITAMIN,TX-MINERALS) tablet Take 1 tablet by mouth every morning.     . naproxen sodium (ALEVE) 220 MG tablet Take 1 tablet (220 mg total) by mouth 2 (two) times daily with a meal. 20 tablet   . Omega-3 Fatty Acids (FISH OIL PO) Take 1 tablet by mouth every morning.    . pantoprazole (PROTONIX) 40 MG tablet Take 1 tablet (40 mg total) by mouth daily. 90 tablet 3  . Polyvinyl Alcohol-Povidone (REFRESH OP) Apply 1-2 drops to eye daily as needed (dry eyes).    . ramipril (ALTACE) 2.5 MG capsule Take 1 capsule (2.5 mg total) by mouth daily. 90 capsule 3  . simvastatin (ZOCOR) 40 MG tablet Take 1 tablet (40 mg total) by mouth at bedtime. 90 tablet 3  . vitamin C (ASCORBIC ACID) 500 MG tablet Take 500 mg by mouth 2 (two) times daily.    . Wheat Dextrin (BENEFIBER PO) Take 10 mLs by mouth daily.    Marland Kitchen zolpidem (AMBIEN) 10 MG tablet TAKE ONE TABLET BY MOUTH EVERY NIGHT AT BEDTIME AS  NEEDED FOR SLEEP 30 tablet 2   No facility-administered medications prior to visit.    ROS: Review of Systems  Constitutional: Negative for activity change, appetite change, chills, fatigue and unexpected weight change.  HENT: Negative for congestion, mouth sores and sinus pressure.   Eyes: Negative for visual disturbance.  Respiratory: Negative for cough and chest tightness.   Gastrointestinal: Negative for abdominal pain and nausea.  Genitourinary: Negative for difficulty urinating, frequency and vaginal pain.  Musculoskeletal: Positive for arthralgias. Negative for back pain and gait problem.  Skin: Negative for pallor and rash.  Neurological: Negative for dizziness, tremors, weakness, numbness and headaches.  Psychiatric/Behavioral: Negative for confusion and sleep disturbance.    Objective:  BP (!) 180/84 (BP Location: Right Arm, Patient Position: Sitting, Cuff Size: Normal)   Pulse (!) 54   Temp 98.3 F (36.8 C) (Oral)   Ht 5\' 2"  (1.575 m)   Wt 115 lb (52.2 kg)   SpO2 97%   BMI 21.03 kg/m   BP Readings from Last 3 Encounters:  07/25/20 (!) 180/84  07/08/20 118/70  12/11/19 136/82    Wt Readings from Last 3 Encounters:  07/25/20 115 lb (52.2 kg)  07/08/20 116 lb 9.6 oz (52.9 kg)  12/11/19 112 lb (50.8 kg)  Physical Exam Constitutional:      General: She is not in acute distress.    Appearance: She is well-developed.  HENT:     Head: Normocephalic.     Right Ear: External ear normal.     Left Ear: External ear normal.     Nose: Nose normal.  Eyes:     General:        Right eye: No discharge.        Left eye: No discharge.     Conjunctiva/sclera: Conjunctivae normal.     Pupils: Pupils are equal, round, and reactive to light.  Neck:     Thyroid: No thyromegaly.     Vascular: No JVD.     Trachea: No tracheal deviation.  Cardiovascular:     Rate and Rhythm: Normal rate and regular rhythm.     Heart sounds: Normal heart sounds.  Pulmonary:      Effort: No respiratory distress.     Breath sounds: No stridor. No wheezing.  Abdominal:     General: Bowel sounds are normal. There is no distension.     Palpations: Abdomen is soft. There is no mass.     Tenderness: There is no abdominal tenderness. There is no guarding or rebound.  Musculoskeletal:        General: No tenderness.     Cervical back: Normal range of motion and neck supple.  Lymphadenopathy:     Cervical: No cervical adenopathy.  Skin:    Findings: No erythema or rash.  Neurological:     Mental Status: She is oriented to person, place, and time.     Cranial Nerves: No cranial nerve deficit.     Motor: No abnormal muscle tone.     Coordination: Coordination normal.     Deep Tendon Reflexes: Reflexes normal.  Psychiatric:        Behavior: Behavior normal.        Thought Content: Thought content normal.        Judgment: Judgment normal.     Lab Results  Component Value Date   WBC 7.1 12/11/2019   HGB 11.7 (L) 12/11/2019   HCT 35.5 (L) 12/11/2019   PLT 310.0 12/11/2019   GLUCOSE 106 (H) 12/11/2019   CHOL 158 12/11/2019   TRIG 256.0 (H) 12/11/2019   HDL 42.50 12/11/2019   LDLDIRECT 84.0 12/11/2019   LDLCALC 73 01/11/2018   ALT 10 12/11/2019   AST 16 12/11/2019   NA 139 12/11/2019   K 3.8 12/11/2019   CL 103 12/11/2019   CREATININE 0.64 12/11/2019   BUN 18 12/11/2019   CO2 29 12/11/2019   TSH 1.39 12/11/2019   HGBA1C 6.2 12/11/2019    DG Wrist Complete Right  Result Date: 10/01/2019 CLINICAL DATA:  Right hand pain and swelling EXAM: RIGHT WRIST - COMPLETE 3+ VIEW COMPARISON:  08/27/2012 FINDINGS: Soft tissue swelling dorsal to the wrist without underlying fracture, acute erosion, or malalignment. There is a chronic cyst/ganglion at the lunate. Chronic chondrocalcinosis. Generalized osteopenia. IMPRESSION: 1. Soft tissue swelling without acute osseous finding or change from 2013. 2. Chondrocalcinosis and osteopenia. Electronically Signed   By: Marnee Spring M.D.   On: 10/01/2019 10:36   DG Hand Complete Right  Result Date: 10/01/2019 CLINICAL DATA:  Atraumatic right hand pain and swelling since yesterday EXAM: RIGHT HAND - COMPLETE 3+ VIEW COMPARISON:  Wrist radiograph 08/27/2012 FINDINGS: Mild dorsal soft tissue swelling at the wrist. Chronic chondrocalcinosis and lunate lucency/cyst. Mild lucency with chronic corticated appearance  along the ulnar aspect of the middle finger MCP joint where there is joint space narrowing, Mcmillan likely degenerative rather than inflammatory. Generalized osteopenia. No acute fracture or dislocation. IMPRESSION: 1. Soft tissue swelling dorsal to the wrist without acute osseous finding or change from 2013. 2. Chondrocalcinosis and osteopenia. Electronically Signed   By: Marnee Spring M.D.   On: 10/01/2019 10:35    Assessment & Plan:    Sonda Primes, MD

## 2020-07-25 NOTE — Patient Instructions (Addendum)
Check BP at home  You can try Lion's Mane Mushroom extract or capsules for memory

## 2020-07-25 NOTE — Assessment & Plan Note (Addendum)
Worse RTC 1 mo Metoprolol, Ramipril

## 2020-07-25 NOTE — Assessment & Plan Note (Signed)
TSH 

## 2020-09-11 ENCOUNTER — Other Ambulatory Visit: Payer: Self-pay

## 2020-09-11 ENCOUNTER — Ambulatory Visit: Payer: Medicare Other | Admitting: Internal Medicine

## 2020-09-11 ENCOUNTER — Encounter: Payer: Self-pay | Admitting: Internal Medicine

## 2020-09-11 VITALS — BP 138/72 | HR 49 | Temp 98.2°F | Wt 119.8 lb

## 2020-09-11 DIAGNOSIS — M79672 Pain in left foot: Secondary | ICD-10-CM

## 2020-09-11 DIAGNOSIS — I10 Essential (primary) hypertension: Secondary | ICD-10-CM | POA: Diagnosis not present

## 2020-09-11 DIAGNOSIS — M79671 Pain in right foot: Secondary | ICD-10-CM

## 2020-09-11 DIAGNOSIS — G629 Polyneuropathy, unspecified: Secondary | ICD-10-CM | POA: Diagnosis not present

## 2020-09-11 DIAGNOSIS — E1140 Type 2 diabetes mellitus with diabetic neuropathy, unspecified: Secondary | ICD-10-CM

## 2020-09-11 DIAGNOSIS — Z23 Encounter for immunization: Secondary | ICD-10-CM | POA: Diagnosis not present

## 2020-09-11 MED ORDER — GABAPENTIN 100 MG PO CAPS: 100.0000 mg | ORAL_CAPSULE | Freq: Every day | ORAL | 11 refills | Status: DC

## 2020-09-11 MED ORDER — TRIAMCINOLONE ACETONIDE 0.1 % EX OINT: 1.0000 "application " | TOPICAL_OINTMENT | Freq: Two times a day (BID) | CUTANEOUS | 2 refills | Status: DC | PRN

## 2020-09-11 NOTE — Assessment & Plan Note (Signed)
Gabapentin No PVD on Doppler

## 2020-09-11 NOTE — Progress Notes (Addendum)
Subjective:  Patient ID: Toni Mcmillan, female    DOB: 30-Aug-1931  Age: 86 y.o. MRN: [id number]  CC: Follow-up (4 wk F/U- Flu shot)   HPI Toni Mcmillan presents for "cold feet" numbness in B LEs F/u GERD, HTN C/o ext hemorrhoids - chronic  Outpatient Medications Prior to Visit  Medication Sig Dispense Refill  . aspirin EC 81 MG tablet Take 81 mg by mouth at bedtime.     Marland Kitchen aspirin-acetaminophen-caffeine (EXCEDRIN MIGRAINE) 250-250-65 MG per tablet Take 1-2 tablets by mouth every 6 (six) hours as needed for headache.    . Calcium Carbonate-Vit D-Min (CALCIUM 1200 PO) Take 1 tablet by mouth every morning.     . Cholecalciferol 1000 UNITS tablet Take 1,000 Units by mouth every morning.     Marland Kitchen ipratropium (ATROVENT) 0.03 % nasal spray ipratropium bromide 0.03 % nasal spray    . ketoconazole (NIZORAL) 2 % cream Apply 1 application topically 2 (two) times daily. 15 g 0  . Magnesium Gluconate 550 MG TABS Take by mouth.    . metoprolol tartrate (LOPRESSOR) 50 MG tablet TAKE 1 AND 1/2 TABLET BY MOUTH TWO TIMES A DAY 90 tablet 3  . Multiple Vitamins-Minerals (MULTIVITAMIN,TX-MINERALS) tablet Take 1 tablet by mouth every morning.     . naproxen sodium (ALEVE) 220 MG tablet Take 1 tablet (220 mg total) by mouth 2 (two) times daily with a meal. 20 tablet   . Omega-3 Fatty Acids (FISH OIL PO) Take 1 tablet by mouth every morning.    . pantoprazole (PROTONIX) 40 MG tablet Take 1 tablet (40 mg total) by mouth daily. 90 tablet 3  . Polyvinyl Alcohol-Povidone (REFRESH OP) Apply 1-2 drops to eye daily as needed (dry eyes).    . ramipril (ALTACE) 2.5 MG capsule Take 1 capsule (2.5 mg total) by mouth daily. 90 capsule 3  . simvastatin (ZOCOR) 40 MG tablet Take 1 tablet (40 mg total) by mouth at bedtime. 90 tablet 3  . traZODone (DESYREL) 50 MG tablet Take 0.5-1 tablets (25-50 mg total) by mouth at bedtime as needed for sleep. 30 tablet 3  . vitamin C (ASCORBIC ACID) 500 MG tablet Take 500 mg by mouth  2 (two) times daily.    . Wheat Dextrin (BENEFIBER PO) Take 10 mLs by mouth daily.     No facility-administered medications prior to visit.    ROS: Review of Systems  Constitutional: Negative for activity change, appetite change, chills, fatigue and unexpected weight change.  HENT: Negative for congestion, mouth sores and sinus pressure.   Eyes: Negative for visual disturbance.  Respiratory: Negative for cough and chest tightness.   Gastrointestinal: Negative for abdominal pain and nausea.  Genitourinary: Negative for difficulty urinating, frequency and vaginal pain.  Musculoskeletal: Negative for back pain and gait problem.  Skin: Negative for pallor and rash.  Neurological: Negative for dizziness, tremors, weakness, numbness and headaches.  Psychiatric/Behavioral: Positive for decreased concentration and sleep disturbance. Negative for confusion and suicidal ideas.    Objective:  BP 138/72 (BP Location: Left Arm)   Pulse (!) 49   Temp 98.2 F (36.8 C) (Oral)   Wt 119 lb 12.8 oz (54.3 kg)   SpO2 94%   BMI 21.91 kg/m   BP Readings from Last 3 Encounters:  09/11/20 138/72  07/25/20 (!) 180/84  07/08/20 118/70    Wt Readings from Last 3 Encounters:  09/11/20 119 lb 12.8 oz (54.3 kg)  07/25/20 115 lb (52.2 kg)  07/08/20 116 lb 9.6  oz (52.9 kg)    Physical Exam Constitutional:      General: She is not in acute distress.    Appearance: She is well-developed.  HENT:     Head: Normocephalic.     Right Ear: External ear normal.     Left Ear: External ear normal.     Nose: Nose normal.  Eyes:     General:        Right eye: No discharge.        Left eye: No discharge.     Conjunctiva/sclera: Conjunctivae normal.     Pupils: Pupils are equal, round, and reactive to light.  Neck:     Thyroid: No thyromegaly.     Vascular: No JVD.     Trachea: No tracheal deviation.  Cardiovascular:     Rate and Rhythm: Normal rate and regular rhythm.     Heart sounds: Normal heart  sounds.  Pulmonary:     Effort: No respiratory distress.     Breath sounds: No stridor. No wheezing.  Abdominal:     General: Bowel sounds are normal. There is no distension.     Palpations: Abdomen is soft. There is no mass.     Tenderness: There is no abdominal tenderness. There is no guarding or rebound.  Musculoskeletal:        General: No tenderness.     Cervical back: Normal range of motion and neck supple.  Lymphadenopathy:     Cervical: No cervical adenopathy.  Skin:    Findings: No erythema or rash.  Neurological:     Cranial Nerves: No cranial nerve deficit.     Motor: No abnormal muscle tone.     Coordination: Coordination normal.     Deep Tendon Reflexes: Reflexes normal.  Psychiatric:        Behavior: Behavior normal.        Thought Content: Thought content normal.        Judgment: Judgment normal.   forgetful   Lab Results  Component Value Date   WBC 7.1 12/11/2019   HGB 11.7 (L) 12/11/2019   HCT 35.5 (L) 12/11/2019   PLT 310.0 12/11/2019   GLUCOSE 106 (H) 12/11/2019   CHOL 158 12/11/2019   TRIG 256.0 (H) 12/11/2019   HDL 42.50 12/11/2019   LDLDIRECT 84.0 12/11/2019   LDLCALC 73 01/11/2018   ALT 10 12/11/2019   AST 16 12/11/2019   NA 139 12/11/2019   K 3.8 12/11/2019   CL 103 12/11/2019   CREATININE 0.64 12/11/2019   BUN 18 12/11/2019   CO2 29 12/11/2019   TSH 1.39 12/11/2019   HGBA1C 6.2 12/11/2019    DG Wrist Complete Right  Result Date: 10/01/2019 CLINICAL DATA:  Right hand pain and swelling EXAM: RIGHT WRIST - COMPLETE 3+ VIEW COMPARISON:  08/27/2012 FINDINGS: Soft tissue swelling dorsal to the wrist without underlying fracture, acute erosion, or malalignment. There is a chronic cyst/ganglion at the lunate. Chronic chondrocalcinosis. Generalized osteopenia. IMPRESSION: 1. Soft tissue swelling without acute osseous finding or change from 2013. 2. Chondrocalcinosis and osteopenia. Electronically Signed   By: Marnee Spring M.D.   On: 10/01/2019  10:36   DG Hand Complete Right  Result Date: 10/01/2019 CLINICAL DATA:  Atraumatic right hand pain and swelling since yesterday EXAM: RIGHT HAND - COMPLETE 3+ VIEW COMPARISON:  Wrist radiograph 08/27/2012 FINDINGS: Mild dorsal soft tissue swelling at the wrist. Chronic chondrocalcinosis and lunate lucency/cyst. Mild lucency with chronic corticated appearance along the ulnar aspect of the middle  finger MCP joint where there is joint space narrowing, Mcmillan likely degenerative rather than inflammatory. Generalized osteopenia. No acute fracture or dislocation. IMPRESSION: 1. Soft tissue swelling dorsal to the wrist without acute osseous finding or change from 2013. 2. Chondrocalcinosis and osteopenia. Electronically Signed   By: Marnee Spring M.D.   On: 10/01/2019 10:35    Assessment & Plan:     Follow-up: No follow-ups on file.  Sonda Primes, MD

## 2020-09-11 NOTE — Assessment & Plan Note (Signed)
Metoprolol, Ramipril

## 2020-09-11 NOTE — Patient Instructions (Addendum)
Rice bag heating pad You can try Lion's Mane Mushroom extract or capsules for memory

## 2020-09-11 NOTE — Assessment & Plan Note (Signed)
Start Gabapenin Rice bag heating pad Lion's mane

## 2020-09-11 NOTE — Assessment & Plan Note (Signed)
Diet controlled no medications. 

## 2020-09-11 NOTE — Addendum Note (Signed)
Addended by: Tresa Garter on: 09/11/2020 12:39 PM   Modules accepted: Orders

## 2020-09-19 DIAGNOSIS — K644 Residual hemorrhoidal skin tags: Secondary | ICD-10-CM | POA: Diagnosis not present

## 2020-09-19 DIAGNOSIS — K5901 Slow transit constipation: Secondary | ICD-10-CM | POA: Diagnosis not present

## 2020-09-19 DIAGNOSIS — K625 Hemorrhage of anus and rectum: Secondary | ICD-10-CM | POA: Diagnosis not present

## 2020-10-17 DIAGNOSIS — K644 Residual hemorrhoidal skin tags: Secondary | ICD-10-CM | POA: Diagnosis not present

## 2020-10-17 DIAGNOSIS — D649 Anemia, unspecified: Secondary | ICD-10-CM | POA: Diagnosis not present

## 2020-10-17 DIAGNOSIS — K5901 Slow transit constipation: Secondary | ICD-10-CM | POA: Diagnosis not present

## 2020-10-30 DIAGNOSIS — Z961 Presence of intraocular lens: Secondary | ICD-10-CM | POA: Diagnosis not present

## 2020-10-30 DIAGNOSIS — H04123 Dry eye syndrome of bilateral lacrimal glands: Secondary | ICD-10-CM | POA: Diagnosis not present

## 2020-10-30 DIAGNOSIS — E113213 Type 2 diabetes mellitus with mild nonproliferative diabetic retinopathy with macular edema, bilateral: Secondary | ICD-10-CM | POA: Diagnosis not present

## 2020-10-30 DIAGNOSIS — H43823 Vitreomacular adhesion, bilateral: Secondary | ICD-10-CM | POA: Diagnosis not present

## 2020-11-12 ENCOUNTER — Telehealth (INDEPENDENT_AMBULATORY_CARE_PROVIDER_SITE_OTHER): Payer: Medicare Other | Admitting: Nurse Practitioner

## 2020-11-12 ENCOUNTER — Encounter: Payer: Self-pay | Admitting: Nurse Practitioner

## 2020-11-12 ENCOUNTER — Telehealth: Payer: Medicare Other | Admitting: Nurse Practitioner

## 2020-11-12 VITALS — Temp 99.8°F | Ht 62.0 in | Wt 126.0 lb

## 2020-11-12 DIAGNOSIS — Z20822 Contact with and (suspected) exposure to covid-19: Secondary | ICD-10-CM

## 2020-11-12 DIAGNOSIS — J069 Acute upper respiratory infection, unspecified: Secondary | ICD-10-CM

## 2020-11-12 NOTE — Progress Notes (Signed)
Virtual Visit via Video Note  I connected with@ on 11/12/20 at  1:00 PM EST by a video enabled telemedicine application and verified that I am speaking with the correct person using two identifiers.  Location: Patient:Home Provider: Office Participants: patient, Mr. Minich and provider  I discussed the limitations of evaluation and management by telemedicine and the availability of in person appointments. I also discussed with the patient that there may be a patient responsible charge related to this service. The patient expressed understanding and agreed to proceed.  CC:Pt c/o fever and fatigue x2 days. Pt states they were exposed to COVID on Christmas Eve, Pt has not been tested for COVID.  History of Present Illness: Direct exposure to confirmed COVID case on 11/07/2020. Onset of symptoms 11/09/2020: cough, fever (99), sneezing and nasal congestion. Denies any chest pain, dizziness, palpitations, fatigue, or SOB. She is not interested in referral to monoclonal antibody infusion center due to mild symptoms at this time.   Observations/Objective: alert and oriented No respiratory distress.  Assessment and Plan: Laycie was seen today for acute visit.  Diagnoses and all orders for this visit:  Close exposure to COVID-19 virus  Viral upper respiratory tract infection   Follow Up Instructions: Maintain adequate oral hydration, rest, and small frequent meals. Do not stay in bed all day. Use coricidin for nasal and chest congestion, tylenol or ibuprofen for pain and fever. Call office if symptoms worsen of id you change your mind about referral to monoclonal antibody infusion clinic. F/up in 5days (video appt).  I discussed the assessment and treatment plan with the patient. The patient was provided an opportunity to ask questions and all were answered. The patient agreed with the plan and demonstrated an understanding of the instructions.   The patient was advised to call  back or seek an in-person evaluation if the symptoms worsen or if the condition fails to improve as anticipated.  Alysia Penna, NP

## 2020-11-12 NOTE — Patient Instructions (Addendum)
Maintain adequate oral hydration, rest, and small frequent meals. Do not stay in bed all day. Use coricidin for nasal and chest congestion, tylenol or ibuprofen for pain and fever. Call office if symptoms worsen of id you change your mind about referral to monoclonal antibody infusion clinic. F/up in 5days (video appt).  10 Things You Can Do to Manage Your COVID-19 Symptoms at Home If you have possible or confirmed COVID-19: 1. Stay home from work and school. And stay away from other public places. If you must go out, avoid using any kind of public transportation, ridesharing, or taxis. 2. Monitor your symptoms carefully. If your symptoms get worse, call your healthcare provider immediately. 3. Get rest and stay hydrated. 4. If you have a medical appointment, call the healthcare provider ahead of time and tell them that you have or may have COVID-19. 5. For medical emergencies, call 911 and notify the dispatch personnel that you have or may have COVID-19. 6. Cover your cough and sneezes with a tissue or use the inside of your elbow. 7. Wash your hands often with soap and water for at least 20 seconds or clean your hands with an alcohol-based hand sanitizer that contains at least 60% alcohol. 8. As much as possible, stay in a specific room and away from other people in your home. Also, you should use a separate bathroom, if available. If you need to be around other people in or outside of the home, wear a mask. 9. Avoid sharing personal items with other people in your household, like dishes, towels, and bedding. 10. Clean all surfaces that are touched often, like counters, tabletops, and doorknobs. Use household cleaning sprays or wipes according to the label instructions. [email or web address] 05/16/2019 This information is not intended to replace advice given to you by your health care provider. Make sure you discuss any questions you have with your health care provider. Document Revised:  10/18/2019 Document Reviewed: 10/18/2019 Elsevier Patient Education  2020 [employer].

## 2020-11-17 ENCOUNTER — Telehealth: Payer: Medicare Other | Admitting: Nurse Practitioner

## 2020-12-23 ENCOUNTER — Telehealth: Payer: Self-pay | Admitting: Internal Medicine

## 2020-12-23 DIAGNOSIS — R300 Dysuria: Secondary | ICD-10-CM

## 2020-12-23 NOTE — Telephone Encounter (Signed)
Patient called and said for the past two days she has been having burning when urinating. Her husband is scheduled to see Dr. Posey Rea on 2.9.22 and was wondering if a UA could be placed and she drop it off at her husbands appointment. Please advise

## 2020-12-24 ENCOUNTER — Other Ambulatory Visit (INDEPENDENT_AMBULATORY_CARE_PROVIDER_SITE_OTHER): Payer: Medicare Other

## 2020-12-24 ENCOUNTER — Other Ambulatory Visit: Payer: Self-pay

## 2020-12-24 DIAGNOSIS — R002 Palpitations: Secondary | ICD-10-CM

## 2020-12-24 DIAGNOSIS — R300 Dysuria: Secondary | ICD-10-CM | POA: Diagnosis not present

## 2020-12-24 DIAGNOSIS — E1140 Type 2 diabetes mellitus with diabetic neuropathy, unspecified: Secondary | ICD-10-CM

## 2020-12-24 DIAGNOSIS — R202 Paresthesia of skin: Secondary | ICD-10-CM

## 2020-12-24 LAB — URINALYSIS, ROUTINE W REFLEX MICROSCOPIC
Bilirubin Urine: NEGATIVE
Ketones, ur: NEGATIVE
Nitrite: POSITIVE — AB
Specific Gravity, Urine: 1.02 (ref 1.000–1.030)
Total Protein, Urine: 100 — AB
Urine Glucose: NEGATIVE
Urobilinogen, UA: 0.2 (ref 0.0–1.0)
pH: 7 (ref 5.0–8.0)

## 2020-12-24 NOTE — Telephone Encounter (Signed)
Okay urinalysis.  Ordered.  Thanks 

## 2020-12-24 NOTE — Addendum Note (Signed)
Addended by: Waldemar Dickens B on: 12/24/2020 02:50 PM   Modules accepted: Orders

## 2020-12-24 NOTE — Addendum Note (Signed)
Addended by: Waldemar Dickens B on: 12/24/2020 02:52 PM   Modules accepted: Orders

## 2020-12-24 NOTE — Telephone Encounter (Signed)
Notified pt/husband inform MD has ordered UA...Raechel Chute

## 2020-12-25 ENCOUNTER — Other Ambulatory Visit: Payer: Self-pay | Admitting: Internal Medicine

## 2020-12-25 MED ORDER — CEPHALEXIN 500 MG PO CAPS: 500.0000 mg | ORAL_CAPSULE | Freq: Three times a day (TID) | ORAL | 0 refills | Status: DC

## 2021-01-10 ENCOUNTER — Other Ambulatory Visit: Payer: Self-pay | Admitting: Internal Medicine

## 2021-01-26 DIAGNOSIS — N390 Urinary tract infection, site not specified: Secondary | ICD-10-CM | POA: Diagnosis not present

## 2021-01-26 DIAGNOSIS — R309 Painful micturition, unspecified: Secondary | ICD-10-CM | POA: Diagnosis not present

## 2021-01-29 ENCOUNTER — Other Ambulatory Visit: Payer: Self-pay

## 2021-01-29 ENCOUNTER — Observation Stay (HOSPITAL_COMMUNITY)
Admission: EM | Admit: 2021-01-29 | Discharge: 2021-01-30 | Disposition: A | Discharge status: Home / Self Care | Payer: Medicare Other | Attending: Internal Medicine | Admitting: Internal Medicine

## 2021-01-29 ENCOUNTER — Emergency Department (HOSPITAL_COMMUNITY): Payer: Medicare Other

## 2021-01-29 DIAGNOSIS — W19XXXA Unspecified fall, initial encounter: Secondary | ICD-10-CM

## 2021-01-29 DIAGNOSIS — I10 Essential (primary) hypertension: Secondary | ICD-10-CM | POA: Diagnosis not present

## 2021-01-29 DIAGNOSIS — R001 Bradycardia, unspecified: Secondary | ICD-10-CM | POA: Diagnosis present

## 2021-01-29 DIAGNOSIS — Z7982 Long term (current) use of aspirin: Secondary | ICD-10-CM | POA: Diagnosis not present

## 2021-01-29 DIAGNOSIS — Z79899 Other long term (current) drug therapy: Secondary | ICD-10-CM | POA: Diagnosis not present

## 2021-01-29 DIAGNOSIS — E119 Type 2 diabetes mellitus without complications: Secondary | ICD-10-CM | POA: Diagnosis not present

## 2021-01-29 DIAGNOSIS — I951 Orthostatic hypotension: Secondary | ICD-10-CM | POA: Diagnosis present

## 2021-01-29 DIAGNOSIS — R220 Localized swelling, mass and lump, head: Secondary | ICD-10-CM | POA: Diagnosis not present

## 2021-01-29 DIAGNOSIS — R55 Syncope and collapse: Principal | ICD-10-CM | POA: Diagnosis present

## 2021-01-29 DIAGNOSIS — Z20822 Contact with and (suspected) exposure to covid-19: Secondary | ICD-10-CM | POA: Diagnosis not present

## 2021-01-29 DIAGNOSIS — S0990XA Unspecified injury of head, initial encounter: Secondary | ICD-10-CM | POA: Diagnosis not present

## 2021-01-29 DIAGNOSIS — E785 Hyperlipidemia, unspecified: Secondary | ICD-10-CM | POA: Insufficient documentation

## 2021-01-29 DIAGNOSIS — R4020 Unspecified coma: Secondary | ICD-10-CM | POA: Diagnosis not present

## 2021-01-29 DIAGNOSIS — S0003XA Contusion of scalp, initial encounter: Secondary | ICD-10-CM | POA: Diagnosis not present

## 2021-01-29 DIAGNOSIS — Z91040 Latex allergy status: Secondary | ICD-10-CM | POA: Diagnosis not present

## 2021-01-29 DIAGNOSIS — N390 Urinary tract infection, site not specified: Secondary | ICD-10-CM | POA: Diagnosis not present

## 2021-01-29 DIAGNOSIS — G9389 Other specified disorders of brain: Secondary | ICD-10-CM | POA: Diagnosis not present

## 2021-01-29 DIAGNOSIS — R58 Hemorrhage, not elsewhere classified: Secondary | ICD-10-CM | POA: Diagnosis not present

## 2021-01-29 DIAGNOSIS — E1140 Type 2 diabetes mellitus with diabetic neuropathy, unspecified: Secondary | ICD-10-CM | POA: Diagnosis not present

## 2021-01-29 DIAGNOSIS — M549 Dorsalgia, unspecified: Secondary | ICD-10-CM | POA: Diagnosis not present

## 2021-01-29 DIAGNOSIS — G319 Degenerative disease of nervous system, unspecified: Secondary | ICD-10-CM | POA: Diagnosis not present

## 2021-01-29 LAB — CBC WITH DIFFERENTIAL/PLATELET
Abs Immature Granulocytes: 0.08 10*3/uL — ABNORMAL HIGH (ref 0.00–0.07)
Basophils Absolute: 0 10*3/uL (ref 0.0–0.1)
Basophils Relative: 0 %
Eosinophils Absolute: 0 10*3/uL (ref 0.0–0.5)
Eosinophils Relative: 0 %
HCT: 36.2 % (ref 36.0–46.0)
Hemoglobin: 11.9 g/dL — ABNORMAL LOW (ref 12.0–15.0)
Immature Granulocytes: 1 %
Lymphocytes Relative: 9 %
Lymphs Abs: 1.2 10*3/uL (ref 0.7–4.0)
MCH: 30.7 pg (ref 26.0–34.0)
MCHC: 32.9 g/dL (ref 30.0–36.0)
MCV: 93.3 fL (ref 80.0–100.0)
Monocytes Absolute: 0.4 10*3/uL (ref 0.1–1.0)
Monocytes Relative: 3 %
Neutro Abs: 12.4 10*3/uL — ABNORMAL HIGH (ref 1.7–7.7)
Neutrophils Relative %: 87 %
Platelets: 292 10*3/uL (ref 150–400)
RBC: 3.88 MIL/uL (ref 3.87–5.11)
RDW: 12.4 % (ref 11.5–15.5)
WBC: 14.2 10*3/uL — ABNORMAL HIGH (ref 4.0–10.5)
nRBC: 0 % (ref 0.0–0.2)

## 2021-01-29 LAB — COMPREHENSIVE METABOLIC PANEL
ALT: 14 U/L (ref 0–44)
AST: 25 U/L (ref 15–41)
Albumin: 3.9 g/dL (ref 3.5–5.0)
Alkaline Phosphatase: 52 U/L (ref 38–126)
Anion gap: 7 (ref 5–15)
BUN: 14 mg/dL (ref 8–23)
CO2: 28 mmol/L (ref 22–32)
Calcium: 9.2 mg/dL (ref 8.9–10.3)
Chloride: 102 mmol/L (ref 98–111)
Creatinine, Ser: 0.84 mg/dL (ref 0.44–1.00)
GFR, Estimated: >60 mL/min (ref >60)
Glucose, Bld: 130 mg/dL — ABNORMAL HIGH (ref 70–99)
Potassium: 4.1 mmol/L (ref 3.5–5.1)
Sodium: 137 mmol/L (ref 135–145)
Total Bilirubin: 0.7 mg/dL (ref 0.3–1.2)
Total Protein: 6.9 g/dL (ref 6.5–8.1)

## 2021-01-29 LAB — GLUCOSE, CAPILLARY: Glucose-Capillary: 111 mg/dL — ABNORMAL HIGH (ref 70–99)

## 2021-01-29 LAB — TROPONIN I (HIGH SENSITIVITY)
Troponin I (High Sensitivity): 7 ng/L (ref <18)
Troponin I (High Sensitivity): 6 ng/L (ref <18)

## 2021-01-29 LAB — CK: Total CK: 46 U/L (ref 38–234)

## 2021-01-29 LAB — SARS CORONAVIRUS 2 (TAT 6-24 HRS): SARS Coronavirus 2: NEGATIVE

## 2021-01-29 MED ORDER — ONDANSETRON HCL 4 MG/2ML IJ SOLN: 4.0000 mg | Freq: Four times a day (QID) | INTRAMUSCULAR | Status: DC | PRN

## 2021-01-29 MED ORDER — ASPIRIN EC 81 MG PO TBEC: 81.0000 mg | DELAYED_RELEASE_TABLET | Freq: Every day | ORAL | Status: DC

## 2021-01-29 MED ORDER — ENOXAPARIN SODIUM 40 MG/0.4ML ~~LOC~~ SOLN
40.0000 mg | SUBCUTANEOUS | Status: DC
  Administered 2021-01-29: 40 mg via SUBCUTANEOUS
  Filled 2021-01-29: qty 0.4

## 2021-01-29 MED ORDER — ONE-A-DAY WOMENS 50+ PO TABS: 1.0000 | ORAL_TABLET | Freq: Every day | ORAL | Status: DC

## 2021-01-29 MED ORDER — SODIUM CHLORIDE 0.9% FLUSH
3.0000 mL | Freq: Two times a day (BID) | INTRAVENOUS | Status: DC
  Administered 2021-01-29 – 2021-01-30 (×2): 3 mL via INTRAVENOUS

## 2021-01-29 MED ORDER — METOPROLOL TARTRATE 25 MG PO TABS
75.0000 mg | ORAL_TABLET | Freq: Two times a day (BID) | ORAL | Status: DC
  Filled 2021-01-29: qty 3

## 2021-01-29 MED ORDER — ACETAMINOPHEN 650 MG RE SUPP: 650.0000 mg | Freq: Four times a day (QID) | RECTAL | Status: DC | PRN

## 2021-01-29 MED ORDER — ACETAMINOPHEN 325 MG PO TABS
650.0000 mg | ORAL_TABLET | Freq: Four times a day (QID) | ORAL | Status: DC | PRN
  Administered 2021-01-29: 650 mg via ORAL
  Filled 2021-01-29: qty 2

## 2021-01-29 MED ORDER — ASPIRIN-ACETAMINOPHEN-CAFFEINE 250-250-65 MG PO TABS
1.0000 | ORAL_TABLET | Freq: Four times a day (QID) | ORAL | Status: DC | PRN
  Filled 2021-01-29: qty 2

## 2021-01-29 MED ORDER — NITROFURANTOIN MONOHYD MACRO 100 MG PO CAPS
100.0000 mg | ORAL_CAPSULE | Freq: Two times a day (BID) | ORAL | Status: DC
  Administered 2021-01-29 – 2021-01-30 (×2): 100 mg via ORAL
  Filled 2021-01-29 (×3): qty 1

## 2021-01-29 MED ORDER — POLYVINYL ALCOHOL 1.4 % OP SOLN
1.0000 [drp] | Freq: Every day | OPHTHALMIC | Status: DC | PRN
  Filled 2021-01-29: qty 15

## 2021-01-29 MED ORDER — POLYVINYL ALCOHOL-POVIDONE PF 1.4-0.6 % OP SOLN: Freq: Every day | OPHTHALMIC | Status: DC | PRN

## 2021-01-29 MED ORDER — SIMVASTATIN 20 MG PO TABS
40.0000 mg | ORAL_TABLET | Freq: Every day | ORAL | Status: DC
Start: 2021-01-29 — End: 2021-01-30
  Administered 2021-01-29: 40 mg via ORAL
  Filled 2021-01-29: qty 2

## 2021-01-29 MED ORDER — PANTOPRAZOLE SODIUM 40 MG PO TBEC: 40.0000 mg | DELAYED_RELEASE_TABLET | Freq: Every day | ORAL | Status: DC | PRN

## 2021-01-29 MED ORDER — ONDANSETRON HCL 4 MG PO TABS: 4.0000 mg | ORAL_TABLET | Freq: Four times a day (QID) | ORAL | Status: DC | PRN

## 2021-01-29 MED ORDER — IBUPROFEN 200 MG PO TABS: 400.0000 mg | ORAL_TABLET | Freq: Four times a day (QID) | ORAL | Status: DC | PRN

## 2021-01-29 MED ORDER — RAMIPRIL 2.5 MG PO CAPS
2.5000 mg | ORAL_CAPSULE | Freq: Every day | ORAL | Status: DC
  Administered 2021-01-30: 2.5 mg via ORAL
  Filled 2021-01-29: qty 1

## 2021-01-29 MED ORDER — ADULT MULTIVITAMIN W/MINERALS CH
1.0000 | ORAL_TABLET | Freq: Every day | ORAL | Status: DC
  Administered 2021-01-30: 1 via ORAL
  Filled 2021-01-29: qty 1

## 2021-01-29 NOTE — ED Provider Notes (Signed)
[facility] EMERGENCY DEPARTMENT Provider Note   CSN: [id number] Arrival date & time: 01/29/21  1648     History Chief Complaint  Patient presents with  . Fall    Toni Mcmillan is a 87 y.o. female.  HPI Patient presents after a fall and potentially syncope.  Earlier today fell.  Unsure what happened.  Has laceration to back of head.  Thinks she may have had her foot get caught but really cannot remember what happened.  Laid on the ground for around 3 hours to states whenever she tried to get up she felt too dizzy.  No chest pain.  No injury from the fall.  States she passed out when she was pregnant years ago but is not pressed out since.  Has been a little more fatigued last few days.  No chest pain.  No dysuria.  Not on anticoagulation.    Past Medical History:  Diagnosis Date  . Allergic rhinitis, cause unspecified   . Diabetes mellitus without complication (HCC)    borderline"never on meds"  . Disturbances of sensation of smell and taste   . Dizziness   . Headache(784.0)    has decreased to none in later years  . Insomnia, unspecified   . Other and unspecified hyperlipidemia   . Personal history of other diseases of digestive system   . Scarlet fever   . Unspecified essential hypertension    Metoprolol decreased due to drop in BP readings.  Marland Kitchen Unspecified sinusitis (chronic)   . Whooping cough, unspecified organism     Patient Active Problem List   Diagnosis Date Noted  . Sinus bradycardia 01/29/2021  . Peripheral neuropathy 09/11/2020  . Chronic left-sided thoracic back pain 12/11/2019  . RLQ abdominal pain 05/29/2018  . GERD (gastroesophageal reflux disease) 01/11/2018  . Dry eyes 11/24/2017  . Toe pain 08/16/2017  . Allergic conjunctivitis, bilateral 03/24/2017  . Pseudophakia of both eyes 03/24/2017  . Foot pain, bilateral 01/10/2017  . Acute cystitis 11/27/2016  . Bilateral impacted cerumen 08/16/2016  . Postural vertigo 08/16/2016   . Dyspnea on exertion 05/07/2016  . Palpitations 05/07/2016  . Dizziness 03/30/2016  . Vitreomacular traction syndrome of both eyes 03/25/2016  . Constipation 11/19/2015  . 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 Paresthesia of both feet 06/04/2015  . Abdominal bloating 04/23/2015  . Stool incontinence 04/23/2015  . Blood in stool 04/23/2015  . Well adult exam 07/11/2014  . Syncope and collapse 05/09/2014  . 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/15/2013  . Allergic conjunctivitis 03/22/2013  . Vitreomacular adhesion of both eyes 03/22/2013  . Status post intraocular lens implant 06/22/2012  . Diabetic macular edema of left eye (HCC) 09/23/2011  . Nonproliferative diabetic retinopathy (HCC) 09/23/2011  . Type 2 diabetes, controlled, with neuropathy (HCC) 12/16/2009  . Dyslipidemia 10/30/2007  . Essential hypertension 10/30/2007  . SINUSITIS, CHRONIC 10/30/2007  . ALLERGIC RHINITIS 10/30/2007  . Insomnia 10/30/2007  . ANOSMIA 10/30/2007  . DYSPEPSIA, HX OF 10/30/2007  . CATARACT EXTRACTIONS, BILATERAL, HX OF 10/30/2007    Past Surgical History:  Procedure Laterality Date  . ABDOMINAL HYSTERECTOMY    . ANAL RECTAL MANOMETRY N/A 07/07/2015   Procedure: ANO RECTAL MANOMETRY;  Surgeon: Charolett Bumpers, MD;  Location: [facility]COPY;  Service: Endoscopy;  Laterality: N/A;  . BILATERAL OOPHORECTOMY    . CATARACT EXTRACTION, BILATERAL    . COLONOSCOPY W/ POLYPECTOMY    . COLONOSCOPY WITH PROPOFOL N/A 05/29/2015  Procedure: COLONOSCOPY WITH PROPOFOL;  Surgeon: Charolett Bumpers, MD;  Location: [facility];  Service: Endoscopy;  Laterality: N/A;  . correction of hammertoe deformity    . CYSTOSCOPY     w/ stenting of both ureters  . HEMORRHOID SURGERY    . hydrosalpinx diagnostic laparoscopy    . TONSILLECTOMY  age 44  . vericose vein surgery       OB History   No obstetric history on file.     Family  History  Problem Relation Age of Onset  . Cancer Mother        ?  . Diabetes Mother   . Other Mother        coagulopathy/ ruptured viscus  . Hypertension Father   . Hyperlipidemia Father        ?  Marland Kitchen Other Father        CVA  . Cancer Paternal Grandmother        breast    Social History   Tobacco Use  . Smoking status: Never Smoker  . Smokeless tobacco: Never Used  Substance Use Topics  . Alcohol use: No    Alcohol/week: 0.0 standard drinks  . Drug use: No    Home Medications Prior to Admission medications   Medication Sig Start Date End Date Taking? Authorizing Provider  aspirin EC 81 MG tablet Take 81 mg by mouth at bedtime.   Yes [provider]  aspirin-acetaminophen-caffeine (EXCEDRIN MIGRAINE) [phone] MG per tablet Take 1-2 tablets by mouth every 6 (six) hours as needed (for headaches).   Yes [provider]  Cholecalciferol (VITAMIN D-3 PO) Take 1 capsule by mouth daily.   Yes [provider]  Magnesium Gluconate 550 MG TABS Take 550 mg by mouth daily.   Yes [provider]  metoprolol tartrate (LOPRESSOR) 50 MG tablet TAKE 1 AND 1/2 TABLET BY MOUTH TWO TIMES A DAY Patient taking differently: Take 75 mg by mouth in the morning and at bedtime. 01/14/20  Yes Plotnikov, Georgina Quint, MD  Multiple Vitamins-Minerals (ONE-A-DAY WOMENS 50+) TABS Take 1 tablet by mouth daily with breakfast.   Yes [provider]  nitrofurantoin, macrocrystal-monohydrate, (MACROBID) 100 MG capsule Take 100 mg by mouth in the morning and at bedtime. FOR 7 DAYS 01/26/21  Yes [provider]  Omega-3 Fatty Acids (FISH OIL PO) Take 1 capsule by mouth every morning.   Yes [provider]  pantoprazole (PROTONIX) 40 MG tablet Take 1 tablet (40 mg total) by mouth daily. Patient taking differently: Take 40 mg by mouth daily as needed (for heartburn). 12/11/19  Yes Plotnikov, Georgina Quint, MD  Polyvinyl Alcohol-Povidone (REFRESH OP) Place 1-2 drops  into both eyes daily as needed (dry eyes).   Yes [provider]  ramipril (ALTACE) 2.5 MG capsule TAKE ONE CAPSULE BY MOUTH DAILY 01/12/21  Yes Plotnikov, Georgina Quint, MD  simvastatin (ZOCOR) 40 MG tablet Take 1 tablet (40 mg total) by mouth at bedtime. 12/11/19  Yes Plotnikov, Georgina Quint, MD  vitamin C (ASCORBIC ACID) 500 MG tablet Take 500 mg by mouth daily.   Yes [provider]  Wheat Dextrin (BENEFIBER PO) Take by mouth See admin instructions. Mix 1 teaspoonful of powder into 4-8 ounces of water and drink once a day as needed for mild constipation   Yes [provider]    Allergies    Celecoxib, Ciprofloxacin, Cleocin [clindamycin], Clindamycin hcl, Erythromycin, Gabapentin, Meperidine, Meperidine hcl, Morphine, Morphine sulfate, Tetracycline, Triprolidine-pseudoephedrine, Zolpidem, and Latex  Review  of Systems   Review of Systems  Constitutional: Negative for appetite change.  HENT: Negative for congestion.   Respiratory: Negative for shortness of breath.   Cardiovascular: Negative for chest pain.  Gastrointestinal: Negative for abdominal pain.  Genitourinary: Negative for flank pain.  Musculoskeletal: Negative for back pain.  Skin: Positive for wound.  Neurological: Positive for dizziness.  Psychiatric/Behavioral: Negative for confusion.    Physical Exam Updated Vital Signs BP (!) 148/70 (BP Location: Right Arm)   Pulse (!) 57   Temp 97.9 F (36.6 C) (Oral)   Resp 18   Ht 5\' 2"  (1.575 m)   Wt 53.2 kg   SpO2 99%   BMI 21.45 kg/m   Physical Exam Vitals and nursing note reviewed.  HENT:     Head: Normocephalic.     Comments: Occipital laceration, approximately 1.5 cm.    Mouth/Throat:     Mouth: Mucous membranes are moist.  Eyes:     Extraocular Movements: Extraocular movements intact.  Cardiovascular:     Rate and Rhythm: Bradycardia present.  Pulmonary:     Breath sounds: Normal breath sounds.  Abdominal:     Tenderness: There is no  abdominal tenderness.  Musculoskeletal:        General: No tenderness.     Cervical back: Neck supple.  Skin:    General: Skin is warm.     Capillary Refill: Capillary refill takes less than 2 seconds.  Neurological:     Mental Status: She is alert and oriented to person, place, and time.     Comments: Finger-nose intact.  Awake and appropriate.  Moving all extremities equally.  Psychiatric:        Mood and Affect: Mood normal.     ED Results / Procedures / Treatments   Labs (all labs ordered are listed, but only abnormal results are displayed) Labs Reviewed  COMPREHENSIVE METABOLIC PANEL - Abnormal; Notable for the following components:      Result Value   Glucose, Bld 130 (*)    All other components within normal limits  CBC WITH DIFFERENTIAL/PLATELET - Abnormal; Notable for the following components:   WBC 14.2 (*)    Hemoglobin 11.9 (*)    Neutro Abs 12.4 (*)    Abs Immature Granulocytes 0.08 (*)    All other components within normal limits  GLUCOSE, CAPILLARY - Abnormal; Notable for the following components:   Glucose-Capillary 111 (*)    All other components within normal limits  SARS CORONAVIRUS 2 (TAT 6-24 HRS)  CK  URINALYSIS, ROUTINE W REFLEX MICROSCOPIC  TROPONIN I (HIGH SENSITIVITY)  TROPONIN I (HIGH SENSITIVITY)    EKG EKG Interpretation  Date/Time:  Thursday January 29 2021 17:53:57 EDT Ventricular Rate:  54 PR Interval:    QRS Duration: 100 QT Interval:  436 QTC Calculation: 414 R Axis:   -37 Text Interpretation: Sinus rhythm Atrial premature complex Left axis deviation Borderline T wave abnormalities Confirmed by Benjiman CorePickering, Dorn Hartshorne [phone]) on 01/29/2021 6:39:50 PM   Radiology CT Head Wo Contrast  Result Date: 01/29/2021 CLINICAL DATA:  Status post fall. EXAM: CT HEAD WITHOUT CONTRAST TECHNIQUE: Contiguous axial images were obtained from the base of the skull through the vertex without intravenous contrast. COMPARISON:  None. FINDINGS: Brain: There is  mild cerebral atrophy with widening of the extra-axial spaces and ventricular dilatation. There are areas of decreased attenuation within the white matter tracts of the supratentorial brain, consistent with microvascular disease changes. Vascular: No hyperdense vessel or unexpected calcification. Skull: Normal.  Negative for fracture or focal lesion. Sinuses/Orbits: No acute finding. Other: Moderate to marked severity right occipital scalp soft tissue swelling is noted. This extends superiorly toward the vertex. IMPRESSION: Right occipital scalp soft tissue swelling without evidence of an acute fracture or acute intracranial abnormality. Electronically Signed   By: Aram Candela M.D.   On: 01/29/2021 17:58   DG Chest Portable 1 View  Result Date: 01/29/2021 CLINICAL DATA:  Syncope. EXAM: PORTABLE CHEST 1 VIEW COMPARISON:  December 21, 2016. FINDINGS: The heart size and mediastinal contours are within normal limits. Both lungs are clear. No pneumothorax or pleural effusion is noted. The visualized skeletal structures are unremarkable. IMPRESSION: No active disease. Aortic Atherosclerosis (ICD10-I70.0). Electronically Signed   By: Lupita Raider M.D.   On: 01/29/2021 17:29    Procedures Procedures   Medications Ordered in ED Medications  sodium chloride flush (NS) 0.9 % injection 3 mL (has no administration in time range)  enoxaparin (LOVENOX) injection 40 mg (has no administration in time range)  acetaminophen (TYLENOL) tablet 650 mg (has no administration in time range)    Or  acetaminophen (TYLENOL) suppository 650 mg (has no administration in time range)  ondansetron (ZOFRAN) tablet 4 mg (has no administration in time range)    Or  ondansetron (ZOFRAN) injection 4 mg (has no administration in time range)  ibuprofen (ADVIL) tablet 400 mg (has no administration in time range)  ramipril (ALTACE) capsule 2.5 mg (has no administration in time range)  simvastatin (ZOCOR) tablet 40 mg (40 mg Oral  Given 01/29/21 2316)  nitrofurantoin (macrocrystal-monohydrate) (MACROBID) capsule 100 mg (100 mg Oral Given 01/29/21 2317)  metoprolol tartrate (LOPRESSOR) tablet 75 mg (has no administration in time range)  aspirin-acetaminophen-caffeine (EXCEDRIN MIGRAINE) per tablet 1-2 tablet (has no administration in time range)  aspirin EC tablet 81 mg (has no administration in time range)  pantoprazole (PROTONIX) EC tablet 40 mg (has no administration in time range)  multivitamin with minerals tablet 1 tablet (has no administration in time range)  polyvinyl alcohol (LIQUIFILM TEARS) 1.4 % ophthalmic solution 1 drop (has no administration in time range)    ED Course  I have reviewed the triage vital signs and the nursing notes.  Pertinent labs & imaging results that were available during my care of the patient were reviewed by me and considered in my medical decision making (see chart for details).    MDM Rules/Calculators/A&P                          Patient presents after syncopal episode.  Reportedly fell at home.  Unsure of events possibly tripped over shoes but sounds like she did definitely have a loss consciousness with it.  Hematoma to back of head.  Initially thought it was a laceration, however with further examination there was just a central thinning of the skin that was oozing without anything for laceration.  It was dressed to control bleeding.  No other apparent injury, head CT reassuring.  Lab work overall reassuring, but patient did have a syncopal episode.  Bradycardic.  Does not remember events so I think needs further admission the hospital for work-up of possible syncope.  Will admit to hospitalist. Final Clinical Impression(s) / ED Diagnoses Final diagnoses:  Fall, initial encounter  Syncope, unspecified syncope type  Hematoma of scalp, initial encounter    Rx / DC Orders ED Discharge Orders    None       Benjiman Core,  MD 01/29/21 2319

## 2021-01-29 NOTE — H&P (Signed)
History and Physical    Toni Mcmillan:[id number] DOB: April 17, 1931 DOA: 01/29/2021  PCP: Tresa Garter, MD  Patient coming from: Home  I have personally briefly reviewed patient's old medical records in [facility] Link  Chief Complaint: Syncope  HPI: Toni Mcmillan is a 87 y.o. female with medical history significant of chronic s. Brady, orthostatic hypotension with dysautonomia, DM2, HTN.  Pt presents to ED after fall and syncope earlier today.  Pt unsure of exactly what happened.  Thinks she was walking back in to the house.  Had syncope episode.  Lay on ground for up to 3 hours.  When ever she tried to get up was too dizzy.  Hit back of head.  No CP, no injury from fall (other than head lac).  No recent syncope.   ED Course: EKG shows S. Huston Foley.   Review of Systems: As per HPI, otherwise all review of systems negative.  Past Medical History:  Diagnosis Date  . Allergic rhinitis, cause unspecified   . Diabetes mellitus without complication (HCC)    borderline"never on meds"  . Disturbances of sensation of smell and taste   . Dizziness   . Headache(784.0)    has decreased to none in later years  . Insomnia, unspecified   . Other and unspecified hyperlipidemia   . Personal history of other diseases of digestive system   . Scarlet fever   . Unspecified essential hypertension    Metoprolol decreased due to drop in BP readings.  Marland Kitchen Unspecified sinusitis (chronic)   . Whooping cough, unspecified organism     Past Surgical History:  Procedure Laterality Date  . ABDOMINAL HYSTERECTOMY    . ANAL RECTAL MANOMETRY N/A 07/07/2015   Procedure: ANO RECTAL MANOMETRY;  Surgeon: Charolett Bumpers, MD;  Location: [facility];  Service: Endoscopy;  Laterality: N/A;  . BILATERAL OOPHORECTOMY    . CATARACT EXTRACTION, BILATERAL    . COLONOSCOPY W/ POLYPECTOMY    . COLONOSCOPY WITH PROPOFOL N/A 05/29/2015   Procedure: COLONOSCOPY WITH PROPOFOL;  Surgeon: Charolett Bumpers,  MD;  Location: [facility];  Service: Endoscopy;  Laterality: N/A;  . correction of hammertoe deformity    . CYSTOSCOPY     w/ stenting of both ureters  . HEMORRHOID SURGERY    . hydrosalpinx diagnostic laparoscopy    . TONSILLECTOMY  age 30  . vericose vein surgery       reports that she has never smoked. She has never used smokeless tobacco. She reports that she does not drink alcohol and does not use drugs.  Allergies  Allergen Reactions  . Celecoxib Other (See Comments)  . Ciprofloxacin     Wrist swelling  . Clindamycin Hcl Itching  . Erythromycin Other (See Comments)    GI upset  . Latex Other (See Comments)    unknown  . Macrobid [employer] Macro]     Joint swelling- started after changing from Cipro  . Meperidine Itching  . Meperidine Hcl Other (See Comments)    unknown  . Morphine Other (See Comments)    Itching  . Tetracycline Other (See Comments)    GI upset  . Triprolidine-Pseudoephedrine Other (See Comments)    unknown  . Zolpidem     Did not have a good sleep    Family History  Problem Relation Age of Onset  . Cancer Mother        ?  . Diabetes Mother   . Other Mother  coagulopathy/ ruptured viscus  . Hypertension Father   . Hyperlipidemia Father        ?  Marland Kitchen. Other Father        CVA  . Cancer Paternal Grandmother        breast     Prior to Admission medications   Medication Sig Start Date End Date Taking? Authorizing Provider  aspirin EC 81 MG tablet Take 81 mg by mouth at bedtime.     [provider]  aspirin-acetaminophen-caffeine (EXCEDRIN MIGRAINE) 603 380 1494250-250-65 MG per tablet Take 1-2 tablets by mouth every 6 (six) hours as needed for headache.    [provider]  Calcium Carbonate-Vit D-Min (CALCIUM 1200 PO) Take 1 tablet by mouth every morning.    [provider]  cephALEXin (KEFLEX) 500 MG capsule Take 1 capsule (500 mg total) by mouth 3 (three) times daily. 12/25/20   Plotnikov, Georgina QuintAleksei V, MD   Cholecalciferol 1000 UNITS tablet Take 1,000 Units by mouth every morning.     [provider]  gabapentin (NEURONTIN) 100 MG capsule Take 1-2 capsules (100-200 mg total) by mouth at bedtime. 09/11/20   Plotnikov, Georgina QuintAleksei V, MD  ipratropium (ATROVENT) 0.03 % nasal spray ipratropium bromide 0.03 % nasal spray    [provider]  ketoconazole (NIZORAL) 2 % cream Apply 1 application topically 2 (two) times daily. 02/17/18   Olive BassMurray, Laura Woodruff, FNP  Magnesium Gluconate 550 MG TABS Take by mouth.    [provider]  metoprolol tartrate (LOPRESSOR) 50 MG tablet TAKE 1 AND 1/2 TABLET BY MOUTH TWO TIMES A DAY 01/14/20   Plotnikov, Georgina QuintAleksei V, MD  Multiple Vitamins-Minerals (MULTIVITAMIN,TX-MINERALS) tablet Take 1 tablet by mouth every morning.    [provider]  naproxen sodium (ALEVE) 220 MG tablet Take 1 tablet (220 mg total) by mouth 2 (two) times daily with a meal. 10/01/19   Olive BassMurray, Laura Woodruff, FNP  Omega-3 Fatty Acids (FISH OIL PO) Take 1 tablet by mouth every morning.    [provider]  pantoprazole (PROTONIX) 40 MG tablet Take 1 tablet (40 mg total) by mouth daily. 12/11/19   Plotnikov, Georgina QuintAleksei V, MD  Polyvinyl Alcohol-Povidone (REFRESH OP) Apply 1-2 drops to eye daily as needed (dry eyes).    [provider]  ramipril (ALTACE) 2.5 MG capsule TAKE ONE CAPSULE BY MOUTH DAILY 01/12/21   Plotnikov, Georgina QuintAleksei V, MD  simvastatin (ZOCOR) 40 MG tablet Take 1 tablet (40 mg total) by mouth at bedtime. 12/11/19   Plotnikov, Georgina QuintAleksei V, MD  traZODone (DESYREL) 50 MG tablet Take 0.5-1 tablets (25-50 mg total) by mouth at bedtime as needed for sleep. 07/25/20   Plotnikov, Georgina QuintAleksei V, MD  triamcinolone ointment (KENALOG) 0.1 % Apply 1 application topically 2 (two) times daily as needed. Hemorrhoids, rash 09/11/20   Plotnikov, Georgina QuintAleksei V, MD  vitamin C (ASCORBIC ACID) 500 MG tablet Take 500 mg by mouth 2 (two) times daily.    [provider]  Wheat  Dextrin (BENEFIBER PO) Take 10 mLs by mouth daily.    [provider]    Physical Exam: Vitals:   01/29/21 1949 01/29/21 1950 01/29/21 1951 01/29/21 2000  BP: (!) 120/59 125/66 (!) 145/102   Pulse:  61 78 62  Resp:    17  Temp:      TempSrc:      SpO2:    97%    Constitutional: NAD, calm, comfortable Eyes: PERRL, lids and conjunctivae normal ENMT: Mucous membranes are moist. Posterior pharynx clear of  any exudate or lesions.Normal dentition.  Neck: normal, supple, no masses, no thyromegaly Respiratory: clear to auscultation bilaterally, no wheezing, no crackles. Normal respiratory effort. No accessory muscle use.  Cardiovascular: Regular rate and rhythm, no murmurs / rubs / gallops. No extremity edema. 2+ pedal pulses. No carotid bruits.  Abdomen: no tenderness, no masses palpated. No hepatosplenomegaly. Bowel sounds positive.  Musculoskeletal: no clubbing / cyanosis. No joint deformity upper and lower extremities. Good ROM, no contractures. Normal muscle tone.  Skin: no rashes, lesions, ulcers. No induration Neurologic: CN 2-12 grossly intact. Sensation intact, DTR normal. Strength 5/5 in all 4.  Psychiatric: Normal judgment and insight. Alert and oriented x 3. Normal mood.    Labs on Admission: I have personally reviewed following labs and imaging studies  CBC: Recent Labs  Lab 01/29/21 1746  WBC 14.2*  NEUTROABS 12.4*  HGB 11.9*  HCT 36.2  MCV 93.3  PLT 292   Basic Metabolic Panel: Recent Labs  Lab 01/29/21 1746  NA 137  K 4.1  CL 102  CO2 28  GLUCOSE 130*  BUN 14  CREATININE 0.84  CALCIUM 9.2   GFR: CrCl cannot be calculated (Unknown ideal weight.). Liver Function Tests: Recent Labs  Lab 01/29/21 1746  AST 25  ALT 14  ALKPHOS 52  BILITOT 0.7  PROT 6.9  ALBUMIN 3.9   No results for input(s): LIPASE, AMYLASE in the last 168 hours. No results for input(s): AMMONIA in the last 168 hours. Coagulation Profile: No results for input(s): INR,  PROTIME in the last 168 hours. Cardiac Enzymes: Recent Labs  Lab 01/29/21 1746  CKTOTAL 46   BNP (last 3 results) No results for input(s): PROBNP in the last 8760 hours. HbA1C: No results for input(s): HGBA1C in the last 72 hours. CBG: No results for input(s): GLUCAP in the last 168 hours. Lipid Profile: No results for input(s): CHOL, HDL, LDLCALC, TRIG, CHOLHDL, LDLDIRECT in the last 72 hours. Thyroid Function Tests: No results for input(s): TSH, T4TOTAL, FREET4, T3FREE, THYROIDAB in the last 72 hours. Anemia Panel: No results for input(s): VITAMINB12, FOLATE, FERRITIN, TIBC, IRON, RETICCTPCT in the last 72 hours. Urine analysis:    Component Value Date/Time   COLORURINE YELLOW 12/24/2020 1452   APPEARANCEUR Cloudy (A) 12/24/2020 1452   LABSPEC 1.020 12/24/2020 1452   PHURINE 7.0 12/24/2020 1452   GLUCOSEU NEGATIVE 12/24/2020 1452   HGBUR LARGE (A) 12/24/2020 1452   BILIRUBINUR NEGATIVE 12/24/2020 1452   BILIRUBINUR negative 09/26/2019 1058   KETONESUR NEGATIVE 12/24/2020 1452   PROTEINUR Negative 09/26/2019 1058   UROBILINOGEN 0.2 12/24/2020 1452   NITRITE POSITIVE (A) 12/24/2020 1452   LEUKOCYTESUR MODERATE (A) 12/24/2020 1452    Radiological Exams on Admission: CT Head Wo Contrast  Result Date: 01/29/2021 CLINICAL DATA:  Status post fall. EXAM: CT HEAD WITHOUT CONTRAST TECHNIQUE: Contiguous axial images were obtained from the base of the skull through the vertex without intravenous contrast. COMPARISON:  None. FINDINGS: Brain: There is mild cerebral atrophy with widening of the extra-axial spaces and ventricular dilatation. There are areas of decreased attenuation within the white matter tracts of the supratentorial brain, consistent with microvascular disease changes. Vascular: No hyperdense vessel or unexpected calcification. Skull: Normal. Negative for fracture or focal lesion. Sinuses/Orbits: No acute finding. Other: Moderate to marked severity right occipital scalp  soft tissue swelling is noted. This extends superiorly toward the vertex. IMPRESSION: Right occipital scalp soft tissue swelling without evidence of an acute fracture or acute intracranial abnormality. Electronically Signed  By: Aram Candela M.D.   On: 01/29/2021 17:58   DG Chest Portable 1 View  Result Date: 01/29/2021 CLINICAL DATA:  Syncope. EXAM: PORTABLE CHEST 1 VIEW COMPARISON:  December 21, 2016. FINDINGS: The heart size and mediastinal contours are within normal limits. Both lungs are clear. No pneumothorax or pleural effusion is noted. The visualized skeletal structures are unremarkable. IMPRESSION: No active disease. Aortic Atherosclerosis (ICD10-I70.0). Electronically Signed   By: Lupita Raider M.D.   On: 01/29/2021 17:29    EKG: Independently reviewed.  Assessment/Plan Principal Problem:   Syncope and collapse Active Problems:   Type 2 diabetes, controlled, with neuropathy (HCC)   Essential hypertension   Dysautonomia orthostatic hypotension syndrome   Sinus bradycardia    1. Syncope and collapse - 1. Syncope pathway 2. 2d echo 3. Tele monitor 2. DM2 - 1. Cont home meds when med rec completed 3. HTN - 1. Cont home meds when med rec completed  DVT prophylaxis: Lovenox Code Status: Full Family Communication: No family in room Disposition Plan: Home after syncope workup likely tomorrow Consults called: None Admission status: Place in 33    Sienna Stonehocker M. DO [facility]  How to contact the [facility] Attending or Consulting provider 7A - 7P or covering provider during after hours 7P -7A, for this patient?  1. Check the care team in [facility] and look for a) attending/consulting [facility] provider listed and b) the [facility] team listed 2. Log into [email or web address]  Amion Physician Scheduling and messaging for groups and whole hospitals  On call and physician scheduling software for group practices, residents, hospitalists and other medical providers for call, clinic,  rotation and shift schedules. OnCall Enterprise is a hospital-wide system for scheduling doctors and paging doctors on call. EasyPlot is for scientific plotting and data analysis.  [email or web address]  and use [facility] universal password to access. If you do not have the password, please contact the hospital operator.  3. Locate the [facility] provider you are looking for under [facility] and page to a number that you can be directly reached. 4. If you still have difficulty reaching the provider, please page the [facility] (Director on Call) for the Hospitalists listed on amion for assistance.  01/29/2021, 8:24 PM

## 2021-01-29 NOTE — Progress Notes (Signed)
Patient arrived to unit 3 west bed 16 from emergency department. Patient assisted to bed by nursing staff.Oriented patient to nursing unit ,call bell, and phone. Educated patient not to get out of bed without calling for help and verbalized understanding bed alarm ,yellow bracelet , yellow socks and floor matts in place.No acute distress noted at present time.

## 2021-01-29 NOTE — ED Triage Notes (Signed)
Pt came via EMS. Pt fell at home and LOC. Pt was found by family. Pt was down for approx 3 hours. Pt states she fell bc she wore the wrong shoes. Pt lives at hom with husband. Pt is axox4. Pt is brady and that is pts baseline. Pt takes a beta blocker and no blood thinners. Pt has hematoma on right side back of head. NIHSS 0

## 2021-01-29 NOTE — ED Notes (Signed)
RN attempted report x1.  

## 2021-01-30 ENCOUNTER — Observation Stay (HOSPITAL_BASED_OUTPATIENT_CLINIC_OR_DEPARTMENT_OTHER): Payer: Medicare Other

## 2021-01-30 DIAGNOSIS — R55 Syncope and collapse: Secondary | ICD-10-CM | POA: Diagnosis not present

## 2021-01-30 DIAGNOSIS — I361 Nonrheumatic tricuspid (valve) insufficiency: Secondary | ICD-10-CM | POA: Diagnosis not present

## 2021-01-30 DIAGNOSIS — I340 Nonrheumatic mitral (valve) insufficiency: Secondary | ICD-10-CM | POA: Diagnosis not present

## 2021-01-30 DIAGNOSIS — S0003XA Contusion of scalp, initial encounter: Secondary | ICD-10-CM

## 2021-01-30 DIAGNOSIS — I951 Orthostatic hypotension: Secondary | ICD-10-CM | POA: Diagnosis not present

## 2021-01-30 LAB — GLUCOSE, CAPILLARY
Glucose-Capillary: 107 mg/dL — ABNORMAL HIGH (ref 70–99)
Glucose-Capillary: 159 mg/dL — ABNORMAL HIGH (ref 70–99)

## 2021-01-30 LAB — ECHOCARDIOGRAM COMPLETE
Area-P 1/2: 2.95 cm2
Height: 62 in
S' Lateral: 3.6 cm
Weight: 1876.56 oz

## 2021-01-30 MED ORDER — SODIUM CHLORIDE 0.9 % IV BOLUS: 500.0000 mL | Freq: Once | INTRAVENOUS | Status: DC

## 2021-01-30 NOTE — Plan of Care (Signed)
Adequate for discharge.

## 2021-01-30 NOTE — Progress Notes (Deleted)
Pt fever 104.1 then 103.9 rectal. Temp in room turned down, cool rags applied and DR notified. Yellow MEWS protocol implemented.

## 2021-01-30 NOTE — Progress Notes (Signed)
  Echocardiogram 2D Echocardiogram has been performed.  Leta Jungling M 01/30/2021, 10:41 AM

## 2021-01-30 NOTE — Evaluation (Signed)
Physical Therapy Evaluation Patient Details Name: Toni Mcmillan MRN: [id number] DOB: 09-22-1931 Today's Date: 01/30/2021   History of Present Illness  Pt is an 87 y/o female admitted 3/17 secondary to possible syncopal episode. Pt with scalp laceration on R occipital region, but otherwise no acute abnormalities. PMH includes DM and HTN.  Clinical Impression  Pt admitted secondary to problem above with deficits below. Pt requiring min guard A for mobility tasks this session. No LOB noted. Educated about using RW for increased comfort and safety initially upon return home. Pt reports husband available to assist as needed. Anticipate she will progress well and will not require follow up PT at this time. Will continue to follow acutely to maximize functional mobility independence and safety.     Follow Up Recommendations No PT follow up;Supervision for mobility/OOB    Equipment Recommendations  None recommended by PT    Recommendations for Other Services       Precautions / Restrictions Precautions Precautions: Fall Restrictions Weight Bearing Restrictions: No      Mobility  Bed Mobility Overal bed mobility: Needs Assistance Bed Mobility: Supine to Sit     Supine to sit: Min assist     General bed mobility comments: Min A for trunk elevation. Increased time required.    Transfers Overall transfer level: Needs assistance Equipment used: 1 person hand held assist Transfers: Sit to/from Stand Sit to Stand: Min guard         General transfer comment: Min guard for safety. No LOB noted.  Ambulation/Gait Ambulation/Gait assistance: Min guard Gait Distance (Feet): 200 Feet Assistive device: 2 person hand held assist Gait Pattern/deviations: Step-through pattern;Decreased stride length Gait velocity: Decreased   General Gait Details: Pt requesting bilateral HHA for comfort,  and did not want to use the walker. No LOB noted throughout gait and pt asymptomatic. Educated  about using RW at home for increased comfort.  Stairs Stairs: Yes Stairs assistance: Min guard Stair Management: One rail Right;Step to pattern;Forwards Number of Stairs: 2 General stair comments: Overall steady with stair navigation. No LOB noted. Educated about using sideways technique if she needs increased support  Wheelchair Mobility    Modified Rankin (Stroke Patients Only)       Balance Overall balance assessment: Needs assistance Sitting-balance support: No upper extremity supported;Feet supported Sitting balance-Leahy Scale: Good     Standing balance support: Single extremity supported;Bilateral upper extremity supported Standing balance-Leahy Scale: Poor Standing balance comment: Able to maintain static standing without UE support                             Pertinent Vitals/Pain Pain Assessment: No/denies pain    Home Living Family/patient expects to be discharged to:: Private residence Living Arrangements: Spouse/significant other Available Help at Discharge: Family Type of Home: House Home Access: Stairs to enter Entrance Stairs-Rails: Right Entrance Stairs-Number of Steps: 4 Home Layout: Two level;Able to live on main level with bedroom/bathroom Home Equipment: Shower seat;Walker - 2 wheels      Prior Function Level of Independence: Independent               Hand Dominance        Extremity/Trunk Assessment   Upper Extremity Assessment Upper Extremity Assessment: Overall WFL for tasks assessed    Lower Extremity Assessment Lower Extremity Assessment: Generalized weakness    Cervical / Trunk Assessment Cervical / Trunk Assessment: Kyphotic  Communication   Communication: No difficulties  Cognition Arousal/Alertness: Awake/alert Behavior During Therapy: WFL for tasks assessed/performed Overall Cognitive Status: Impaired/Different from baseline Area of Impairment: Problem solving                              Problem Solving: Slow processing        General Comments General comments (skin integrity, edema, etc.): Pt's husband, daughter, and son present during session.    Exercises     Assessment/Plan    PT Assessment Patient needs continued PT services  PT Problem List Decreased activity tolerance;Decreased mobility;Decreased balance;Decreased knowledge of use of DME       PT Treatment Interventions DME instruction;Gait training;Stair training;Therapeutic activities;Functional mobility training;Therapeutic exercise;Balance training;Patient/family education    PT Goals (Current goals can be found in the Care Plan section)  Acute Rehab PT Goals Patient Stated Goal: to go home PT Goal Formulation: With patient/family Time For Goal Achievement: 02/13/21 Potential to Achieve Goals: Good    Frequency Min 3X/week   Barriers to discharge        Co-evaluation               AM-PAC PT "6 Clicks" Mobility  Outcome Measure Help needed turning from your back to your side while in a flat bed without using bedrails?: None Help needed moving from lying on your back to sitting on the side of a flat bed without using bedrails?: A Little Help needed moving to and from a bed to a chair (including a wheelchair)?: A Little Help needed standing up from a chair using your arms (e.g., wheelchair or bedside chair)?: A Little Help needed to walk in hospital room?: A Little Help needed climbing 3-5 steps with a railing? : A Little 6 Click Score: 19    End of Session Equipment Utilized During Treatment: Gait belt Activity Tolerance: Patient tolerated treatment well Patient left: in bed;with call bell/phone within reach;with bed alarm set;with family/visitor present Nurse Communication: Mobility status PT Visit Diagnosis: Other abnormalities of gait and mobility (R26.89)    Time: 7408-1448 PT Time Calculation (min) (ACUTE ONLY): 22 min   Charges:   PT Evaluation $PT Eval Low Complexity: 1  Low          Toni Mcmillan, DPT  Acute Rehabilitation Services  Pager: [phone] Office: [phone]   Toni Mcmillan 01/30/2021, 10:41 AM

## 2021-01-30 NOTE — Discharge Summary (Signed)
Physician Discharge Summary  Toni MoreGertrude L Scallon JXB:[id number] DOB: 08/10/1931 DOA: 01/29/2021  PCP: Tresa GarterPlotnikov, Aleksei V, MD  Admit date: 01/29/2021 Discharge date: 01/30/2021  Admitted From: home Disposition:  home  Recommendations for Outpatient Follow-up:  1. Follow up with PCP in 1-2 weeks  Home Health: none Equipment/Devices: none  Discharge Condition: stable CODE STATUS: Full code Diet recommendation: regular  HPI: Per admitting MD, Toni Mcmillan is a 87 y.o. female with medical history significant of chronic s. Brady, orthostatic hypotension with dysautonomia, DM2, HTN. Pt presents to ED after fall and syncope earlier today.  Pt unsure of exactly what happened.  Thinks she was walking back in to the house.  Had syncope episode.  Lay on ground for up to 3 hours.  When ever she tried to get up was too dizzy.  Hit back of head. No CP, no injury from fall (other than head lac).  No recent syncope.  Hospital Course / Discharge diagnoses: Principal problem Syncope -in discussing with the patient, she apparently kept her shoes inside the house slid and fell to the floor hitting the back of her head.  Following that, she does remember much but bits and pieces, trying to get up and reaching the telephone but was unable to do so.  In total she was down for about 3 hours.  She was brought to the hospital and admitted for observation.  Her CK was normal.  High-sensitivity troponin was negative x2.  She had mild leukocytosis which is likely reactive.  Her BMP and CBC otherwise looked unremarkable.  She underwent a CT scan of the head which showed right occipital scalp soft tissue swelling without evidence of an acute fracture or acute intracranial abnormality. She is recovered completely, able to ambulate with physical therapy several 100 feet with minimal assistance.  She has a history of chronic bradycardia, and for completeness underwent a 2D echo which was essentially unremarkable except  for grade 1 diastolic dysfunction which may be expected in her age group.  She will be discharged home in stable condition and was advised for outpatient follow-up with PCP.  Active problems Essential hypertension-resume home medications Hyperlipidemia-resume home medications Recent UTI-diagnosed as an outpatient 4 days ago, continue Macrobid as per outpatient plans Prediabetes-A1c last year 6.2, continue to monitor as an outpatient  Sepsis ruled out   Discharge Instructions   Allergies as of 01/30/2021      Reactions   Celecoxib Other (See Comments)   Reaction not recalled    Ciprofloxacin Swelling, Other (See Comments)   Caused wrist swelling   Cleocin [clindamycin] Itching   Clindamycin Hcl Itching   Erythromycin Other (See Comments)   GI upset   Gabapentin Other (See Comments)   Made the patient feel not well the next day   Meperidine Itching   Meperidine Hcl Itching   Morphine Itching   Morphine Sulfate Itching   Tetracycline Other (See Comments)   GI upset   Triprolidine-pseudoephedrine Other (See Comments)   Reaction not recalled   Zolpidem Other (See Comments)   "Did not have a good sleep"   Latex Itching, Rash      Medication List    TAKE these medications   aspirin EC 81 MG tablet Take 81 mg by mouth at bedtime.   aspirin-acetaminophen-caffeine 250-250-65 MG tablet Commonly known as: EXCEDRIN MIGRAINE Take 1-2 tablets by mouth every 6 (six) hours as needed (for headaches).   BENEFIBER PO Take by mouth See admin instructions. Mix 1 teaspoonful  of powder into 4-8 ounces of water and drink once a day as needed for mild constipation   FISH OIL PO Take 1 capsule by mouth every morning.   Magnesium Gluconate 550 MG Tabs Take 550 mg by mouth daily.   metoprolol tartrate 50 MG tablet Commonly known as: LOPRESSOR TAKE 1 AND 1/2 TABLET BY MOUTH TWO TIMES A DAY What changed:   how much to take  how to take this  when to take this  additional  instructions   nitrofurantoin (macrocrystal-monohydrate) 100 MG capsule Commonly known as: MACROBID Take 100 mg by mouth in the morning and at bedtime. FOR 7 DAYS   One-A-Day Womens 50+ Tabs Take 1 tablet by mouth daily with breakfast.   pantoprazole 40 MG tablet Commonly known as: PROTONIX Take 1 tablet (40 mg total) by mouth daily. What changed:   when to take this  reasons to take this   ramipril 2.5 MG capsule Commonly known as: ALTACE TAKE ONE CAPSULE BY MOUTH DAILY   REFRESH OP Place 1-2 drops into both eyes daily as needed (dry eyes).   simvastatin 40 MG tablet Commonly known as: ZOCOR Take 1 tablet (40 mg total) by mouth at bedtime.   vitamin C 500 MG tablet Commonly known as: ASCORBIC ACID Take 500 mg by mouth daily.   VITAMIN D-3 PO Take 1 capsule by mouth daily.       Consultations:  None   Procedures/Studies:  CT Head Wo Contrast  Result Date: 01/29/2021 CLINICAL DATA:  Status post fall. EXAM: CT HEAD WITHOUT CONTRAST TECHNIQUE: Contiguous axial images were obtained from the base of the skull through the vertex without intravenous contrast. COMPARISON:  None. FINDINGS: Brain: There is mild cerebral atrophy with widening of the extra-axial spaces and ventricular dilatation. There are areas of decreased attenuation within the white matter tracts of the supratentorial brain, consistent with microvascular disease changes. Vascular: No hyperdense vessel or unexpected calcification. Skull: Normal. Negative for fracture or focal lesion. Sinuses/Orbits: No acute finding. Other: Moderate to marked severity right occipital scalp soft tissue swelling is noted. This extends superiorly toward the vertex. IMPRESSION: Right occipital scalp soft tissue swelling without evidence of an acute fracture or acute intracranial abnormality. Electronically Signed   By: Aram Candela M.D.   On: 01/29/2021 17:58   DG Chest Portable 1 View  Result Date: 01/29/2021 CLINICAL  DATA:  Syncope. EXAM: PORTABLE CHEST 1 VIEW COMPARISON:  December 21, 2016. FINDINGS: The heart size and mediastinal contours are within normal limits. Both lungs are clear. No pneumothorax or pleural effusion is noted. The visualized skeletal structures are unremarkable. IMPRESSION: No active disease. Aortic Atherosclerosis (ICD10-I70.0). Electronically Signed   By: Lupita Raider M.D.   On: 01/29/2021 17:29   ECHOCARDIOGRAM COMPLETE  Result Date: 01/30/2021    ECHOCARDIOGRAM REPORT   Patient Name:   CHRISTOPHER HINK Date of Exam: 01/30/2021 Medical Rec #:  [id number]         Height:       62.0 in Accession #:    [id number]        Weight:       117.3 lb Date of Birth:  Mar 17, 1931          BSA:          1.524 m Patient Age:    89 years          BP:           141/70 mmHg Patient Gender: F  HR:           63 bpm. Exam Location:  Inpatient Procedure: 2D Echo, Color Doppler, Cardiac Doppler and Strain Analysis Indications:    Syncope R55  History:        Patient has prior history of Echocardiogram examinations, most                 recent 04/14/2016. Risk Factors:Dyslipidemia and Diabetes.  Sonographer:    Dondra Prader RVT Sonographer#2:  Leta Jungling RDCS Referring Phys: [phone] JARED M GARDNER IMPRESSIONS  1. Left ventricular ejection fraction, by estimation, is 55 to 60%. The left ventricle has normal function. The left ventricle has no regional wall motion abnormalities. Left ventricular diastolic parameters are consistent with Grade I diastolic dysfunction (impaired relaxation).  2. Right ventricular systolic function is normal. The right ventricular size is normal. There is normal pulmonary artery systolic pressure. The estimated right ventricular systolic pressure is 21.0 mmHg.  3. Tricuspid valve regurgitation is mild to moderate.  4. The mitral valve is grossly normal. Mild mitral valve regurgitation. No evidence of mitral stenosis.  5. The aortic valve is grossly normal. Aortic valve regurgitation  is not visualized. Aortic valve systolic gradient interrogation is suboptimal, visually valve appears to have no significant stenosis.  6. The inferior vena cava is normal in size with greater than 50% respiratory variability, suggesting right atrial pressure of 3 mmHg. FINDINGS  Left Ventricle: Left ventricular ejection fraction, by estimation, is 55 to 60%. The left ventricle has normal function. The left ventricle has no regional wall motion abnormalities. The left ventricular internal cavity size was normal in size. There is  no left ventricular hypertrophy. Left ventricular diastolic parameters are consistent with Grade I diastolic dysfunction (impaired relaxation). Right Ventricle: The right ventricular size is normal. No increase in right ventricular wall thickness. Right ventricular systolic function is normal. There is normal pulmonary artery systolic pressure. The tricuspid regurgitant velocity is 2.12 m/s, and  with an assumed right atrial pressure of 3 mmHg, the estimated right ventricular systolic pressure is 21.0 mmHg. Left Atrium: Left atrial size was normal in size. Right Atrium: Right atrial size was normal in size. Pericardium: There is no evidence of pericardial effusion. Presence of pericardial fat pad. Mitral Valve: The mitral valve is grossly normal. There is mild calcification of the mitral valve leaflet(s). Mild mitral valve regurgitation. No evidence of mitral valve stenosis. Tricuspid Valve: The tricuspid valve is normal in structure. Tricuspid valve regurgitation is mild to moderate. No evidence of tricuspid stenosis. Aortic Valve: The aortic valve is grossly normal. Aortic valve regurgitation is not visualized. Aortic valve systolic gradient interrogation is suboptimal, visually valve appears to have no significant stenosis. Pulmonic Valve: The pulmonic valve was normal in structure. Pulmonic valve regurgitation is not visualized. No evidence of pulmonic stenosis. Aorta: The aortic root  is normal in size and structure. Venous: The inferior vena cava is normal in size with greater than 50% respiratory variability, suggesting right atrial pressure of 3 mmHg. IAS/Shunts: No atrial level shunt detected by color flow Doppler.  LEFT VENTRICLE PLAX 2D LVIDd:         4.90 cm  Diastology LVIDs:         3.60 cm  LV e' medial:    5.66 cm/s LV PW:         1.00 cm  LV E/e' medial:  11.1 LV IVS:        1.00 cm  LV e' lateral:   5.55  cm/s LVOT diam:     2.20 cm  LV E/e' lateral: 11.3 LVOT Area:     3.80 cm                         2D Longitudinal Strain                         2D Strain GLS Avg:     -20.2 % LEFT ATRIUM             Index       RIGHT ATRIUM           Index LA diam:        3.70 cm 2.43 cm/m  RA Area:     12.60 cm LA Vol (A2C):   42.9 ml 28.15 ml/m RA Volume:   26.70 ml  17.52 ml/m LA Vol (A4C):   50.1 ml 32.88 ml/m LA Biplane Vol: 47.0 ml 30.85 ml/m   AORTA Ao Root diam: 3.20 cm Ao Asc diam:  3.70 cm MITRAL VALVE               TRICUSPID VALVE MV Area (PHT): 2.95 cm    TR Peak grad:   18.0 mmHg MV Decel Time: 257 msec    TR Vmax:        212.00 cm/s MV E velocity: 62.60 cm/s MV A velocity: 67.30 cm/s  SHUNTS MV E/A ratio:  0.93        Systemic Diam: 2.20 cm Weston Brass MD Electronically signed by Weston Brass MD Signature Date/Time: 01/30/2021/12:58:52 PM    Final       Subjective: - no chest pain, shortness of breath, no abdominal pain, nausea or vomiting.   Discharge Exam: BP (!) 144/73 (BP Location: Right Arm)   Pulse 61   Temp 98 F (36.7 C) (Oral)   Resp 18   Ht 5\' 2"  (1.575 m)   Wt 53.2 kg   SpO2 98%   BMI 21.45 kg/m   General: Pt is alert, awake, not in acute distress Cardiovascular: RRR, S1/S2 +, no rubs, no gallops Respiratory: CTA bilaterally, no wheezing, no rhonchi Abdominal: Soft, NT, ND, bowel sounds + Extremities: no edema, no cyanosis  The results of significant diagnostics from this hospitalization (including imaging, microbiology, ancillary and  laboratory) are listed below for reference.     Microbiology: Recent Results (from the past 240 hour(s))  SARS CORONAVIRUS 2 (TAT 6-24 HRS) Nasopharyngeal Nasopharyngeal Swab     Status: None   Collection Time: 01/29/21  5:58 PM   Specimen: Nasopharyngeal Swab  Result Value Ref Range Status   SARS Coronavirus 2 NEGATIVE NEGATIVE Final    Comment: (NOTE) SARS-CoV-2 target nucleic acids are NOT DETECTED.  The SARS-CoV-2 RNA is generally detectable in upper and lower respiratory specimens during the acute phase of infection. Negative results do not preclude SARS-CoV-2 infection, do not rule out co-infections with other pathogens, and should not be used as the sole basis for treatment or other patient management decisions. Negative results must be combined with clinical observations, patient history, and epidemiological information. The expected result is Negative.  Fact Sheet for Patients: 01/31/21  Fact Sheet for Healthcare Providers: HairSlick.no  This test is not yet approved or cleared by the quierodirigir.com FDA and  has been authorized for detection and/or diagnosis of SARS-CoV-2 by FDA under an Emergency Use Authorization (EUA). This EUA will remain  in effect (meaning  this test can be used) for the duration of the COVID-19 declaration under Se ction 564(b)(1) of the Act, 21 U.S.C. section 360bbb-3(b)(1), unless the authorization is terminated or revoked sooner.  Performed at [facility], [address], [place], [place] [address]      Labs: Basic Metabolic Panel: Recent Labs  Lab 01/29/21 1746  NA 137  K 4.1  CL 102  CO2 28  GLUCOSE 130*  BUN 14  CREATININE 0.84  CALCIUM 9.2   Liver Function Tests: Recent Labs  Lab 01/29/21 1746  AST 25  ALT 14  ALKPHOS 52  BILITOT 0.7  PROT 6.9  ALBUMIN 3.9   CBC: Recent Labs  Lab 01/29/21 1746  WBC 14.2*  NEUTROABS 12.4*  HGB 11.9*  HCT  36.2  MCV 93.3  PLT 292   CBG: Recent Labs  Lab 01/29/21 2211 01/30/21 0659 01/30/21 1203  GLUCAP 111* 107* 159*   Hgb A1c No results for input(s): HGBA1C in the last 72 hours. Lipid Profile No results for input(s): CHOL, HDL, LDLCALC, TRIG, CHOLHDL, LDLDIRECT in the last 72 hours. Thyroid function studies No results for input(s): TSH, T4TOTAL, T3FREE, THYROIDAB in the last 72 hours.  Invalid input(s): FREET3 Urinalysis    Component Value Date/Time   COLORURINE YELLOW 12/24/2020 1452   APPEARANCEUR Cloudy (A) 12/24/2020 1452   LABSPEC 1.020 12/24/2020 1452   PHURINE 7.0 12/24/2020 1452   GLUCOSEU NEGATIVE 12/24/2020 1452   HGBUR LARGE (A) 12/24/2020 1452   BILIRUBINUR NEGATIVE 12/24/2020 1452   BILIRUBINUR negative 09/26/2019 1058   KETONESUR NEGATIVE 12/24/2020 1452   PROTEINUR Negative 09/26/2019 1058   UROBILINOGEN 0.2 12/24/2020 1452   NITRITE POSITIVE (A) 12/24/2020 1452   LEUKOCYTESUR MODERATE (A) 12/24/2020 1452    FURTHER DISCHARGE INSTRUCTIONS:   Get Medicines reviewed and adjusted: Please take all your medications with you for your next visit with your Primary MD   Laboratory/radiological data: Please request your Primary MD to go over all hospital tests and procedure/radiological results at the follow up, please ask your Primary MD to get all Hospital records sent to his/her office.   In some cases, they will be blood work, cultures and biopsy results pending at the time of your discharge. Please request that your primary care M.D. goes through all the records of your hospital data and follows up on these results.   Also Note the following: If you experience worsening of your admission symptoms, develop shortness of breath, life threatening emergency, suicidal or homicidal thoughts you must seek medical attention immediately by calling 911 or calling your MD immediately  if symptoms less severe.   You must read complete instructions/literature along with  all the possible adverse reactions/side effects for all the Medicines you take and that have been prescribed to you. Take any new Medicines after you have completely understood and accpet all the possible adverse reactions/side effects.    Do not drive when taking Pain medications or sleeping medications (Benzodaizepines)   Do not take more than prescribed Pain, Sleep and Anxiety Medications. It is not advisable to combine anxiety,sleep and pain medications without talking with your primary care practitioner   Special Instructions: If you have smoked or chewed Tobacco  in the last 2 yrs please stop smoking, stop any regular Alcohol  and or any Recreational drug use.   Wear Seat belts while driving.   Please note: You were cared for by a hospitalist during your hospital stay. Once you are discharged, your primary care  physician will handle any further medical issues. Please note that NO REFILLS for any discharge medications will be authorized once you are discharged, as it is imperative that you return to your primary care physician (or establish a relationship with a primary care physician if you do not have one) for your post hospital discharge needs so that they can reassess your need for medications and monitor your lab values.  Time coordinating discharge: 40 minutes  SIGNED:  Pamella Pert, MD, PhD 01/30/2021, 1:21 PM

## 2021-01-30 NOTE — Progress Notes (Signed)
PT Cancellation Note  Patient Details Name: Toni Mcmillan MRN: [id number] DOB: 07-23-1931   Cancelled Treatment:    Reason Eval/Treat Not Completed: Patient at procedure or test/unavailable Pt currently having echo. Will follow up as schedule allows.   Farley Ly, PT, DPT  Acute Rehabilitation Services  Pager: [phone] Office: [phone]    Toni Mcmillan 01/30/2021, 10:01 AM

## 2021-02-09 ENCOUNTER — Ambulatory Visit: Payer: Medicare Other | Admitting: Internal Medicine

## 2021-02-09 ENCOUNTER — Encounter: Payer: Self-pay | Admitting: Internal Medicine

## 2021-02-09 ENCOUNTER — Other Ambulatory Visit: Payer: Self-pay

## 2021-02-09 VITALS — BP 130/80 | HR 67 | Temp 98.0°F | Resp 18 | Ht 62.0 in | Wt 117.0 lb

## 2021-02-09 DIAGNOSIS — R413 Other amnesia: Secondary | ICD-10-CM | POA: Diagnosis not present

## 2021-02-09 DIAGNOSIS — R55 Syncope and collapse: Secondary | ICD-10-CM

## 2021-02-09 LAB — CBC
HCT: 35.4 % — ABNORMAL LOW (ref 36.0–46.0)
Hemoglobin: 11.8 g/dL — ABNORMAL LOW (ref 12.0–15.0)
MCHC: 33.3 g/dL (ref 30.0–36.0)
MCV: 90.8 fl (ref 78.0–100.0)
Platelets: 382 10*3/uL (ref 150.0–400.0)
RBC: 3.9 Mil/uL (ref 3.87–5.11)
RDW: 12.7 % (ref 11.5–15.5)
WBC: 9.8 10*3/uL (ref 4.0–10.5)

## 2021-02-09 LAB — COMPREHENSIVE METABOLIC PANEL
ALT: 11 U/L (ref 0–35)
AST: 17 U/L (ref 0–37)
Albumin: 4.2 g/dL (ref 3.5–5.2)
Alkaline Phosphatase: 63 U/L (ref 39–117)
BUN: 18 mg/dL (ref 6–23)
CO2: 33 mEq/L — ABNORMAL HIGH (ref 19–32)
Calcium: 9.8 mg/dL (ref 8.4–10.5)
Chloride: 99 mEq/L (ref 96–112)
Creatinine, Ser: 0.81 mg/dL (ref 0.40–1.20)
GFR: 65.05 mL/min (ref >60.00)
Glucose, Bld: 165 mg/dL — ABNORMAL HIGH (ref 70–99)
Potassium: 4.6 mEq/L (ref 3.5–5.1)
Sodium: 138 mEq/L (ref 135–145)
Total Bilirubin: 0.2 mg/dL (ref 0.2–1.2)
Total Protein: 7.7 g/dL (ref 6.0–8.3)

## 2021-02-09 NOTE — Patient Instructions (Addendum)
We will get you in with the neurologist to check out the memory.   We will check the blood work.   Memory Compensation Strategies  1. Use "WARM" strategy.  W= write it down  A= associate it  R= repeat it  M= make a mental note  2.   You can keep a Glass blower/designer.  Use a 3-ring notebook with sections for the following: calendar, important names and phone numbers,  medications, doctors' names/phone numbers, lists/reminders, and a section to journal what you did  each day.   3.    Use a calendar to write appointments down.  4.    Write yourself a schedule for the day.  This can be placed on the calendar or in a separate section of the Memory Notebook.  Keeping a  regular schedule can help memory.  5.    Use medication organizer with sections for each day or morning/evening pills.  You may need help loading it  6.    Keep a basket, or pegboard by the door.  Place items that you need to take out with you in the basket or on the pegboard.  You may also want to  include a message board for reminders.  7.    Use sticky notes.  Place sticky notes with reminders in a place where the task is performed.  For example: " turn off the  stove" placed by the stove, "lock the door" placed on the door at eye level, " take your medications" on  the bathroom mirror or by the place where you normally take your medications.  8.    Use alarms/timers.  Use while cooking to remind yourself to check on food or as a reminder to take your medicine, or as a  reminder to make a call, or as a reminder to perform another task, etc.

## 2021-02-09 NOTE — Progress Notes (Signed)
   Subjective:   Patient ID: Toni Mcmillan, female    DOB: Apr 04, 1931, 87 y.o.   MRN: [id number]  HPI The patient is an 87 YO female coming in for hospital follow up (in for fall with LOC and had CT head and ECHO without findings, no signs of sepsis and CK was normal, checked due to being down for 3 hours with fall). She is using walker since being home. Does feel unstable. She was not discharged with home health. Was being treated for UTI around time of fall. She is using walker at home for stability and denies further falls. Does not have home alert system in case of falls. Lives with husband and he does have significant back pain problems and cannot help if she falls. She has some longstanding concerns about memory and concentration which she would like assessed as well. Denies specific examples but daughter mentions that they have noticed changes in the memory over years. Not really worse since the fall. Still with some soreness on her head at site of fall. Denies nausea or vomiting. Denies stomach pain. Denies urinary symptoms. Some episodes of dizziness after the fall with getting out of bed or turning head quickly but none today and getting rarer.   PMH, [facility], social history reviewed and updated  Review of Systems  Constitutional: Positive for activity change.  HENT: Negative.   Eyes: Negative.   Respiratory: Negative for cough, chest tightness and shortness of breath.   Cardiovascular: Negative for chest pain, palpitations and leg swelling.  Gastrointestinal: Negative for abdominal distention, abdominal pain, constipation, diarrhea, nausea and vomiting.  Musculoskeletal: Positive for gait problem.  Skin: Negative.   Neurological: Positive for dizziness and headaches. Negative for seizures, syncope, weakness and light-headedness.  Psychiatric/Behavioral: Negative.     Objective:  Physical Exam Constitutional:      Appearance: She is well-developed.  HENT:     Head: Normocephalic  and atraumatic.  Cardiovascular:     Rate and Rhythm: Normal rate and regular rhythm.  Pulmonary:     Effort: Pulmonary effort is normal. No respiratory distress.     Breath sounds: Normal breath sounds. No wheezing or rales.  Abdominal:     General: Bowel sounds are normal. There is no distension.     Palpations: Abdomen is soft.     Tenderness: There is no abdominal tenderness. There is no rebound.  Musculoskeletal:     Cervical back: Normal range of motion.  Skin:    General: Skin is warm and dry.  Neurological:     Mental Status: She is alert and oriented to person, place, and time.     Coordination: Coordination abnormal.     Comments: Walker for ambulation but gait steady but slow     Vitals:   02/09/21 1353  BP: 130/80  Pulse: 67  Resp: 18  Temp: 98 F (36.7 C)  TempSrc: Oral  SpO2: 97%  Weight: 117 lb (53.1 kg)  Height: 5\' 2"  (1.575 m)    This visit occurred during the SARS-CoV-2 public health emergency.  Safety protocols were in place, including screening questions prior to the visit, additional usage of staff PPE, and extensive cleaning of exam room while observing appropriate contact time as indicated for disinfecting solutions.   Assessment & Plan:

## 2021-02-10 DIAGNOSIS — F0280 Dementia in other diseases classified elsewhere without behavioral disturbance: Secondary | ICD-10-CM | POA: Insufficient documentation

## 2021-02-10 DIAGNOSIS — R413 Other amnesia: Secondary | ICD-10-CM | POA: Insufficient documentation

## 2021-02-10 NOTE — Assessment & Plan Note (Signed)
Referral to neurology per family request for formal evaluation. Recent head CT with some volume loss but appropriate for age. She is on statin and BP at goal. Does have diabetes which is complicated.

## 2021-02-10 NOTE — Assessment & Plan Note (Signed)
Checking CBC and CMP to evaluate elevated WBC in hospital. It is unclear if she fell then knocked herself out or passed out and then fell. Her memory of those events is not intact. CT head with some change but not inappropriate for age. She is doing well at home and denies need for neurorehab therapy at this time.

## 2021-02-15 ENCOUNTER — Other Ambulatory Visit: Payer: Self-pay | Admitting: Internal Medicine

## 2021-03-14 ENCOUNTER — Other Ambulatory Visit: Payer: Self-pay | Admitting: Internal Medicine

## 2021-03-21 ENCOUNTER — Other Ambulatory Visit: Payer: Self-pay | Admitting: Internal Medicine

## 2021-04-21 DIAGNOSIS — Z6822 Body mass index (BMI) 22.0-22.9, adult: Secondary | ICD-10-CM | POA: Diagnosis not present

## 2021-04-21 DIAGNOSIS — Z01419 Encounter for gynecological examination (general) (routine) without abnormal findings: Secondary | ICD-10-CM | POA: Diagnosis not present

## 2021-04-21 DIAGNOSIS — N390 Urinary tract infection, site not specified: Secondary | ICD-10-CM | POA: Diagnosis not present

## 2021-04-21 DIAGNOSIS — Z1231 Encounter for screening mammogram for malignant neoplasm of breast: Secondary | ICD-10-CM | POA: Diagnosis not present

## 2021-04-23 ENCOUNTER — Ambulatory Visit: Payer: Medicare Other | Admitting: Neurology

## 2021-05-11 ENCOUNTER — Encounter: Payer: Self-pay | Admitting: Internal Medicine

## 2021-05-28 DIAGNOSIS — N390 Urinary tract infection, site not specified: Secondary | ICD-10-CM | POA: Diagnosis not present

## 2021-07-18 ENCOUNTER — Other Ambulatory Visit: Payer: Self-pay | Admitting: Internal Medicine

## 2021-07-21 ENCOUNTER — Other Ambulatory Visit: Payer: Self-pay

## 2021-07-21 DIAGNOSIS — I10 Essential (primary) hypertension: Secondary | ICD-10-CM

## 2021-07-21 MED ORDER — METOPROLOL TARTRATE 50 MG PO TABS: ORAL_TABLET | ORAL | 0 refills | Status: DC

## 2021-08-12 ENCOUNTER — Ambulatory Visit: Payer: Medicare Other | Admitting: Neurology

## 2021-08-12 ENCOUNTER — Other Ambulatory Visit: Payer: Self-pay

## 2021-08-12 ENCOUNTER — Ambulatory Visit: Payer: Medicare Other | Admitting: Internal Medicine

## 2021-08-12 ENCOUNTER — Encounter: Payer: Self-pay | Admitting: Internal Medicine

## 2021-08-12 VITALS — BP 142/80 | HR 54 | Temp 98.6°F | Ht 62.0 in | Wt 118.6 lb

## 2021-08-12 DIAGNOSIS — F5101 Primary insomnia: Secondary | ICD-10-CM

## 2021-08-12 DIAGNOSIS — E1140 Type 2 diabetes mellitus with diabetic neuropathy, unspecified: Secondary | ICD-10-CM | POA: Diagnosis not present

## 2021-08-12 DIAGNOSIS — R413 Other amnesia: Secondary | ICD-10-CM | POA: Diagnosis not present

## 2021-08-12 DIAGNOSIS — Z23 Encounter for immunization: Secondary | ICD-10-CM

## 2021-08-12 DIAGNOSIS — D649 Anemia, unspecified: Secondary | ICD-10-CM | POA: Diagnosis not present

## 2021-08-12 DIAGNOSIS — I10 Essential (primary) hypertension: Secondary | ICD-10-CM

## 2021-08-12 LAB — CBC WITH DIFFERENTIAL/PLATELET
Basophils Absolute: 0.1 10*3/uL (ref 0.0–0.1)
Basophils Relative: 0.7 % (ref 0.0–3.0)
Eosinophils Absolute: 0.5 10*3/uL (ref 0.0–0.7)
Eosinophils Relative: 5.8 % — ABNORMAL HIGH (ref 0.0–5.0)
HCT: 35.5 % — ABNORMAL LOW (ref 36.0–46.0)
Hemoglobin: 11.5 g/dL — ABNORMAL LOW (ref 12.0–15.0)
Lymphocytes Relative: 31 % (ref 12.0–46.0)
Lymphs Abs: 2.5 10*3/uL (ref 0.7–4.0)
MCHC: 32.5 g/dL (ref 30.0–36.0)
MCV: 92.1 fl (ref 78.0–100.0)
Monocytes Absolute: 0.6 10*3/uL (ref 0.1–1.0)
Monocytes Relative: 7.2 % (ref 3.0–12.0)
Neutro Abs: 4.5 10*3/uL (ref 1.4–7.7)
Neutrophils Relative %: 55.3 % (ref 43.0–77.0)
Platelets: 308 10*3/uL (ref 150.0–400.0)
RBC: 3.86 Mil/uL — ABNORMAL LOW (ref 3.87–5.11)
RDW: 12.9 % (ref 11.5–15.5)
WBC: 8.1 10*3/uL (ref 4.0–10.5)

## 2021-08-12 LAB — COMPREHENSIVE METABOLIC PANEL
ALT: 11 U/L (ref 0–35)
AST: 19 U/L (ref 0–37)
Albumin: 4.4 g/dL (ref 3.5–5.2)
Alkaline Phosphatase: 65 U/L (ref 39–117)
BUN: 17 mg/dL (ref 6–23)
CO2: 30 mEq/L (ref 19–32)
Calcium: 9.9 mg/dL (ref 8.4–10.5)
Chloride: 100 mEq/L (ref 96–112)
Creatinine, Ser: 0.63 mg/dL (ref 0.40–1.20)
GFR: 79.21 mL/min (ref >60.00)
Glucose, Bld: 103 mg/dL — ABNORMAL HIGH (ref 70–99)
Potassium: 5 mEq/L (ref 3.5–5.1)
Sodium: 137 mEq/L (ref 135–145)
Total Bilirubin: 0.3 mg/dL (ref 0.2–1.2)
Total Protein: 7.7 g/dL (ref 6.0–8.3)

## 2021-08-12 LAB — HEMOGLOBIN A1C: Hgb A1c MFr Bld: 6.3 % (ref 4.6–6.5)

## 2021-08-12 LAB — TSH: TSH: 1.27 u[IU]/mL (ref 0.35–5.50)

## 2021-08-12 NOTE — Assessment & Plan Note (Signed)
Treat insomnia Try Tylenol PM, Benadryl 12.5 mg or Valerian root for insomnia

## 2021-08-12 NOTE — Progress Notes (Signed)
Subjective:  Patient ID: Toni Mcmillan, female    DOB: 1931-10-19  Age: 87 y.o. MRN: [id number]  CC: Medication Refill (Flu shot)   HPI Toni Mcmillan presents for HTN, insomnia, memory issues  Outpatient Medications Prior to Visit  Medication Sig Dispense Refill   aspirin EC 81 MG tablet Take 81 mg by mouth at bedtime.     aspirin-acetaminophen-caffeine (EXCEDRIN MIGRAINE) 250-250-65 MG per tablet Take 1-2 tablets by mouth every 6 (six) hours as needed (for headaches).     Cholecalciferol (VITAMIN D-3 PO) Take 1 capsule by mouth daily.     Magnesium Gluconate 550 MG TABS Take 550 mg by mouth daily.     metoprolol tartrate (LOPRESSOR) 50 MG tablet TAKE 1 AND 1/2 TABLET BY MOUTH TWO TIMES A DAY 270 tablet 0   Multiple Vitamins-Minerals (ONE-A-DAY WOMENS 50+) TABS Take 1 tablet by mouth daily with breakfast.     nitrofurantoin, macrocrystal-monohydrate, (MACROBID) 100 MG capsule Take 100 mg by mouth in the morning and at bedtime. FOR 7 DAYS     Omega-3 Fatty Acids (FISH OIL PO) Take 1 capsule by mouth every morning.     pantoprazole (PROTONIX) 40 MG tablet Take 1 tablet (40 mg total) by mouth daily. (Patient taking differently: Take 40 mg by mouth daily as needed (for heartburn).) 90 tablet 3   Polyvinyl Alcohol-Povidone (REFRESH OP) Place 1-2 drops into both eyes daily as needed (dry eyes).     ramipril (ALTACE) 2.5 MG capsule TAKE ONE CAPSULE BY MOUTH DAILY 90 capsule 3   simvastatin (ZOCOR) 40 MG tablet TAKE 1 TABLET (40 MG TOTAL) BY MOUTH AT BEDTIME. FOLLOW-UP APPOINTMENT IS DUE W/LIPID CHECK MUST SEE PROVIDER FOR FUTURE REFILLS 30 tablet 11   vitamin C (ASCORBIC ACID) 500 MG tablet Take 500 mg by mouth daily.     Wheat Dextrin (BENEFIBER PO) Take by mouth See admin instructions. Mix 1 teaspoonful of powder into 4-8 ounces of water and drink once a day as needed for mild constipation     No facility-administered medications prior to visit.    ROS: Review of Systems   Constitutional:  Negative for activity change, appetite change, chills, fatigue and unexpected weight change.  HENT:  Negative for congestion, mouth sores and sinus pressure.   Eyes:  Negative for visual disturbance.  Respiratory:  Negative for cough and chest tightness.   Gastrointestinal:  Negative for abdominal pain and nausea.  Genitourinary:  Negative for difficulty urinating, frequency and vaginal pain.  Musculoskeletal:  Negative for back pain and gait problem.  Skin:  Negative for pallor and rash.  Neurological:  Negative for dizziness, tremors, weakness, numbness and headaches.  Psychiatric/Behavioral:  Positive for decreased concentration and sleep disturbance. Negative for confusion and suicidal ideas. The patient is nervous/anxious.    Objective:  BP (!) 142/80 (BP Location: Left Arm)   Pulse (!) 54   Temp 98.6 F (37 C) (Oral)   Ht 5\' 2"  (1.575 m)   Wt 118 lb 9.6 oz (53.8 kg)   SpO2 96%   BMI 21.69 kg/m   BP Readings from Last 3 Encounters:  08/12/21 (!) 142/80  02/09/21 130/80  01/30/21 (!) 144/73    Wt Readings from Last 3 Encounters:  08/12/21 118 lb 9.6 oz (53.8 kg)  02/09/21 117 lb (53.1 kg)  01/29/21 117 lb 4.6 oz (53.2 kg)    Physical Exam Constitutional:      General: She is not in acute distress.    Appearance:  She is well-developed.  HENT:     Head: Normocephalic.     Right Ear: External ear normal.     Left Ear: External ear normal.     Nose: Nose normal.  Eyes:     General:        Right eye: No discharge.        Left eye: No discharge.     Conjunctiva/sclera: Conjunctivae normal.     Pupils: Pupils are equal, round, and reactive to light.  Neck:     Thyroid: No thyromegaly.     Vascular: No JVD.     Trachea: No tracheal deviation.  Cardiovascular:     Rate and Rhythm: Normal rate and regular rhythm.     Heart sounds: Normal heart sounds.  Pulmonary:     Effort: No respiratory distress.     Breath sounds: No stridor. No wheezing.   Abdominal:     General: Bowel sounds are normal. There is no distension.     Palpations: Abdomen is soft. There is no mass.     Tenderness: There is no abdominal tenderness. There is no guarding or rebound.  Musculoskeletal:        General: No tenderness.     Cervical back: Normal range of motion and neck supple. No rigidity.  Lymphadenopathy:     Cervical: No cervical adenopathy.  Skin:    Findings: No erythema or rash.  Neurological:     Cranial Nerves: No cranial nerve deficit.     Motor: No abnormal muscle tone.     Coordination: Coordination normal.     Deep Tendon Reflexes: Reflexes normal.  Psychiatric:        Behavior: Behavior normal.        Thought Content: Thought content normal.        Judgment: Judgment normal.    Lab Results  Component Value Date   WBC 9.8 02/09/2021   HGB 11.8 (L) 02/09/2021   HCT 35.4 (L) 02/09/2021   PLT 382.0 02/09/2021   GLUCOSE 165 (H) 02/09/2021   CHOL 158 12/11/2019   TRIG 256.0 (H) 12/11/2019   HDL 42.50 12/11/2019   LDLDIRECT 84.0 12/11/2019   LDLCALC 73 01/11/2018   ALT 11 02/09/2021   AST 17 02/09/2021   NA 138 02/09/2021   K 4.6 02/09/2021   CL 99 02/09/2021   CREATININE 0.81 02/09/2021   BUN 18 02/09/2021   CO2 33 (H) 02/09/2021   TSH 1.39 12/11/2019   HGBA1C 6.2 12/11/2019    CT Head Wo Contrast  Result Date: 01/29/2021 CLINICAL DATA:  Status post fall. EXAM: CT HEAD WITHOUT CONTRAST TECHNIQUE: Contiguous axial images were obtained from the base of the skull through the vertex without intravenous contrast. COMPARISON:  None. FINDINGS: Brain: There is mild cerebral atrophy with widening of the extra-axial spaces and ventricular dilatation. There are areas of decreased attenuation within the white matter tracts of the supratentorial brain, consistent with microvascular disease changes. Vascular: No hyperdense vessel or unexpected calcification. Skull: Normal. Negative for fracture or focal lesion. Sinuses/Orbits: No acute  finding. Other: Moderate to marked severity right occipital scalp soft tissue swelling is noted. This extends superiorly toward the vertex. IMPRESSION: Right occipital scalp soft tissue swelling without evidence of an acute fracture or acute intracranial abnormality. Electronically Signed   By: Aram Candela M.D.   On: 01/29/2021 17:58   DG Chest Portable 1 View  Result Date: 01/29/2021 CLINICAL DATA:  Syncope. EXAM: PORTABLE CHEST 1 VIEW COMPARISON:  December 21, 2016. FINDINGS: The heart size and mediastinal contours are within normal limits. Both lungs are clear. No pneumothorax or pleural effusion is noted. The visualized skeletal structures are unremarkable. IMPRESSION: No active disease. Aortic Atherosclerosis (ICD10-I70.0). Electronically Signed   By: Lupita Raider M.D.   On: 01/29/2021 17:29   ECHOCARDIOGRAM COMPLETE  Result Date: 01/30/2021    ECHOCARDIOGRAM REPORT   Patient Name:   Toni Mcmillan Date of Exam: 01/30/2021 Medical Rec #:  [id number]         Height:       62.0 in Accession #:    [id number]        Weight:       117.3 lb Date of Birth:  1931/10/05          BSA:          1.524 m Patient Age:    89 years          BP:           141/70 mmHg Patient Gender: F                 HR:           63 bpm. Exam Location:  Inpatient Procedure: 2D Echo, Color Doppler, Cardiac Doppler and Strain Analysis Indications:    Syncope R55  History:        Patient has prior history of Echocardiogram examinations, most                 recent 04/14/2016. Risk Factors:Dyslipidemia and Diabetes.  Sonographer:    Dondra Prader RVT Sonographer#2:  Leta Jungling RDCS Referring Phys: [phone] JARED M GARDNER IMPRESSIONS  1. Left ventricular ejection fraction, by estimation, is 55 to 60%. The left ventricle has normal function. The left ventricle has no regional wall motion abnormalities. Left ventricular diastolic parameters are consistent with Grade I diastolic dysfunction (impaired relaxation).  2. Right ventricular  systolic function is normal. The right ventricular size is normal. There is normal pulmonary artery systolic pressure. The estimated right ventricular systolic pressure is 21.0 mmHg.  3. Tricuspid valve regurgitation is mild to moderate.  4. The mitral valve is grossly normal. Mild mitral valve regurgitation. No evidence of mitral stenosis.  5. The aortic valve is grossly normal. Aortic valve regurgitation is not visualized. Aortic valve systolic gradient interrogation is suboptimal, visually valve appears to have no significant stenosis.  6. The inferior vena cava is normal in size with greater than 50% respiratory variability, suggesting right atrial pressure of 3 mmHg. FINDINGS  Left Ventricle: Left ventricular ejection fraction, by estimation, is 55 to 60%. The left ventricle has normal function. The left ventricle has no regional wall motion abnormalities. The left ventricular internal cavity size was normal in size. There is  no left ventricular hypertrophy. Left ventricular diastolic parameters are consistent with Grade I diastolic dysfunction (impaired relaxation). Right Ventricle: The right ventricular size is normal. No increase in right ventricular wall thickness. Right ventricular systolic function is normal. There is normal pulmonary artery systolic pressure. The tricuspid regurgitant velocity is 2.12 m/s, and  with an assumed right atrial pressure of 3 mmHg, the estimated right ventricular systolic pressure is 21.0 mmHg. Left Atrium: Left atrial size was normal in size. Right Atrium: Right atrial size was normal in size. Pericardium: There is no evidence of pericardial effusion. Presence of pericardial fat pad. Mitral Valve: The mitral valve is grossly normal. There is mild calcification of the mitral valve leaflet(s). Mild  mitral valve regurgitation. No evidence of mitral valve stenosis. Tricuspid Valve: The tricuspid valve is normal in structure. Tricuspid valve regurgitation is mild to moderate. No  evidence of tricuspid stenosis. Aortic Valve: The aortic valve is grossly normal. Aortic valve regurgitation is not visualized. Aortic valve systolic gradient interrogation is suboptimal, visually valve appears to have no significant stenosis. Pulmonic Valve: The pulmonic valve was normal in structure. Pulmonic valve regurgitation is not visualized. No evidence of pulmonic stenosis. Aorta: The aortic root is normal in size and structure. Venous: The inferior vena cava is normal in size with greater than 50% respiratory variability, suggesting right atrial pressure of 3 mmHg. IAS/Shunts: No atrial level shunt detected by color flow Doppler.  LEFT VENTRICLE PLAX 2D LVIDd:         4.90 cm  Diastology LVIDs:         3.60 cm  LV e' medial:    5.66 cm/s LV PW:         1.00 cm  LV E/e' medial:  11.1 LV IVS:        1.00 cm  LV e' lateral:   5.55 cm/s LVOT diam:     2.20 cm  LV E/e' lateral: 11.3 LVOT Area:     3.80 cm                         2D Longitudinal Strain                         2D Strain GLS Avg:     -20.2 % LEFT ATRIUM             Index       RIGHT ATRIUM           Index LA diam:        3.70 cm 2.43 cm/m  RA Area:     12.60 cm LA Vol (A2C):   42.9 ml 28.15 ml/m RA Volume:   26.70 ml  17.52 ml/m LA Vol (A4C):   50.1 ml 32.88 ml/m LA Biplane Vol: 47.0 ml 30.85 ml/m   AORTA Ao Root diam: 3.20 cm Ao Asc diam:  3.70 cm MITRAL VALVE               TRICUSPID VALVE MV Area (PHT): 2.95 cm    TR Peak grad:   18.0 mmHg MV Decel Time: 257 msec    TR Vmax:        212.00 cm/s MV E velocity: 62.60 cm/s MV A velocity: 67.30 cm/s  SHUNTS MV E/A ratio:  0.93        Systemic Diam: 2.20 cm Weston Brass MD Electronically signed by Weston Brass MD Signature Date/Time: 01/30/2021/12:58:52 PM    Final     Assessment & Plan:   Problem List Items Addressed This Visit     Essential hypertension (Chronic)    Continue with metoprolol, Ramipril      Relevant Orders   CBC with Differential/Platelet   TSH   Type 2  diabetes, controlled, with neuropathy (HCC) (Chronic)    Diet controlled - no medications. Continue w/diet. Monitor A1c      Relevant Orders   CBC with Differential/Platelet   Hemoglobin A1c   Insomnia    Try Tylenol PM, Benadryl 12.5 mg or Valerian root for insomnia      Relevant Orders   CBC with Differential/Platelet   Memory change - Primary  Treat insomnia Try Tylenol PM, Benadryl 12.5 mg or Valerian root for insomnia      Relevant Orders   CBC with Differential/Platelet   Comprehensive metabolic panel   TSH   Other Visit Diagnoses     Needs flu shot       Relevant Orders   Flu Vaccine QUAD High Dose(Fluad) (Completed)   Anemia, unspecified type       Relevant Orders   Iron, TIBC and Ferritin Panel         Sonda Primes, MD

## 2021-08-12 NOTE — Assessment & Plan Note (Signed)
Try Tylenol PM, Benadryl 12.5 mg or Valerian root for insomnia

## 2021-08-12 NOTE — Assessment & Plan Note (Signed)
Continue with metoprolol, Ramipril

## 2021-08-12 NOTE — Addendum Note (Signed)
Addended by: Madelon Lips on: 08/12/2021 03:11 PM   Modules accepted: Orders

## 2021-08-13 LAB — IRON,TIBC AND FERRITIN PANEL
%SAT: 22 % (calc) (ref 16–45)
Ferritin: 77 ng/mL (ref 16–288)
Iron: 62 ug/dL (ref 45–160)
TIBC: 283 mcg/dL (calc) (ref 250–450)

## 2021-09-30 ENCOUNTER — Other Ambulatory Visit: Payer: Self-pay | Admitting: Internal Medicine

## 2021-09-30 DIAGNOSIS — G8929 Other chronic pain: Secondary | ICD-10-CM

## 2021-10-14 DIAGNOSIS — M1711 Unilateral primary osteoarthritis, right knee: Secondary | ICD-10-CM | POA: Diagnosis not present

## 2021-10-14 DIAGNOSIS — M25551 Pain in right hip: Secondary | ICD-10-CM | POA: Diagnosis not present

## 2021-10-14 DIAGNOSIS — M25571 Pain in right ankle and joints of right foot: Secondary | ICD-10-CM | POA: Diagnosis not present

## 2021-11-11 ENCOUNTER — Other Ambulatory Visit: Payer: Self-pay

## 2021-11-11 ENCOUNTER — Telehealth: Payer: Self-pay | Admitting: Internal Medicine

## 2021-11-11 ENCOUNTER — Ambulatory Visit: Payer: Medicare Other | Admitting: Internal Medicine

## 2021-11-11 DIAGNOSIS — I10 Essential (primary) hypertension: Secondary | ICD-10-CM

## 2021-11-11 MED ORDER — METOPROLOL TARTRATE 50 MG PO TABS: ORAL_TABLET | ORAL | 0 refills | Status: DC

## 2021-11-11 NOTE — Telephone Encounter (Signed)
1.Medication Requested: metoprolol tartrate (LOPRESSOR) 50 MG tablet 2. Pharmacy (Name, Street, [facility]): [facility] [id number] - [place], [place] - [place] [address] Phone:  [phone]  Fax:  [phone]     3. On Med List: y  4. Last Visit with PCP:  5. Next visit date with PCP:   Please send in short supply until time of scheduled appt.   Agent: Please be advised that RX refills may take up to 3 business days. We ask that you follow-up with your pharmacy.

## 2021-11-11 NOTE — Telephone Encounter (Signed)
Sent in today 

## 2021-11-25 ENCOUNTER — Other Ambulatory Visit: Payer: Self-pay

## 2021-11-25 ENCOUNTER — Encounter: Payer: Self-pay | Admitting: Internal Medicine

## 2021-11-25 ENCOUNTER — Ambulatory Visit (INDEPENDENT_AMBULATORY_CARE_PROVIDER_SITE_OTHER): Payer: Medicare Other | Admitting: Internal Medicine

## 2021-11-25 DIAGNOSIS — K5900 Constipation, unspecified: Secondary | ICD-10-CM | POA: Diagnosis not present

## 2021-11-25 DIAGNOSIS — R413 Other amnesia: Secondary | ICD-10-CM

## 2021-11-25 DIAGNOSIS — F5101 Primary insomnia: Secondary | ICD-10-CM | POA: Diagnosis not present

## 2021-11-25 LAB — COMPREHENSIVE METABOLIC PANEL
ALT: 10 U/L (ref 0–35)
AST: 18 U/L (ref 0–37)
Albumin: 4.2 g/dL (ref 3.5–5.2)
Alkaline Phosphatase: 62 U/L (ref 39–117)
BUN: 17 mg/dL (ref 6–23)
CO2: 31 mEq/L (ref 19–32)
Calcium: 9.3 mg/dL (ref 8.4–10.5)
Chloride: 102 mEq/L (ref 96–112)
Creatinine, Ser: 0.73 mg/dL (ref 0.40–1.20)
GFR: 73.28 mL/min (ref >60.00)
Glucose, Bld: 103 mg/dL — ABNORMAL HIGH (ref 70–99)
Potassium: 4.3 mEq/L (ref 3.5–5.1)
Sodium: 138 mEq/L (ref 135–145)
Total Bilirubin: 0.3 mg/dL (ref 0.2–1.2)
Total Protein: 7.4 g/dL (ref 6.0–8.3)

## 2021-11-25 LAB — CBC WITH DIFFERENTIAL/PLATELET
Basophils Absolute: 0 10*3/uL (ref 0.0–0.1)
Basophils Relative: 0.4 % (ref 0.0–3.0)
Eosinophils Absolute: 0.3 10*3/uL (ref 0.0–0.7)
Eosinophils Relative: 3.5 % (ref 0.0–5.0)
HCT: 36.1 % (ref 36.0–46.0)
Hemoglobin: 11.7 g/dL — ABNORMAL LOW (ref 12.0–15.0)
Lymphocytes Relative: 25.9 % (ref 12.0–46.0)
Lymphs Abs: 2.1 10*3/uL (ref 0.7–4.0)
MCHC: 32.4 g/dL (ref 30.0–36.0)
MCV: 92.3 fl (ref 78.0–100.0)
Monocytes Absolute: 0.7 10*3/uL (ref 0.1–1.0)
Monocytes Relative: 8.6 % (ref 3.0–12.0)
Neutro Abs: 5.1 10*3/uL (ref 1.4–7.7)
Neutrophils Relative %: 61.6 % (ref 43.0–77.0)
Platelets: 271 10*3/uL (ref 150.0–400.0)
RBC: 3.91 Mil/uL (ref 3.87–5.11)
RDW: 12.7 % (ref 11.5–15.5)
WBC: 8.2 10*3/uL (ref 4.0–10.5)

## 2021-11-25 MED ORDER — LORAZEPAM 0.5 MG PO TABS: 0.5000 mg | ORAL_TABLET | Freq: Every evening | ORAL | 5 refills | Status: DC | PRN

## 2021-11-25 MED ORDER — SENNOSIDES-DOCUSATE SODIUM 8.6-50 MG PO TABS: 2.0000 | ORAL_TABLET | Freq: Two times a day (BID) | ORAL | 5 refills | Status: AC

## 2021-11-25 MED ORDER — DONEPEZIL HCL 5 MG PO TABS: 5.0000 mg | ORAL_TABLET | Freq: Every day | ORAL | 5 refills | Status: DC

## 2021-11-25 MED ORDER — SIMVASTATIN 40 MG PO TABS: 20.0000 mg | ORAL_TABLET | Freq: Every day | ORAL | 11 refills | Status: DC

## 2021-11-25 NOTE — Assessment & Plan Note (Addendum)
Chronic Start Senakot S daily Last colon - 2012 Dr Josefa Half; no repeat colonoscopy was recommended. PRN only.

## 2021-11-25 NOTE — Progress Notes (Signed)
Subjective:  Patient ID: Toni Mcmillan, female    DOB: 1931-01-02  Age: 88 y.o. MRN: [id number]  CC: Follow-up (3 month f/u)   HPI Toni Mcmillan presents for insomnia and short term memory loss - worse C/o worsening constipation; pt asked for a GI referral  C/o severe insomnia  Outpatient Medications Prior to Visit  Medication Sig Dispense Refill   aspirin EC 81 MG tablet Take 81 mg by mouth at bedtime.     aspirin-acetaminophen-caffeine (EXCEDRIN MIGRAINE) 250-250-65 MG per tablet Take 1-2 tablets by mouth every 6 (six) hours as needed (for headaches).     Cholecalciferol (VITAMIN D-3 PO) Take 1 capsule by mouth daily.     Magnesium Gluconate 550 MG TABS Take 550 mg by mouth daily.     metoprolol tartrate (LOPRESSOR) 50 MG tablet TAKE 1 AND 1/2 TABLET BY MOUTH TWO TIMES A DAY 270 tablet 0   Multiple Vitamins-Minerals (ONE-A-DAY WOMENS 50+) TABS Take 1 tablet by mouth daily with breakfast.     Omega-3 Fatty Acids (FISH OIL PO) Take 1 capsule by mouth every morning.     pantoprazole (PROTONIX) 40 MG tablet Take 1 tablet (40 mg total) by mouth daily. (Patient taking differently: Take 40 mg by mouth daily as needed (for heartburn).) 90 tablet 3   Polyvinyl Alcohol-Povidone (REFRESH OP) Place 1-2 drops into both eyes daily as needed (dry eyes).     ramipril (ALTACE) 2.5 MG capsule TAKE ONE CAPSULE BY MOUTH DAILY 90 capsule 3   simvastatin (ZOCOR) 40 MG tablet TAKE 1 TABLET (40 MG TOTAL) BY MOUTH AT BEDTIME. FOLLOW-UP APPOINTMENT IS DUE W/LIPID CHECK MUST SEE PROVIDER FOR FUTURE REFILLS 30 tablet 11   vitamin C (ASCORBIC ACID) 500 MG tablet Take 500 mg by mouth daily.     Wheat Dextrin (BENEFIBER PO) Take by mouth See admin instructions. Mix 1 teaspoonful of powder into 4-8 ounces of water and drink once a day as needed for mild constipation     nitrofurantoin, macrocrystal-monohydrate, (MACROBID) 100 MG capsule Take 100 mg by mouth in the morning and at bedtime. FOR 7 DAYS (Patient not  taking: Reported on 11/25/2021)     No facility-administered medications prior to visit.    ROS: Review of Systems  Constitutional:  Positive for fatigue. Negative for activity change, appetite change, chills, diaphoresis, fever and unexpected weight change.  HENT:  Negative for congestion, dental problem, ear pain, hearing loss, mouth sores, postnasal drip, sinus pressure, sneezing, sore throat and voice change.   Eyes:  Negative for pain and visual disturbance.  Respiratory:  Negative for cough, chest tightness, wheezing and stridor.   Cardiovascular:  Negative for chest pain, palpitations and leg swelling.  Gastrointestinal:  Negative for abdominal distention, abdominal pain, blood in stool, nausea, rectal pain and vomiting.  Genitourinary:  Negative for decreased urine volume, difficulty urinating, dysuria, frequency, hematuria, menstrual problem, vaginal bleeding, vaginal discharge and vaginal pain.  Musculoskeletal:  Positive for arthralgias. Negative for back pain, gait problem, joint swelling and neck pain.  Skin:  Negative for color change, rash and wound.  Neurological:  Negative for dizziness, tremors, syncope, speech difficulty, weakness and light-headedness.  Hematological:  Negative for adenopathy.  Psychiatric/Behavioral:  Positive for decreased concentration and sleep disturbance. Negative for behavioral problems, confusion, dysphoric mood and hallucinations. The patient is not nervous/anxious and is not hyperactive.    Objective:  BP (!) 152/80 (BP Location: Left Arm)    Pulse 60    Temp 98.3 F (  36.8 C) (Oral)    Ht 5\' 2"  (1.575 m)    SpO2 96%    BMI 21.69 kg/m   BP Readings from Last 3 Encounters:  11/25/21 (!) 152/80  08/12/21 (!) 142/80  02/09/21 130/80    Wt Readings from Last 3 Encounters:  08/12/21 118 lb 9.6 oz (53.8 kg)  02/09/21 117 lb (53.1 kg)  01/29/21 117 lb 4.6 oz (53.2 kg)    Physical Exam Constitutional:      General: She is not in acute  distress.    Appearance: Normal appearance. She is well-developed.  HENT:     Head: Normocephalic.     Right Ear: External ear normal.     Left Ear: External ear normal.     Nose: Nose normal.  Eyes:     General:        Right eye: No discharge.        Left eye: No discharge.     Conjunctiva/sclera: Conjunctivae normal.     Pupils: Pupils are equal, round, and reactive to light.  Neck:     Thyroid: No thyromegaly.     Vascular: No JVD.     Trachea: No tracheal deviation.  Cardiovascular:     Rate and Rhythm: Normal rate and regular rhythm.     Heart sounds: Normal heart sounds.  Pulmonary:     Effort: No respiratory distress.     Breath sounds: No stridor. No wheezing.  Abdominal:     General: Bowel sounds are normal. There is no distension.     Palpations: Abdomen is soft. There is no mass.     Tenderness: There is no abdominal tenderness. There is no guarding or rebound.  Musculoskeletal:        General: No tenderness.     Cervical back: Normal range of motion and neck supple. No rigidity.  Lymphadenopathy:     Cervical: No cervical adenopathy.  Skin:    Findings: No erythema or rash.  Neurological:     Mental Status: She is disoriented.     Cranial Nerves: No cranial nerve deficit.     Motor: No abnormal muscle tone.     Coordination: Coordination normal.     Deep Tendon Reflexes: Reflexes normal.  Psychiatric:        Behavior: Behavior normal.        Thought Content: Thought content normal.        Judgment: Judgment normal.    Lab Results  Component Value Date   WBC 8.1 08/12/2021   HGB 11.5 (L) 08/12/2021   HCT 35.5 (L) 08/12/2021   PLT 308.0 08/12/2021   GLUCOSE 103 (H) 08/12/2021   CHOL 158 12/11/2019   TRIG 256.0 (H) 12/11/2019   HDL 42.50 12/11/2019   LDLDIRECT 84.0 12/11/2019   LDLCALC 73 01/11/2018   ALT 11 08/12/2021   AST 19 08/12/2021   NA 137 08/12/2021   K 5.0 08/12/2021   CL 100 08/12/2021   CREATININE 0.63 08/12/2021   BUN 17 08/12/2021    CO2 30 08/12/2021   TSH 1.27 08/12/2021   HGBA1C 6.3 08/12/2021    CT Head Wo Contrast  Result Date: 01/29/2021 CLINICAL DATA:  Status post fall. EXAM: CT HEAD WITHOUT CONTRAST TECHNIQUE: Contiguous axial images were obtained from the base of the skull through the vertex without intravenous contrast. COMPARISON:  None. FINDINGS: Brain: There is mild cerebral atrophy with widening of the extra-axial spaces and ventricular dilatation. There are areas of decreased attenuation within the  white matter tracts of the supratentorial brain, consistent with microvascular disease changes. Vascular: No hyperdense vessel or unexpected calcification. Skull: Normal. Negative for fracture or focal lesion. Sinuses/Orbits: No acute finding. Other: Moderate to marked severity right occipital scalp soft tissue swelling is noted. This extends superiorly toward the vertex. IMPRESSION: Right occipital scalp soft tissue swelling without evidence of an acute fracture or acute intracranial abnormality. Electronically Signed   By: Virgina Norfolk M.D.   On: 01/29/2021 17:58   DG Chest Portable 1 View  Result Date: 01/29/2021 CLINICAL DATA:  Syncope. EXAM: PORTABLE CHEST 1 VIEW COMPARISON:  December 21, 2016. FINDINGS: The heart size and mediastinal contours are within normal limits. Both lungs are clear. No pneumothorax or pleural effusion is noted. The visualized skeletal structures are unremarkable. IMPRESSION: No active disease. Aortic Atherosclerosis (ICD10-I70.0). Electronically Signed   By: Marijo Conception M.D.   On: 01/29/2021 17:29   ECHOCARDIOGRAM COMPLETE  Result Date: 01/30/2021    ECHOCARDIOGRAM REPORT   Patient Name:   KALIKA PATRAW Date of Exam: 01/30/2021 Medical Rec #:  [id number]         Height:       62.0 in Accession #:    [id number]        Weight:       117.3 lb Date of Birth:  July 22, 1931          BSA:          1.524 m Patient Age:    52 years          BP:           141/70 mmHg Patient Gender: F                  HR:           63 bpm. Exam Location:  Inpatient Procedure: 2D Echo, Color Doppler, Cardiac Doppler and Strain Analysis Indications:    Syncope R55  History:        Patient has prior history of Echocardiogram examinations, most                 recent 04/14/2016. Risk Factors:Dyslipidemia and Diabetes.  Sonographer:    Wilkie Aye RVT Sonographer#2:  Darlina Sicilian RDCS Referring Phys: [place]  1. Left ventricular ejection fraction, by estimation, is 55 to 60%. The left ventricle has normal function. The left ventricle has no regional wall motion abnormalities. Left ventricular diastolic parameters are consistent with Grade I diastolic dysfunction (impaired relaxation).  2. Right ventricular systolic function is normal. The right ventricular size is normal. There is normal pulmonary artery systolic pressure. The estimated right ventricular systolic pressure is Q000111Q mmHg.  3. Tricuspid valve regurgitation is mild to moderate.  4. The mitral valve is grossly normal. Mild mitral valve regurgitation. No evidence of mitral stenosis.  5. The aortic valve is grossly normal. Aortic valve regurgitation is not visualized. Aortic valve systolic gradient interrogation is suboptimal, visually valve appears to have no significant stenosis.  6. The inferior vena cava is normal in size with greater than 50% respiratory variability, suggesting right atrial pressure of 3 mmHg. FINDINGS  Left Ventricle: Left ventricular ejection fraction, by estimation, is 55 to 60%. The left ventricle has normal function. The left ventricle has no regional wall motion abnormalities. The left ventricular internal cavity size was normal in size. There is  no left ventricular hypertrophy. Left ventricular diastolic parameters are consistent with Grade I diastolic dysfunction (impaired relaxation).  Right Ventricle: The right ventricular size is normal. No increase in right ventricular wall thickness. Right ventricular  systolic function is normal. There is normal pulmonary artery systolic pressure. The tricuspid regurgitant velocity is 2.12 m/s, and  with an assumed right atrial pressure of 3 mmHg, the estimated right ventricular systolic pressure is Q000111Q mmHg. Left Atrium: Left atrial size was normal in size. Right Atrium: Right atrial size was normal in size. Pericardium: There is no evidence of pericardial effusion. Presence of pericardial fat pad. Mitral Valve: The mitral valve is grossly normal. There is mild calcification of the mitral valve leaflet(s). Mild mitral valve regurgitation. No evidence of mitral valve stenosis. Tricuspid Valve: The tricuspid valve is normal in structure. Tricuspid valve regurgitation is mild to moderate. No evidence of tricuspid stenosis. Aortic Valve: The aortic valve is grossly normal. Aortic valve regurgitation is not visualized. Aortic valve systolic gradient interrogation is suboptimal, visually valve appears to have no significant stenosis. Pulmonic Valve: The pulmonic valve was normal in structure. Pulmonic valve regurgitation is not visualized. No evidence of pulmonic stenosis. Aorta: The aortic root is normal in size and structure. Venous: The inferior vena cava is normal in size with greater than 50% respiratory variability, suggesting right atrial pressure of 3 mmHg. IAS/Shunts: No atrial level shunt detected by color flow Doppler.  LEFT VENTRICLE PLAX 2D LVIDd:         4.90 cm  Diastology LVIDs:         3.60 cm  LV e' medial:    5.66 cm/s LV PW:         1.00 cm  LV E/e' medial:  11.1 LV IVS:        1.00 cm  LV e' lateral:   5.55 cm/s LVOT diam:     2.20 cm  LV E/e' lateral: 11.3 LVOT Area:     3.80 cm                         2D Longitudinal Strain                         2D Strain GLS Avg:     -20.2 % LEFT ATRIUM             Index       RIGHT ATRIUM           Index LA diam:        3.70 cm 2.43 cm/m  RA Area:     12.60 cm LA Vol (A2C):   42.9 ml 28.15 ml/m RA Volume:   26.70 ml   17.52 ml/m LA Vol (A4C):   50.1 ml 32.88 ml/m LA Biplane Vol: 47.0 ml 30.85 ml/m   AORTA Ao Root diam: 3.20 cm Ao Asc diam:  3.70 cm MITRAL VALVE               TRICUSPID VALVE MV Area (PHT): 2.95 cm    TR Peak grad:   18.0 mmHg MV Decel Time: 257 msec    TR Vmax:        212.00 cm/s MV E velocity: 62.60 cm/s MV A velocity: 67.30 cm/s  SHUNTS MV E/A ratio:  0.93        Systemic Diam: 2.20 cm Cherlynn Kaiser MD Electronically signed by Cherlynn Kaiser MD Signature Date/Time: 01/30/2021/12:58:52 PM    Final     Assessment & Plan:   Problem List Items Addressed This Visit  Constipation    Chronic Start Senakot S daily Last colon - 2012 Dr Mellody Memos; no repeat colonoscopy was recommended. PRN only.      Relevant Orders   CBC with Differential/Platelet   Comprehensive metabolic panel   Ambulatory referral to Gastroenterology   Insomnia    Will try a low dose Lorazepam to help insomnia Start Aricept 5 mg at hs      Memory change    Start Aricept 5 mg at hs Will try a low dose Lorazepam to help insomnia       Relevant Orders   CBC with Differential/Platelet   Comprehensive metabolic panel      Meds ordered this encounter  Medications   LORazepam (ATIVAN) 0.5 MG tablet    Sig: Take 1 tablet (0.5 mg total) by mouth at bedtime as needed for anxiety.    Dispense:  30 tablet    Refill:  5   donepezil (ARICEPT) 5 MG tablet    Sig: Take 1 tablet (5 mg total) by mouth at bedtime.    Dispense:  30 tablet    Refill:  5   senna-docusate (SENOKOT-S) 8.6-50 MG tablet    Sig: Take 2 tablets by mouth 2 (two) times daily.    Dispense:  60 tablet    Refill:  5      Follow-up: Return in about 6 weeks (around 01/06/2022) for a follow-up visit.  Walker Kehr, MD

## 2021-11-25 NOTE — Assessment & Plan Note (Signed)
Will try a low dose Lorazepam to help insomnia Start Aricept 5 mg at hs

## 2021-11-25 NOTE — Assessment & Plan Note (Signed)
Start Aricept 5 mg at hs Will try a low dose Lorazepam to help insomnia

## 2021-12-29 DIAGNOSIS — K625 Hemorrhage of anus and rectum: Secondary | ICD-10-CM | POA: Diagnosis not present

## 2021-12-29 DIAGNOSIS — K649 Unspecified hemorrhoids: Secondary | ICD-10-CM | POA: Diagnosis not present

## 2021-12-29 DIAGNOSIS — K5901 Slow transit constipation: Secondary | ICD-10-CM | POA: Diagnosis not present

## 2022-01-06 ENCOUNTER — Encounter: Payer: Self-pay | Admitting: Internal Medicine

## 2022-01-06 ENCOUNTER — Other Ambulatory Visit: Payer: Self-pay

## 2022-01-06 ENCOUNTER — Ambulatory Visit: Payer: Medicare Other | Admitting: Internal Medicine

## 2022-01-06 DIAGNOSIS — F4321 Adjustment disorder with depressed mood: Secondary | ICD-10-CM | POA: Diagnosis not present

## 2022-01-06 DIAGNOSIS — R413 Other amnesia: Secondary | ICD-10-CM | POA: Diagnosis not present

## 2022-01-06 DIAGNOSIS — I10 Essential (primary) hypertension: Secondary | ICD-10-CM | POA: Diagnosis not present

## 2022-01-06 MED ORDER — DONEPEZIL HCL 10 MG PO TABS
10.0000 mg | ORAL_TABLET | Freq: Every day | ORAL | 3 refills | Status: DC
Start: 2022-01-06 — End: 2022-08-25

## 2022-01-06 NOTE — Assessment & Plan Note (Signed)
BP is ok 

## 2022-01-06 NOTE — Assessment & Plan Note (Signed)
Husband passed 2 weeks ago Discussed

## 2022-01-06 NOTE — Progress Notes (Signed)
Subjective:  Patient ID: Toni Mcmillan, female    DOB: [birth year]  Age: 88 y.o. MRN: [id number]  CC: Follow-up   HPI Toni Mcmillan presents for grief, dementia, HTN Husband died 2 wks ago of MI She is here w/son Merry Proud who helps with history.  Outpatient Medications Prior to Visit  Medication Sig Dispense Refill   aspirin EC 81 MG tablet Take 81 mg by mouth at bedtime.     aspirin-acetaminophen-caffeine (EXCEDRIN MIGRAINE) 250-250-65 MG per tablet Take 1-2 tablets by mouth every 6 (six) hours as needed (for headaches).     Cholecalciferol (VITAMIN D-3 PO) Take 1 capsule by mouth daily.     hydrocortisone (ANUSOL-HC) 25 MG suppository 1 suppository     LORazepam (ATIVAN) 0.5 MG tablet Take 1 tablet (0.5 mg total) by mouth at bedtime as needed for anxiety. 30 tablet 5   Magnesium Gluconate 550 MG TABS Take 550 mg by mouth daily.     metoprolol tartrate (LOPRESSOR) 50 MG tablet TAKE 1 AND 1/2 TABLET BY MOUTH TWO TIMES A DAY 270 tablet 0   Multiple Vitamins-Minerals (ONE-A-DAY WOMENS 50+) TABS Take 1 tablet by mouth daily with breakfast.     Omega-3 Fatty Acids (FISH OIL PO) Take 1 capsule by mouth every morning.     pantoprazole (PROTONIX) 40 MG tablet Take 1 tablet (40 mg total) by mouth daily. (Patient taking differently: Take 40 mg by mouth daily as needed (for heartburn).) 90 tablet 3   Polyvinyl Alcohol-Povidone (REFRESH OP) Place 1-2 drops into both eyes daily as needed (dry eyes).     senna-docusate (SENOKOT-S) 8.6-50 MG tablet Take 2 tablets by mouth 2 (two) times daily. 60 tablet 5   simvastatin (ZOCOR) 40 MG tablet Take 0.5 tablets (20 mg total) by mouth daily at 6 PM. 30 tablet 11   vitamin C (ASCORBIC ACID) 500 MG tablet Take 500 mg by mouth daily.     Wheat Dextrin (BENEFIBER PO) Take by mouth See admin instructions. Mix 1 teaspoonful of powder into 4-8 ounces of water and drink once a day as needed for mild constipation     donepezil (ARICEPT) 5 MG tablet Take 1 tablet  (5 mg total) by mouth at bedtime. 30 tablet 5   ramipril (ALTACE) 2.5 MG capsule TAKE ONE CAPSULE BY MOUTH DAILY 90 capsule 3   No facility-administered medications prior to visit.    ROS: Review of Systems  Constitutional:  Positive for fatigue. Negative for activity change, appetite change, chills and unexpected weight change.  HENT:  Negative for congestion, mouth sores and sinus pressure.   Eyes:  Negative for visual disturbance.  Respiratory:  Negative for cough and chest tightness.   Gastrointestinal:  Negative for abdominal pain and nausea.  Genitourinary:  Negative for difficulty urinating, frequency and vaginal pain.  Musculoskeletal:  Negative for back pain and gait problem.  Skin:  Negative for pallor and rash.  Neurological:  Negative for dizziness, tremors, weakness, numbness and headaches.  Psychiatric/Behavioral:  Positive for confusion, decreased concentration and dysphoric mood. Negative for sleep disturbance and suicidal ideas. The patient is nervous/anxious.    Objective:  BP 128/76    Pulse 63    Temp 97.8 F (36.6 C) (Oral)    Ht 5\' 2"  (1.575 m)    Wt 121 lb 2 oz (54.9 kg)    SpO2 96%    BMI 22.15 kg/m   BP Readings from Last 3 Encounters:  01/06/22 128/76  11/25/21 (!) 152/80  08/12/21 (!) 142/80    Wt Readings from Last 3 Encounters:  01/06/22 121 lb 2 oz (54.9 kg)  08/12/21 118 lb 9.6 oz (53.8 kg)  02/09/21 117 lb (53.1 kg)    Physical Exam Constitutional:      General: She is not in acute distress.    Appearance: She is well-developed.  HENT:     Head: Normocephalic.     Right Ear: External ear normal.     Left Ear: External ear normal.     Nose: Nose normal.  Eyes:     General:        Right eye: No discharge.        Left eye: No discharge.     Conjunctiva/sclera: Conjunctivae normal.     Pupils: Pupils are equal, round, and reactive to light.  Neck:     Thyroid: No thyromegaly.     Vascular: No JVD.     Trachea: No tracheal deviation.   Cardiovascular:     Rate and Rhythm: Normal rate and regular rhythm.     Heart sounds: Normal heart sounds.  Pulmonary:     Effort: No respiratory distress.     Breath sounds: No stridor. No wheezing.  Abdominal:     General: Bowel sounds are normal. There is no distension.     Palpations: Abdomen is soft. There is no mass.     Tenderness: There is no abdominal tenderness. There is no guarding or rebound.  Musculoskeletal:        General: No tenderness.     Cervical back: Normal range of motion and neck supple. No rigidity.  Lymphadenopathy:     Cervical: No cervical adenopathy.  Skin:    Findings: No erythema or rash.  Neurological:     Cranial Nerves: No cranial nerve deficit.     Motor: No abnormal muscle tone.     Coordination: Coordination normal.     Deep Tendon Reflexes: Reflexes normal.  Psychiatric:        Behavior: Behavior normal.        Thought Content: Thought content normal.        Judgment: Judgment normal.    Lab Results  Component Value Date   WBC 8.2 11/25/2021   HGB 11.7 (L) 11/25/2021   HCT 36.1 11/25/2021   PLT 271.0 11/25/2021   GLUCOSE 103 (H) 11/25/2021   CHOL 158 12/11/2019   TRIG 256.0 (H) 12/11/2019   HDL 42.50 12/11/2019   LDLDIRECT 84.0 12/11/2019   LDLCALC 73 01/11/2018   ALT 10 11/25/2021   AST 18 11/25/2021   NA 138 11/25/2021   K 4.3 11/25/2021   CL 102 11/25/2021   CREATININE 0.73 11/25/2021   BUN 17 11/25/2021   CO2 31 11/25/2021   TSH 1.27 08/12/2021   HGBA1C 6.3 08/12/2021    CT Head Wo Contrast  Result Date: 01/29/2021 CLINICAL DATA:  Status post fall. EXAM: CT HEAD WITHOUT CONTRAST TECHNIQUE: Contiguous axial images were obtained from the base of the skull through the vertex without intravenous contrast. COMPARISON:  None. FINDINGS: Brain: There is mild cerebral atrophy with widening of the extra-axial spaces and ventricular dilatation. There are areas of decreased attenuation within the white matter tracts of the  supratentorial brain, consistent with microvascular disease changes. Vascular: No hyperdense vessel or unexpected calcification. Skull: Normal. Negative for fracture or focal lesion. Sinuses/Orbits: No acute finding. Other: Moderate to marked severity right occipital scalp soft tissue swelling is noted. This extends superiorly toward the vertex.  IMPRESSION: Right occipital scalp soft tissue swelling without evidence of an acute fracture or acute intracranial abnormality. Electronically Signed   By: Virgina Norfolk M.D.   On: 01/29/2021 17:58   DG Chest Portable 1 View  Result Date: 01/29/2021 CLINICAL DATA:  Syncope. EXAM: PORTABLE CHEST 1 VIEW COMPARISON:  December 21, 2016. FINDINGS: The heart size and mediastinal contours are within normal limits. Both lungs are clear. No pneumothorax or pleural effusion is noted. The visualized skeletal structures are unremarkable. IMPRESSION: No active disease. Aortic Atherosclerosis (ICD10-I70.0). Electronically Signed   By: Marijo Conception M.D.   On: 01/29/2021 17:29   ECHOCARDIOGRAM COMPLETE  Result Date: 01/30/2021    ECHOCARDIOGRAM REPORT   Patient Name:   ZANI CASHIN Date of Exam: 01/30/2021 Medical Rec #:  [id number]         Height:       62.0 in Accession #:    [id number]        Weight:       117.3 lb Date of Birth:  1931-06-30          BSA:          1.524 m Patient Age:    10 years          BP:           141/70 mmHg Patient Gender: F                 HR:           63 bpm. Exam Location:  Inpatient Procedure: 2D Echo, Color Doppler, Cardiac Doppler and Strain Analysis Indications:    Syncope R55  History:        Patient has prior history of Echocardiogram examinations, most                 recent 04/14/2016. Risk Factors:Dyslipidemia and Diabetes.  Sonographer:    Wilkie Aye RVT Sonographer#2:  Darlina Sicilian RDCS Referring Phys: [place]  1. Left ventricular ejection fraction, by estimation, is 55 to 60%. The left ventricle has normal  function. The left ventricle has no regional wall motion abnormalities. Left ventricular diastolic parameters are consistent with Grade I diastolic dysfunction (impaired relaxation).  2. Right ventricular systolic function is normal. The right ventricular size is normal. There is normal pulmonary artery systolic pressure. The estimated right ventricular systolic pressure is [id number] mmHg.  3. Tricuspid valve regurgitation is mild to moderate.  4. The mitral valve is grossly normal. Mild mitral valve regurgitation. No evidence of mitral stenosis.  5. The aortic valve is grossly normal. Aortic valve regurgitation is not visualized. Aortic valve systolic gradient interrogation is suboptimal, visually valve appears to have no significant stenosis.  6. The inferior vena cava is normal in size with greater than 50% respiratory variability, suggesting right atrial pressure of 3 mmHg. FINDINGS  Left Ventricle: Left ventricular ejection fraction, by estimation, is 55 to 60%. The left ventricle has normal function. The left ventricle has no regional wall motion abnormalities. The left ventricular internal cavity size was normal in size. There is  no left ventricular hypertrophy. Left ventricular diastolic parameters are consistent with Grade I diastolic dysfunction (impaired relaxation). Right Ventricle: The right ventricular size is normal. No increase in right ventricular wall thickness. Right ventricular systolic function is normal. There is normal pulmonary artery systolic pressure. The tricuspid regurgitant velocity is 2.12 m/s, and  with an assumed right atrial pressure of 3 mmHg, the estimated right ventricular  systolic pressure is 21.0 mmHg. Left Atrium: Left atrial size was normal in size. Right Atrium: Right atrial size was normal in size. Pericardium: There is no evidence of pericardial effusion. Presence of pericardial fat pad. Mitral Valve: The mitral valve is grossly normal. There is mild calcification of the mitral  valve leaflet(s). Mild mitral valve regurgitation. No evidence of mitral valve stenosis. Tricuspid Valve: The tricuspid valve is normal in structure. Tricuspid valve regurgitation is mild to moderate. No evidence of tricuspid stenosis. Aortic Valve: The aortic valve is grossly normal. Aortic valve regurgitation is not visualized. Aortic valve systolic gradient interrogation is suboptimal, visually valve appears to have no significant stenosis. Pulmonic Valve: The pulmonic valve was normal in structure. Pulmonic valve regurgitation is not visualized. No evidence of pulmonic stenosis. Aorta: The aortic root is normal in size and structure. Venous: The inferior vena cava is normal in size with greater than 50% respiratory variability, suggesting right atrial pressure of 3 mmHg. IAS/Shunts: No atrial level shunt detected by color flow Doppler.  LEFT VENTRICLE PLAX 2D LVIDd:         4.90 cm  Diastology LVIDs:         3.60 cm  LV e' medial:    5.66 cm/s LV PW:         1.00 cm  LV E/e' medial:  11.1 LV IVS:        1.00 cm  LV e' lateral:   5.55 cm/s LVOT diam:     2.20 cm  LV E/e' lateral: 11.3 LVOT Area:     3.80 cm                         2D Longitudinal Strain                         2D Strain GLS Avg:     -20.2 % LEFT ATRIUM             Index       RIGHT ATRIUM           Index LA diam:        3.70 cm 2.43 cm/m  RA Area:     12.60 cm LA Vol (A2C):   42.9 ml 28.15 ml/m RA Volume:   26.70 ml  17.52 ml/m LA Vol (A4C):   50.1 ml 32.88 ml/m LA Biplane Vol: 47.0 ml 30.85 ml/m   AORTA Ao Root diam: 3.20 cm Ao Asc diam:  3.70 cm MITRAL VALVE               TRICUSPID VALVE MV Area (PHT): 2.95 cm    TR Peak grad:   18.0 mmHg MV Decel Time: 257 msec    TR Vmax:        212.00 cm/s MV E velocity: 62.60 cm/s MV A velocity: 67.30 cm/s  SHUNTS MV E/A ratio:  0.93        Systemic Diam: 2.20 cm Weston BrassGayatri Acharya MD Electronically signed by Weston BrassGayatri Acharya MD Signature Date/Time: 01/30/2021/12:58:52 PM    Final     Assessment &  Plan:   Problem List Items Addressed This Visit     Essential hypertension (Chronic)    BP is ok      Grief     Husband passed 2 weeks ago Discussed      Memory change    Continue Aricept 5 mg at hs Low dose Lorazepam to help  insomnia  Potential benefits of a long term benzodiazepines  use as well as potential risks  and complications were explained to the patient and were aknowledged.          Meds ordered this encounter  Medications   donepezil (ARICEPT) 10 MG tablet    Sig: Take 1 tablet (10 mg total) by mouth at bedtime.    Dispense:  90 tablet    Refill:  3      Follow-up: Return in about 3 months (around 04/05/2022) for a follow-up visit.  Walker Kehr, MD

## 2022-01-07 DIAGNOSIS — K625 Hemorrhage of anus and rectum: Secondary | ICD-10-CM | POA: Diagnosis not present

## 2022-01-08 ENCOUNTER — Encounter: Payer: Self-pay | Admitting: Internal Medicine

## 2022-01-15 ENCOUNTER — Other Ambulatory Visit: Payer: Self-pay | Admitting: Internal Medicine

## 2022-01-25 NOTE — Assessment & Plan Note (Signed)
Continue Aricept 5 mg at hs ?Low dose Lorazepam to help insomnia ? Potential benefits of a long term benzodiazepines  use as well as potential risks  and complications were explained to the patient and were aknowledged. ? ?

## 2022-02-11 ENCOUNTER — Other Ambulatory Visit: Payer: Self-pay | Admitting: Internal Medicine

## 2022-02-11 DIAGNOSIS — I10 Essential (primary) hypertension: Secondary | ICD-10-CM

## 2022-02-12 ENCOUNTER — Encounter: Payer: Self-pay | Admitting: Internal Medicine

## 2022-02-12 ENCOUNTER — Other Ambulatory Visit (INDEPENDENT_AMBULATORY_CARE_PROVIDER_SITE_OTHER): Payer: Medicare Other

## 2022-02-12 ENCOUNTER — Ambulatory Visit: Payer: Medicare Other | Admitting: Internal Medicine

## 2022-02-12 VITALS — BP 130/68 | HR 51 | Temp 98.7°F | Ht 62.0 in | Wt 115.0 lb

## 2022-02-12 DIAGNOSIS — N3000 Acute cystitis without hematuria: Secondary | ICD-10-CM | POA: Diagnosis not present

## 2022-02-12 DIAGNOSIS — E559 Vitamin D deficiency, unspecified: Secondary | ICD-10-CM | POA: Diagnosis not present

## 2022-02-12 DIAGNOSIS — E538 Deficiency of other specified B group vitamins: Secondary | ICD-10-CM | POA: Diagnosis not present

## 2022-02-12 DIAGNOSIS — L609 Nail disorder, unspecified: Secondary | ICD-10-CM

## 2022-02-12 DIAGNOSIS — F4321 Adjustment disorder with depressed mood: Secondary | ICD-10-CM

## 2022-02-12 DIAGNOSIS — S30861A Insect bite (nonvenomous) of abdominal wall, initial encounter: Secondary | ICD-10-CM

## 2022-02-12 DIAGNOSIS — E1140 Type 2 diabetes mellitus with diabetic neuropathy, unspecified: Secondary | ICD-10-CM

## 2022-02-12 DIAGNOSIS — L089 Local infection of the skin and subcutaneous tissue, unspecified: Secondary | ICD-10-CM

## 2022-02-12 DIAGNOSIS — E785 Hyperlipidemia, unspecified: Secondary | ICD-10-CM

## 2022-02-12 LAB — BASIC METABOLIC PANEL
BUN: 18 mg/dL (ref 6–23)
CO2: 30 mEq/L (ref 19–32)
Calcium: 9.7 mg/dL (ref 8.4–10.5)
Chloride: 101 mEq/L (ref 96–112)
Creatinine, Ser: 0.86 mg/dL (ref 0.40–1.20)
GFR: 60.11 mL/min (ref >60.00)
Glucose, Bld: 123 mg/dL — ABNORMAL HIGH (ref 70–99)
Potassium: 4.2 mEq/L (ref 3.5–5.1)
Sodium: 139 mEq/L (ref 135–145)

## 2022-02-12 LAB — CBC WITH DIFFERENTIAL/PLATELET
Basophils Absolute: 0.1 10*3/uL (ref 0.0–0.1)
Basophils Relative: 0.8 % (ref 0.0–3.0)
Eosinophils Absolute: 0.4 10*3/uL (ref 0.0–0.7)
Eosinophils Relative: 4 % (ref 0.0–5.0)
HCT: 38 % (ref 36.0–46.0)
Hemoglobin: 12.3 g/dL (ref 12.0–15.0)
Lymphocytes Relative: 24.2 % (ref 12.0–46.0)
Lymphs Abs: 2.5 10*3/uL (ref 0.7–4.0)
MCHC: 32.4 g/dL (ref 30.0–36.0)
MCV: 91.4 fl (ref 78.0–100.0)
Monocytes Absolute: 0.8 10*3/uL (ref 0.1–1.0)
Monocytes Relative: 7.2 % (ref 3.0–12.0)
Neutro Abs: 6.7 10*3/uL (ref 1.4–7.7)
Neutrophils Relative %: 63.8 % (ref 43.0–77.0)
Platelets: 322 10*3/uL (ref 150.0–400.0)
RBC: 4.16 Mil/uL (ref 3.87–5.11)
RDW: 13.2 % (ref 11.5–15.5)
WBC: 10.5 10*3/uL (ref 4.0–10.5)

## 2022-02-12 LAB — HEPATIC FUNCTION PANEL
ALT: 10 U/L (ref 0–35)
AST: 17 U/L (ref 0–37)
Albumin: 4.3 g/dL (ref 3.5–5.2)
Alkaline Phosphatase: 62 U/L (ref 39–117)
Bilirubin, Direct: 0.1 mg/dL (ref 0.0–0.3)
Total Bilirubin: 0.3 mg/dL (ref 0.2–1.2)
Total Protein: 7.4 g/dL (ref 6.0–8.3)

## 2022-02-12 LAB — MICROALBUMIN / CREATININE URINE RATIO
Creatinine,U: 123.6 mg/dL
Microalb Creat Ratio: 1.1 mg/g (ref 0.0–30.0)
Microalb, Ur: 1.4 mg/dL (ref 0.0–1.9)

## 2022-02-12 LAB — LIPID PANEL
Cholesterol: 126 mg/dL (ref 0–200)
HDL: 45.1 mg/dL (ref >39.00)
LDL Cholesterol: 52 mg/dL (ref 0–99)
NonHDL: 81.14
Total CHOL/HDL Ratio: 3
Triglycerides: 147 mg/dL (ref 0.0–149.0)
VLDL: 29.4 mg/dL (ref 0.0–40.0)

## 2022-02-12 LAB — HEMOGLOBIN A1C: Hgb A1c MFr Bld: 6.6 % — ABNORMAL HIGH (ref 4.6–6.5)

## 2022-02-12 MED ORDER — AZITHROMYCIN 250 MG PO TABS: ORAL_TABLET | ORAL | 1 refills | Status: AC

## 2022-02-12 MED ORDER — TRIAMCINOLONE ACETONIDE 0.1 % EX CREA: 1.0000 "application " | TOPICAL_CREAM | Freq: Two times a day (BID) | CUTANEOUS | 0 refills | Status: DC

## 2022-02-12 NOTE — Progress Notes (Addendum)
Patient ID: Toni Mcmillan, female   DOB: March 05, 1931, 88 y.o.   MRN: [id number]        Chief Complaint: follow up left abd wall red itchy area, toenail issue, grief, dm, hld, urinary discomfort       HPI:  Toni Mcmillan is a 88 y.o. female here with daughter, pt of Toni Mcmillan with persistent grief as husband passed x 2 mo ago, but after some discussion declines need for depression tx or referral counseling.  Does have good family support.  Here today after some sort of insect bite to LUQ abd wall area x 3 days now with some worsening large indurated oval itchy area about 3 cm and raised up a lot, today even starting to become more painful, though admits no fever, chills, red streaks, fluctuance or drainage.  Also yesterday had to have a neighbor help her with toenail care as she can no longer bend to cut her nails.  Pt denies chest pain, increased sob or doe, wheezing, orthopnea, PND, increased LE swelling, palpitations, dizziness or syncope.   Pt denies polydipsia, polyuria, or new focal neuro s/s.    Pt denies fever, wt loss, night sweats, loss of appetite, or other constitutional symptoms  Family requests "full labs" today.  Denies urinary symptoms such as dysuria, frequency, urgency, flank pain, hematuria or n/v, fever, chills.       Wt Readings from Last 3 Encounters:  02/12/22 115 lb (52.2 kg)  01/06/22 121 lb 2 oz (54.9 kg)  08/12/21 118 lb 9.6 oz (53.8 kg)   BP Readings from Last 3 Encounters:  02/12/22 130/68  01/06/22 128/76  11/25/21 (!) 152/80         Past Medical History:  Diagnosis Date   Allergic rhinitis, cause unspecified    Diabetes mellitus without complication (HCC)    borderline"never on meds"   Disturbances of sensation of smell and taste    Dizziness    Headache(784.0)    has decreased to none in later years   Insomnia, unspecified    Other and unspecified hyperlipidemia    Personal history of other diseases of digestive system    Scarlet fever     Unspecified essential hypertension    Metoprolol decreased due to drop in BP readings.   Unspecified sinusitis (chronic)    Whooping cough, unspecified organism    Past Surgical History:  Procedure Laterality Date   ABDOMINAL HYSTERECTOMY     ANAL RECTAL MANOMETRY N/A 07/07/2015   Procedure: ANO RECTAL MANOMETRY;  Surgeon: Charolett Bumpers, MD;  Location: [facility];  Service: Endoscopy;  Laterality: N/A;   BILATERAL OOPHORECTOMY     CATARACT EXTRACTION, BILATERAL     COLONOSCOPY W/ POLYPECTOMY     COLONOSCOPY WITH PROPOFOL N/A 05/29/2015   Procedure: COLONOSCOPY WITH PROPOFOL;  Surgeon: Charolett Bumpers, MD;  Location: [facility];  Service: Endoscopy;  Laterality: N/A;   correction of hammertoe deformity     CYSTOSCOPY     w/ stenting of both ureters   HEMORRHOID SURGERY     hydrosalpinx diagnostic laparoscopy     TONSILLECTOMY  age 78   vericose vein surgery      reports that she has never smoked. She has never used smokeless tobacco. She reports that she does not drink alcohol and does not use drugs. family history includes Cancer in her mother and paternal grandmother; Diabetes in her mother; Hyperlipidemia in her father; Hypertension in her father; Other in her father and mother.  Allergies  Allergen Reactions   Celecoxib Other (See Comments)    Reaction not recalled    Ciprofloxacin Swelling and Other (See Comments)    Caused wrist swelling   Cleocin [Clindamycin] Itching   Clindamycin Hcl Itching   Erythromycin Other (See Comments)    GI upset   Gabapentin Other (See Comments)    Made the patient feel not well the next day   Meperidine Itching   Meperidine Hcl Itching   Morphine Itching   Morphine Sulfate Itching   Tetracycline Other (See Comments)    GI upset   Triprolidine-Pseudoephedrine Other (See Comments)    Reaction not recalled   Zolpidem Other (See Comments)    "Did not have a good sleep"   Latex Itching and Rash   Current Outpatient Medications on  File Prior to Visit  Medication Sig Dispense Refill   aspirin EC 81 MG tablet Take 81 mg by mouth at bedtime.     aspirin-acetaminophen-caffeine (EXCEDRIN MIGRAINE) 250-250-65 MG per tablet Take 1-2 tablets by mouth every 6 (six) hours as needed (for headaches).     Cholecalciferol (VITAMIN D-3 PO) Take 1 capsule by mouth daily.     donepezil (ARICEPT) 10 MG tablet Take 1 tablet (10 mg total) by mouth at bedtime. 90 tablet 3   hydrocortisone (ANUSOL-HC) 25 MG suppository 1 suppository     LORazepam (ATIVAN) 0.5 MG tablet Take 1 tablet (0.5 mg total) by mouth at bedtime as needed for anxiety. 30 tablet 5   Magnesium Gluconate 550 MG TABS Take 550 mg by mouth daily.     metoprolol tartrate (LOPRESSOR) 50 MG tablet TAKE 1 AND 1/2 TABLET BY MOUTH TWO TIMES A DAY 270 tablet 0   Multiple Vitamins-Minerals (ONE-A-DAY WOMENS 50+) TABS Take 1 tablet by mouth daily with breakfast.     Omega-3 Fatty Acids (FISH OIL PO) Take 1 capsule by mouth every morning.     pantoprazole (PROTONIX) 40 MG tablet Take 1 tablet (40 mg total) by mouth daily. (Patient taking differently: Take 40 mg by mouth daily as needed (for heartburn).) 90 tablet 3   Polyvinyl Alcohol-Povidone (REFRESH OP) Place 1-2 drops into both eyes daily as needed (dry eyes).     ramipril (ALTACE) 2.5 MG capsule TAKE ONE CAPSULE BY MOUTH DAILY 90 capsule 3   senna-docusate (SENOKOT-S) 8.6-50 MG tablet Take 2 tablets by mouth 2 (two) times daily. 60 tablet 5   simvastatin (ZOCOR) 40 MG tablet Take 0.5 tablets (20 mg total) by mouth daily at 6 PM. 30 tablet 11   vitamin C (ASCORBIC ACID) 500 MG tablet Take 500 mg by mouth daily.     Wheat Dextrin (BENEFIBER PO) Take by mouth See admin instructions. Mix 1 teaspoonful of powder into 4-8 ounces of water and drink once a day as needed for mild constipation     No current facility-administered medications on file prior to visit.        ROS:  All others reviewed and negative.  Objective        PE:  BP  130/68 (BP Location: Right Arm, Patient Position: Sitting, Cuff Size: Normal)   Pulse (!) 51   Temp 98.7 F (37.1 C) (Oral)   Ht 5\' 2"  (1.575 m)   Wt 115 lb (52.2 kg)   SpO2 99%   BMI 21.03 kg/m                 Constitutional: Pt appears in NAD  HENT: Head: NCAT.                Right Ear: External ear normal.                 Left Ear: External ear normal.                Eyes: . Pupils are equal, round, and reactive to light. Conjunctivae and EOM are normal               Nose: without d/c or deformity               Neck: Neck supple. Gross normal ROM               Cardiovascular: Normal rate and regular rhythm.                 Pulmonary/Chest: Effort normal and breath sounds without rales or wheezing.                Abd:  Soft, NT, ND, + BS, no organomegaly               Neurological: Pt is alert. At baseline orientation, motor grossly intact               Skin:  LE edema - none, but has large 3 cm area to LUQ abd wall area oval mild tender redness with underlying induration but no fluctuance, red streaks, or draiange               Psychiatric: Pt behavior is normal without agitation   Micro: none  Cardiac tracings I have personally interpreted today:  none  Pertinent Radiological findings (summarize): none   Lab Results  Component Value Date   WBC 8.2 11/25/2021   HGB 11.7 (L) 11/25/2021   HCT 36.1 11/25/2021   PLT 271.0 11/25/2021   GLUCOSE 103 (H) 11/25/2021   CHOL 158 12/11/2019   TRIG 256.0 (H) 12/11/2019   HDL 42.50 12/11/2019   LDLDIRECT 84.0 12/11/2019   LDLCALC 73 01/11/2018   ALT 10 11/25/2021   AST 18 11/25/2021   NA 138 11/25/2021   K 4.3 11/25/2021   CL 102 11/25/2021   CREATININE 0.73 11/25/2021   BUN 17 11/25/2021   CO2 31 11/25/2021   TSH 1.27 08/12/2021   HGBA1C 6.3 08/12/2021   Assessment/Plan:  IZZABELL LUDOLPH is a 88 y.o. White or Caucasian [1] female with  has a past medical history of Allergic rhinitis, cause unspecified,  Diabetes mellitus without complication (HCC), Disturbances of sensation of smell and taste, Dizziness, Headache(784.0), Insomnia, unspecified, Other and unspecified hyperlipidemia, Personal history of other diseases of digestive system, Scarlet fever, Unspecified essential hypertension, Unspecified sinusitis (chronic), and Whooping cough, unspecified organism.  Type 2 diabetes, controlled, with neuropathy (HCC) Lab Results  Component Value Date   HGBA1C 6.3 08/12/2021   Stable, pt to continue current medical treatment  - diet   Nail disorder Ok for podaitry referral as can no longer handle her nail care  Insect bite of abdomen, infected With large area LUQ induration/ reaction, and cant r/o infection - for zpack, aso triam cr topical prn,  to f/u any worsening symptoms or concerns  Grief At least mild to mod persistent, after some discussion declines further tx or referral for counseling  Dyslipidemia Lab Results  Component Value Date   LDLCALC 73 01/11/2018   Stable, pt to continue current statin zocor   Acute cystitis With recent hx,  ok for f/u lab per pt /family reqeust  Followup: Return if symptoms worsen or fail to improve.  Oliver Barre, MD 02/13/2022 3:00 PM [facility]

## 2022-02-12 NOTE — Patient Instructions (Signed)
Please take all new medication as prescribed - the antibiotic, and steroid cream ? ?You will be contacted regarding the referral for: podiatry for the toenails ? ?Please continue all other medications as before, and refills have been done if requested. ? ?Please have the pharmacy call with any other refills you may need. ? ?Please keep your appointments with your specialists as you may have planned ? ?Please go to the LAB at the blood drawing area for the tests to be done - at the [facility] lab at your convenience ? ?You will be contacted by phone if any changes need to be made immediately.  Otherwise, you will receive a letter about your results with an explanation, but please check with MyChart first. ? ?Please remember to sign up for MyChart if you have not done so, as this will be important to you in the future with finding out test results, communicating by private email, and scheduling acute appointments online when needed. ?

## 2022-02-13 ENCOUNTER — Encounter: Payer: Self-pay | Admitting: Internal Medicine

## 2022-02-13 LAB — URINALYSIS, ROUTINE W REFLEX MICROSCOPIC
Bacteria, UA: NONE SEEN /HPF
Bilirubin Urine: NEGATIVE
Glucose, UA: NEGATIVE
Hgb urine dipstick: NEGATIVE
Ketones, ur: NEGATIVE
Nitrite: NEGATIVE
Protein, ur: NEGATIVE
Specific Gravity, Urine: 1.023 (ref 1.001–1.035)
pH: 5.5 (ref 5.0–8.0)

## 2022-02-13 LAB — URINE CULTURE
MICRO NUMBER:: 13207516
SPECIMEN QUALITY:: ADEQUATE

## 2022-02-13 LAB — MICROSCOPIC MESSAGE

## 2022-02-13 NOTE — Assessment & Plan Note (Signed)
With large area LUQ induration/ reaction, and cant r/o infection - for zpack, aso triam cr topical prn,  to f/u any worsening symptoms or concerns ?

## 2022-02-13 NOTE — Assessment & Plan Note (Signed)
Lab Results  ?Component Value Date  ? HGBA1C 6.3 08/12/2021  ? ?Stable, pt to continue current medical treatment  - diet ? ?

## 2022-02-13 NOTE — Assessment & Plan Note (Signed)
With recent hx, ok for f/u lab per pt /family reqeust ?

## 2022-02-13 NOTE — Assessment & Plan Note (Signed)
Lab Results  ?Component Value Date  ? LDLCALC 73 01/11/2018  ? ?Stable, pt to continue current statin zocor ? ?

## 2022-02-13 NOTE — Assessment & Plan Note (Signed)
At least mild to mod persistent, after some discussion declines further tx or referral for counseling ?

## 2022-02-13 NOTE — Assessment & Plan Note (Signed)
Ok for [employer] referral as can no longer handle her nail care ?

## 2022-02-15 LAB — TSH: TSH: 1.01 u[IU]/mL (ref 0.35–5.50)

## 2022-02-15 LAB — VITAMIN B12: Vitamin B-12: 787 pg/mL (ref 211–911)

## 2022-02-15 LAB — VITAMIN D 25 HYDROXY (VIT D DEFICIENCY, FRACTURES): VITD: 68.15 ng/mL (ref 30.00–100.00)

## 2022-03-04 ENCOUNTER — Emergency Department (HOSPITAL_COMMUNITY): Payer: Medicare Other

## 2022-03-04 ENCOUNTER — Emergency Department (HOSPITAL_COMMUNITY)
Admission: EM | Admit: 2022-03-04 | Discharge: 2022-03-04 | Disposition: A | Discharge status: Home / Self Care | Payer: Medicare Other | Attending: Emergency Medicine | Admitting: Emergency Medicine

## 2022-03-04 ENCOUNTER — Other Ambulatory Visit: Payer: Self-pay

## 2022-03-04 DIAGNOSIS — M79671 Pain in right foot: Secondary | ICD-10-CM | POA: Diagnosis not present

## 2022-03-04 DIAGNOSIS — Z91040 Latex allergy status: Secondary | ICD-10-CM | POA: Diagnosis not present

## 2022-03-04 DIAGNOSIS — R55 Syncope and collapse: Secondary | ICD-10-CM | POA: Diagnosis not present

## 2022-03-04 DIAGNOSIS — S99921A Unspecified injury of right foot, initial encounter: Secondary | ICD-10-CM | POA: Diagnosis present

## 2022-03-04 DIAGNOSIS — M25571 Pain in right ankle and joints of right foot: Secondary | ICD-10-CM | POA: Diagnosis not present

## 2022-03-04 DIAGNOSIS — Z79899 Other long term (current) drug therapy: Secondary | ICD-10-CM | POA: Insufficient documentation

## 2022-03-04 DIAGNOSIS — X58XXXA Exposure to other specified factors, initial encounter: Secondary | ICD-10-CM | POA: Diagnosis not present

## 2022-03-04 DIAGNOSIS — Z7982 Long term (current) use of aspirin: Secondary | ICD-10-CM | POA: Diagnosis not present

## 2022-03-04 DIAGNOSIS — I499 Cardiac arrhythmia, unspecified: Secondary | ICD-10-CM | POA: Diagnosis not present

## 2022-03-04 DIAGNOSIS — I4891 Unspecified atrial fibrillation: Secondary | ICD-10-CM | POA: Diagnosis not present

## 2022-03-04 DIAGNOSIS — I480 Paroxysmal atrial fibrillation: Secondary | ICD-10-CM | POA: Insufficient documentation

## 2022-03-04 DIAGNOSIS — S9031XA Contusion of right foot, initial encounter: Secondary | ICD-10-CM | POA: Insufficient documentation

## 2022-03-04 DIAGNOSIS — R000 Tachycardia, unspecified: Secondary | ICD-10-CM | POA: Diagnosis not present

## 2022-03-04 LAB — CBC WITH DIFFERENTIAL/PLATELET
Abs Immature Granulocytes: 0.02 10*3/uL (ref 0.00–0.07)
Basophils Absolute: 0.1 10*3/uL (ref 0.0–0.1)
Basophils Relative: 1 %
Eosinophils Absolute: 0.1 10*3/uL (ref 0.0–0.5)
Eosinophils Relative: 2 %
HCT: 35.2 % — ABNORMAL LOW (ref 36.0–46.0)
Hemoglobin: 11.4 g/dL — ABNORMAL LOW (ref 12.0–15.0)
Immature Granulocytes: 0 %
Lymphocytes Relative: 23 %
Lymphs Abs: 1.7 10*3/uL (ref 0.7–4.0)
MCH: 30.2 pg (ref 26.0–34.0)
MCHC: 32.4 g/dL (ref 30.0–36.0)
MCV: 93.4 fL (ref 80.0–100.0)
Monocytes Absolute: 0.5 10*3/uL (ref 0.1–1.0)
Monocytes Relative: 6 %
Neutro Abs: 5.2 10*3/uL (ref 1.7–7.7)
Neutrophils Relative %: 68 %
Platelets: 277 10*3/uL (ref 150–400)
RBC: 3.77 MIL/uL — ABNORMAL LOW (ref 3.87–5.11)
RDW: 12.5 % (ref 11.5–15.5)
WBC: 7.6 10*3/uL (ref 4.0–10.5)
nRBC: 0 % (ref 0.0–0.2)

## 2022-03-04 LAB — URINALYSIS, ROUTINE W REFLEX MICROSCOPIC
Bilirubin Urine: NEGATIVE
Glucose, UA: NEGATIVE mg/dL
Hgb urine dipstick: NEGATIVE
Ketones, ur: NEGATIVE mg/dL
Leukocytes,Ua: NEGATIVE
Nitrite: NEGATIVE
Protein, ur: NEGATIVE mg/dL
Specific Gravity, Urine: 1.008 (ref 1.005–1.030)
pH: 7 (ref 5.0–8.0)

## 2022-03-04 LAB — TROPONIN I (HIGH SENSITIVITY)
Troponin I (High Sensitivity): 9 ng/L (ref <18)
Troponin I (High Sensitivity): 11 ng/L (ref <18)

## 2022-03-04 LAB — BASIC METABOLIC PANEL
Anion gap: 6 (ref 5–15)
BUN: 16 mg/dL (ref 8–23)
CO2: 25 mmol/L (ref 22–32)
Calcium: 8.8 mg/dL — ABNORMAL LOW (ref 8.9–10.3)
Chloride: 109 mmol/L (ref 98–111)
Creatinine, Ser: 0.75 mg/dL (ref 0.44–1.00)
GFR, Estimated: >60 mL/min (ref >60)
Glucose, Bld: 118 mg/dL — ABNORMAL HIGH (ref 70–99)
Potassium: 4.1 mmol/L (ref 3.5–5.1)
Sodium: 140 mmol/L (ref 135–145)

## 2022-03-04 LAB — TSH: TSH: 1.503 u[IU]/mL (ref 0.350–4.500)

## 2022-03-04 LAB — MAGNESIUM: Magnesium: 1.9 mg/dL (ref 1.7–2.4)

## 2022-03-04 MED ORDER — ACETAMINOPHEN 325 MG PO TABS
650.0000 mg | ORAL_TABLET | Freq: Once | ORAL | Status: AC
  Administered 2022-03-04: 650 mg via ORAL
  Filled 2022-03-04: qty 2

## 2022-03-04 NOTE — ED Triage Notes (Signed)
Pt BIB EMS due to a syncopal episode. Pt does not have a cardiac hx but when EMS arrived pts HR was 110-160 and showed afib. Pt received fluid and patients HR went into the 30's. Pt is axox4 the whole time. VSS.  ?

## 2022-03-04 NOTE — ED Provider Notes (Signed)
?[facility] ?Provider Note ? ? ?CSN: [id number] ?Arrival date & time: 03/04/22  1125 ? ?  ? ?History ? ?Chief Complaint  ?Patient presents with  ? Loss of Consciousness  ? ? ?Toni Mcmillan is a 88 y.o. female.  Presented to emergency room due to LOC.  Patient reports that this morning while she was in her house she had an episode of passing out.  Does not recall any preceding symptoms, remembers going from a standing position and then being on the ground.  She initially had a hard time getting up but has been able to walk since the event.  She does not have any chest pain or difficulty in breathing.  Earlier this morning she states that she was feeling fine.  She denies a history of atrial fibrillation. ? ?Additional history obtained from daughters at bedside, patient is acting appropriately at baseline.  They do report that she recently lost her husband a couple months ago and is currently living alone. ? ?Per EMS report syncopal episode, heart rate 110 and 160 showing A-fib.  Given fluids.  No other meds in route. ? ?HPI ? ?  ? ?Home Medications ?Prior to Admission medications   ?Medication Sig Start Date End Date Taking? Authorizing Provider  ?aspirin EC 81 MG tablet Take 81 mg by mouth at bedtime.   Yes [provider]  ?aspirin-acetaminophen-caffeine (EXCEDRIN MIGRAINE) [phone] MG per tablet Take 1-2 tablets by mouth every 6 (six) hours as needed for headache (pain).   Yes [provider]  ?Cholecalciferol (VITAMIN D-3) 25 MCG (1000 UT) CAPS Take 1,000 Units by mouth daily.   Yes [provider]  ?donepezil (ARICEPT) 10 MG tablet Take 1 tablet (10 mg total) by mouth at bedtime. 01/06/22 01/06/23 Yes Plotnikov, Georgina QuintAleksei V, MD  ?LORazepam (ATIVAN) 0.5 MG tablet Take 1 tablet (0.5 mg total) by mouth at bedtime as needed for anxiety. 11/25/21  Yes Plotnikov, Georgina QuintAleksei V, MD  ?metoprolol tartrate (LOPRESSOR) 50 MG tablet TAKE 1 AND 1/2 BY MOUTH TWO  TIMES A DAY ?Patient taking differently: Take 75 mg by mouth 2 (two) times daily. 75 mg twice daily 02/15/22  Yes Plotnikov, Georgina QuintAleksei V, MD  ?Multiple Vitamins-Minerals (ONE-A-DAY WOMENS 50+) TABS Take 1 tablet by mouth daily with breakfast.   Yes [provider]  ?Omega-3 Fatty Acids (FISH OIL PO) Take 1,000 mg by mouth every morning.   Yes [provider]  ?pantoprazole (PROTONIX) 40 MG tablet Take 1 tablet (40 mg total) by mouth daily. ?Patient taking differently: Take 40 mg by mouth daily as needed (for heartburn). 12/11/19  Yes Plotnikov, Georgina QuintAleksei V, MD  ?Polyvinyl Alcohol-Povidone (REFRESH OP) Place 1-2 drops into both eyes daily as needed (dry eyes).   Yes [provider]  ?ramipril (ALTACE) 2.5 MG capsule TAKE ONE CAPSULE BY MOUTH DAILY ?Patient taking differently: Take 2.5 mg by mouth daily. 01/15/22  Yes Plotnikov, Georgina QuintAleksei V, MD  ?senna-docusate (SENOKOT-S) 8.6-50 MG tablet Take 2 tablets by mouth 2 (two) times daily. ?Patient taking differently: Take 1 tablet by mouth daily as needed for mild constipation. 11/25/21 11/25/22 Yes Plotnikov, Georgina QuintAleksei V, MD  ?simvastatin (ZOCOR) 40 MG tablet Take 0.5 tablets (20 mg total) by mouth daily at 6 PM. 11/25/21  Yes Plotnikov, Georgina QuintAleksei V, MD  ?triamcinolone cream (KENALOG) 0.1 % Apply 1 application. topically 2 (two) times daily. ?Patient taking differently: Apply 1 application. topically 2 (two) times daily as needed (itching). 02/12/22 02/12/23 Yes Corwin LevinsJohn, James W,  MD  ?vitamin C (ASCORBIC ACID) 500 MG tablet Take 500 mg by mouth daily.   Yes [provider]  ?Wheat Dextrin (BENEFIBER PO) Take by mouth See admin instructions. Mix 1 teaspoonful of powder into 4-8 ounces of water and drink once a day as needed for mild constipation   Yes [provider]  ?   ? ?Allergies    ?Celecoxib, Ciprofloxacin, Cleocin [clindamycin], Clindamycin hcl, Erythromycin, Gabapentin, Meperidine, Meperidine hcl, Morphine, Morphine sulfate, Tetracycline,  Triprolidine-pseudoephedrine, Zolpidem, and Latex   ? ?Review of Systems   ?Review of Systems  ?Constitutional:  Negative for chills and fever.  ?HENT:  Negative for ear pain and sore throat.   ?Eyes:  Negative for pain and visual disturbance.  ?Respiratory:  Negative for cough and shortness of breath.   ?Cardiovascular:  Negative for chest pain and palpitations.  ?Gastrointestinal:  Negative for abdominal pain and vomiting.  ?Genitourinary:  Negative for dysuria and hematuria.  ?Musculoskeletal:  Negative for arthralgias and back pain.  ?Skin:  Negative for color change and rash.  ?Neurological:  Positive for syncope. Negative for seizures.  ?All other systems reviewed and are negative. ? ?Physical Exam ?Updated Vital Signs ?BP 131/67   Pulse (!) 55   Temp 97.6 ?F (36.4 ?C) (Oral)   Resp 14   SpO2 100%  ?Physical Exam ?Vitals and nursing note reviewed.  ?Constitutional:   ?   General: She is not in acute distress. ?   Appearance: She is well-developed.  ?HENT:  ?   Head: Normocephalic and atraumatic.  ?Eyes:  ?   Conjunctiva/sclera: Conjunctivae normal.  ?Cardiovascular:  ?   Rate and Rhythm: Normal rate and regular rhythm.  ?   Heart sounds: No murmur heard. ?Pulmonary:  ?   Effort: Pulmonary effort is normal. No respiratory distress.  ?   Breath sounds: Normal breath sounds.  ?Abdominal:  ?   Palpations: Abdomen is soft.  ?   Tenderness: There is no abdominal tenderness.  ?Musculoskeletal:     ?   General: No swelling.  ?   Cervical back: Neck supple.  ?   Comments: Back: no C, T, L spine TTP, no step off or deformity ?RUE: no TTP throughout, no deformity, normal joint ROM, radial pulse intact, distal sensation and motor intact ?LUE: no TTP throughout, no deformity, normal joint ROM, radial pulse intact, distal sensation and motor intact ?RLE: There is mild tenderness in ecchymosis to her foot, normal joint ROM, distal pulse, sensation and motor intact ?LLE: no TTP throughout, no deformity, normal joint ROM,  distal pulse, sensation and motor intact  ?Skin: ?   General: Skin is warm and dry.  ?   Capillary Refill: Capillary refill takes less than 2 seconds.  ?Neurological:  ?   Mental Status: She is alert.  ?Psychiatric:     ?   Mood and Affect: Mood normal.  ? ? ?ED Results / Procedures / Treatments   ?Labs ?(all labs ordered are listed, but only abnormal results are displayed) ?Labs Reviewed  ?CBC WITH DIFFERENTIAL/PLATELET - Abnormal; Notable for the following components:  ?    Result Value  ? RBC 3.77 (*)   ? Hemoglobin 11.4 (*)   ? HCT 35.2 (*)   ? All other components within normal limits  ?BASIC METABOLIC PANEL - Abnormal; Notable for the following components:  ? Glucose, Bld 118 (*)   ? Calcium 8.8 (*)   ? All other components within normal limits  ?URINALYSIS, ROUTINE W  REFLEX MICROSCOPIC - Abnormal; Notable for the following components:  ? Color, Urine STRAW (*)   ? All other components within normal limits  ?MAGNESIUM  ?TSH  ?TROPONIN I (HIGH SENSITIVITY)  ?TROPONIN I (HIGH SENSITIVITY)  ? ? ?EKG ?EKG Interpretation ? ?Date/Time:  Thursday March 04 2022 11:28:53 EDT ?Ventricular Rate:  52 ?PR Interval:  221 ?QRS Duration: 97 ?QT Interval:  408 ?QTC Calculation: 380 ?R Axis:   -20 ?Text Interpretation: Sinus rhythm Prolonged PR interval Borderline left axis deviation Confirmed by Madalyn Rob [phone]) on 03/04/2022 11:51:15 AM ? ?Radiology ?DG Chest Portable 1 View ? ?Result Date: 03/04/2022 ?CLINICAL DATA:  Syncope. EXAM: PORTABLE CHEST 1 VIEW COMPARISON:  Chest x-ray January 29, 2021. FINDINGS: No consolidation. No visible pleural effusions or pneumothorax. Mild enlargement of the cardiac silhouette. No displaced fracture. IMPRESSION: 1. No evidence of acute abnormality. 2. Mild cardiomegaly. Electronically Signed   By: Margaretha Sheffield M.D.   On: 03/04/2022 12:26  ? ?DG Foot Complete Right ? ?Result Date: 03/04/2022 ?CLINICAL DATA:  Chronic right foot pain. EXAM: RIGHT FOOT COMPLETE - 3+ VIEW COMPARISON:   Right foot x-ray 08/19/2017. FINDINGS: There is no evidence for acute fracture or dislocation. Erosive changes at the fourth and fifth proximal interphalangeal joints are again seen similar to the prior study. Terie Purser

## 2022-03-04 NOTE — ED Provider Notes (Signed)
4:30 PM-checkout from Dr. Stevie Kern to evaluate patient after x-ray imaging to rule out foot fracture.  Patient seen earlier by him to be evaluated for syncope, which was associated with a potential cardiac arrhythmia.  Dr. Stevie Kern offered admission versus observation at home, and anticipated follow-up with A-fib clinic.  He also offered anticoagulation, which patient and family did not want to do. ? ?7:15 PM-right foot x-ray shows arthritis.  At this time, patient is calm and comfortable.  Daughter at bedside.  Findings discussed.  Patient still wants to go home and daughter is in agreement.  They will wait to talk to providers at the atrial fibrillation clinic, regarding anticoagulation.  Encouraged to take Tylenol for foot pain. ?  ?Mancel Bale, MD ?03/04/22 1927 ? ?

## 2022-03-04 NOTE — ED Notes (Signed)
Provider at bedside

## 2022-03-04 NOTE — Discharge Instructions (Signed)
We have sent a message to the atrial fibrillation clinic to call you for an appointment.  If you do not hear by noon on Monday, call them.  Make sure you are getting plenty of rest and drinking a lot of fluids and eating regularly.  For pain in your right foot use acetaminophen 650 mg every 4 hours.  Return here, if needed. ?

## 2022-03-04 NOTE — ED Notes (Signed)
Received verbal report from Victoria G RN at this time 

## 2022-03-05 ENCOUNTER — Emergency Department (HOSPITAL_BASED_OUTPATIENT_CLINIC_OR_DEPARTMENT_OTHER)
Admit: 2022-03-05 | Discharge: 2022-03-05 | Disposition: A | Discharge status: Home / Self Care | Payer: Medicare Other | Attending: Emergency Medicine | Admitting: Emergency Medicine

## 2022-03-05 ENCOUNTER — Encounter (HOSPITAL_COMMUNITY): Payer: Self-pay | Admitting: Emergency Medicine

## 2022-03-05 ENCOUNTER — Emergency Department (HOSPITAL_COMMUNITY)
Admission: EM | Admit: 2022-03-05 | Discharge: 2022-03-05 | Disposition: A | Discharge status: Home / Self Care | Payer: Medicare Other | Attending: Emergency Medicine | Admitting: Emergency Medicine

## 2022-03-05 ENCOUNTER — Other Ambulatory Visit: Payer: Self-pay

## 2022-03-05 ENCOUNTER — Ambulatory Visit (HOSPITAL_COMMUNITY)
Admission: EM | Admit: 2022-03-05 | Discharge: 2022-03-05 | Disposition: A | Discharge status: Home / Self Care | Payer: Medicare Other

## 2022-03-05 ENCOUNTER — Encounter (HOSPITAL_COMMUNITY): Payer: Self-pay

## 2022-03-05 ENCOUNTER — Emergency Department (HOSPITAL_COMMUNITY): Payer: Medicare Other

## 2022-03-05 DIAGNOSIS — M79671 Pain in right foot: Secondary | ICD-10-CM

## 2022-03-05 DIAGNOSIS — M7989 Other specified soft tissue disorders: Secondary | ICD-10-CM

## 2022-03-05 DIAGNOSIS — S99921A Unspecified injury of right foot, initial encounter: Secondary | ICD-10-CM | POA: Diagnosis present

## 2022-03-05 DIAGNOSIS — X58XXXA Exposure to other specified factors, initial encounter: Secondary | ICD-10-CM | POA: Diagnosis not present

## 2022-03-05 DIAGNOSIS — R079 Chest pain, unspecified: Secondary | ICD-10-CM | POA: Diagnosis not present

## 2022-03-05 DIAGNOSIS — S9001XA Contusion of right ankle, initial encounter: Secondary | ICD-10-CM | POA: Diagnosis not present

## 2022-03-05 DIAGNOSIS — I4891 Unspecified atrial fibrillation: Secondary | ICD-10-CM | POA: Insufficient documentation

## 2022-03-05 DIAGNOSIS — R600 Localized edema: Secondary | ICD-10-CM | POA: Diagnosis not present

## 2022-03-05 DIAGNOSIS — Z7982 Long term (current) use of aspirin: Secondary | ICD-10-CM | POA: Insufficient documentation

## 2022-03-05 DIAGNOSIS — Z79899 Other long term (current) drug therapy: Secondary | ICD-10-CM | POA: Diagnosis not present

## 2022-03-05 DIAGNOSIS — J9811 Atelectasis: Secondary | ICD-10-CM | POA: Diagnosis not present

## 2022-03-05 DIAGNOSIS — M79605 Pain in left leg: Secondary | ICD-10-CM | POA: Diagnosis not present

## 2022-03-05 DIAGNOSIS — Z91040 Latex allergy status: Secondary | ICD-10-CM | POA: Diagnosis not present

## 2022-03-05 DIAGNOSIS — M79661 Pain in right lower leg: Secondary | ICD-10-CM

## 2022-03-05 DIAGNOSIS — M25571 Pain in right ankle and joints of right foot: Secondary | ICD-10-CM | POA: Diagnosis not present

## 2022-03-05 DIAGNOSIS — R001 Bradycardia, unspecified: Secondary | ICD-10-CM | POA: Diagnosis not present

## 2022-03-05 DIAGNOSIS — K8020 Calculus of gallbladder without cholecystitis without obstruction: Secondary | ICD-10-CM | POA: Diagnosis not present

## 2022-03-05 LAB — BASIC METABOLIC PANEL
Anion gap: 9 (ref 5–15)
BUN: 13 mg/dL (ref 8–23)
CO2: 24 mmol/L (ref 22–32)
Calcium: 9.2 mg/dL (ref 8.9–10.3)
Chloride: 103 mmol/L (ref 98–111)
Creatinine, Ser: 0.81 mg/dL (ref 0.44–1.00)
GFR, Estimated: >60 mL/min (ref >60)
Glucose, Bld: 103 mg/dL — ABNORMAL HIGH (ref 70–99)
Potassium: 3.8 mmol/L (ref 3.5–5.1)
Sodium: 136 mmol/L (ref 135–145)

## 2022-03-05 LAB — HEPATIC FUNCTION PANEL
ALT: 12 U/L (ref 0–44)
AST: 20 U/L (ref 15–41)
Albumin: 3.6 g/dL (ref 3.5–5.0)
Alkaline Phosphatase: 58 U/L (ref 38–126)
Bilirubin, Direct: 0.1 mg/dL (ref 0.0–0.2)
Indirect Bilirubin: 0.8 mg/dL (ref 0.3–0.9)
Total Bilirubin: 0.9 mg/dL (ref 0.3–1.2)
Total Protein: 7.1 g/dL (ref 6.5–8.1)

## 2022-03-05 LAB — CBC
HCT: 35.7 % — ABNORMAL LOW (ref 36.0–46.0)
Hemoglobin: 11.5 g/dL — ABNORMAL LOW (ref 12.0–15.0)
MCH: 30.2 pg (ref 26.0–34.0)
MCHC: 32.2 g/dL (ref 30.0–36.0)
MCV: 93.7 fL (ref 80.0–100.0)
Platelets: 276 10*3/uL (ref 150–400)
RBC: 3.81 MIL/uL — ABNORMAL LOW (ref 3.87–5.11)
RDW: 12.6 % (ref 11.5–15.5)
WBC: 10.8 10*3/uL — ABNORMAL HIGH (ref 4.0–10.5)
nRBC: 0 % (ref 0.0–0.2)

## 2022-03-05 LAB — TROPONIN I (HIGH SENSITIVITY): Troponin I (High Sensitivity): 11 ng/L (ref <18)

## 2022-03-05 LAB — D-DIMER, QUANTITATIVE: D-Dimer, Quant: 2.46 ug/mL-FEU — ABNORMAL HIGH (ref 0.00–0.50)

## 2022-03-05 LAB — BRAIN NATRIURETIC PEPTIDE: B Natriuretic Peptide: 119.2 pg/mL — ABNORMAL HIGH (ref 0.0–100.0)

## 2022-03-05 MED ORDER — IOHEXOL 350 MG/ML SOLN
100.0000 mL | Freq: Once | INTRAVENOUS | Status: AC | PRN
Start: 2022-03-05 — End: 2022-03-05
  Administered 2022-03-05: 100 mL via INTRAVENOUS

## 2022-03-05 NOTE — ED Provider Notes (Signed)
?[facility] ? ? ? ?CSN: [id number] ?Arrival date & time: 03/05/22  1535 ? ? ?  ? ?History   ?Chief Complaint ?Chief Complaint  ?Patient presents with  ? Foot Pain  ? ? ?HPI ?Toni Mcmillan is a 88 y.o. female.  ? ?Patient presents with right foot and leg pain that has been worsening over the past few days.  Patient and daughter at bedside report that patient had a fall yesterday and was evaluated in the emergency department.  She was found to be in A-fib that was thought to be the cause of the fall.  Blood thinners were not started yesterday.  She did have some foot pain after the fall yesterday and an x-ray was completed that was negative for any fracture but did show signs of degenerative and chronic changes.  Patient reports today due to the foot pain becoming worse.  She does report that she has been having some foot and calf pain that has been present over the past 6 months.  She reports that she has tingling in the calf and the pain radiates up to posterior knee.  Pain is worsened with bearing weight.  Daughter and patient are concerned for blood clot given recent diagnosis of atrial fibrillation and would like ultrasound completed. ? ? ?Foot Pain ? ? ?Past Medical History:  ?Diagnosis Date  ? Allergic rhinitis, cause unspecified   ? Diabetes mellitus without complication (HCC)   ? borderline"never on meds"  ? Disturbances of sensation of smell and taste   ? Dizziness   ? Headache(784.0)   ? has decreased to none in later years  ? Insomnia, unspecified   ? Other and unspecified hyperlipidemia   ? Personal history of other diseases of digestive system   ? Scarlet fever   ? Unspecified essential hypertension   ? Metoprolol decreased due to drop in BP readings.  ? Unspecified sinusitis (chronic)   ? Whooping cough, unspecified organism   ? ? ?Patient Active Problem List  ? Diagnosis Date Noted  ? Nail disorder 02/12/2022  ? Insect bite of abdomen, infected 02/12/2022  ? Grief 01/06/2022  ? Memory  change 02/10/2021  ? Sinus bradycardia 01/29/2021  ? Peripheral neuropathy 09/11/2020  ? Chronic left-sided thoracic back pain 12/11/2019  ? RLQ abdominal pain 05/29/2018  ? GERD (gastroesophageal reflux disease) 01/11/2018  ? Dry eyes 11/24/2017  ? Toe pain 08/16/2017  ? Allergic conjunctivitis, bilateral 03/24/2017  ? Pseudophakia of both eyes 03/24/2017  ? Foot pain, bilateral 01/10/2017  ? Acute cystitis 11/27/2016  ? Bilateral impacted cerumen 08/16/2016  ? Postural vertigo 08/16/2016  ? Dyspnea on exertion 05/07/2016  ? Palpitations 05/07/2016  ? Dizziness 03/30/2016  ? 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 Constipation 11/19/2015  ? 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 (HCC) 09/11/2015  ? Paresthesia of both feet 06/04/2015  ? Abdominal bloating 04/23/2015  ? Stool incontinence 04/23/2015  ? Blood in stool 04/23/2015  ? Well adult exam 07/11/2014  ? Syncope and collapse 05/09/2014  ? 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 Allergic conjunctivitis 03/22/2013  ? Vitreomacular adhesion of both eyes 03/22/2013  ? Status post intraocular lens implant 06/22/2012  ? Diabetic macular edema of left eye (HCC) 09/23/2011  ? Nonproliferative diabetic retinopathy (HCC) 09/23/2011  ? Type 2 diabetes, controlled, with neuropathy (HCC) 12/16/2009  ? Dyslipidemia 10/30/2007  ? Essential hypertension 10/30/2007  ? SINUSITIS, CHRONIC 10/30/2007  ? ALLERGIC RHINITIS 10/30/2007  ?  Insomnia 10/30/2007  ? ANOSMIA 10/30/2007  ? DYSPEPSIA, HX OF 10/30/2007  ? CATARACT EXTRACTIONS, BILATERAL, HX OF 10/30/2007  ? ? ?Past Surgical History:  ?Procedure Laterality Date  ? ABDOMINAL HYSTERECTOMY    ? ANAL RECTAL MANOMETRY N/A 07/07/2015  ? Procedure: ANO RECTAL MANOMETRY;  Surgeon: Charolett Bumpers, MD;  Location: [facility];  Service: Endoscopy;  Laterality: N/A;  ? BILATERAL OOPHORECTOMY    ? CATARACT EXTRACTION, BILATERAL    ? COLONOSCOPY W/ POLYPECTOMY     ? COLONOSCOPY WITH PROPOFOL N/A 05/29/2015  ? Procedure: COLONOSCOPY WITH PROPOFOL;  Surgeon: Charolett Bumpers, MD;  Location: [facility];  Service: Endoscopy;  Laterality: N/A;  ? correction of hammertoe deformity    ? CYSTOSCOPY    ? w/ stenting of both ureters  ? HEMORRHOID SURGERY    ? hydrosalpinx diagnostic laparoscopy    ? TONSILLECTOMY  age 88  ? vericose vein surgery    ? ? ?OB History   ?No obstetric history on file. ?  ? ? ? ?Home Medications   ? ?Prior to Admission medications   ?Medication Sig Start Date End Date Taking? Authorizing Provider  ?aspirin EC 81 MG tablet Take 81 mg by mouth at bedtime.    [provider]  ?aspirin-acetaminophen-caffeine (EXCEDRIN MIGRAINE) [phone] MG per tablet Take 1-2 tablets by mouth every 6 (six) hours as needed for headache (pain).    [provider]  ?Cholecalciferol (VITAMIN D-3) 25 MCG (1000 UT) CAPS Take 1,000 Units by mouth daily.    [provider]  ?donepezil (ARICEPT) 10 MG tablet Take 1 tablet (10 mg total) by mouth at bedtime. 01/06/22 01/06/23  Plotnikov, Georgina Quint, MD  ?LORazepam (ATIVAN) 0.5 MG tablet Take 1 tablet (0.5 mg total) by mouth at bedtime as needed for anxiety. 11/25/21   Plotnikov, Georgina Quint, MD  ?metoprolol tartrate (LOPRESSOR) 50 MG tablet TAKE 1 AND 1/2 BY MOUTH TWO TIMES A DAY ?Patient taking differently: Take 75 mg by mouth 2 (two) times daily. 75 mg twice daily 02/15/22   Plotnikov, Georgina Quint, MD  ?Multiple Vitamins-Minerals (ONE-A-DAY WOMENS 50+) TABS Take 1 tablet by mouth daily with breakfast.    [provider]  ?Omega-3 Fatty Acids (FISH OIL PO) Take 1,000 mg by mouth every morning.    [provider]  ?pantoprazole (PROTONIX) 40 MG tablet Take 1 tablet (40 mg total) by mouth daily. ?Patient taking differently: Take 40 mg by mouth daily as needed (for heartburn). 12/11/19   Plotnikov, Georgina Quint, MD  ?Polyvinyl Alcohol-Povidone (REFRESH OP) Place 1-2 drops into both eyes daily as needed (dry  eyes).    [provider]  ?ramipril (ALTACE) 2.5 MG capsule TAKE ONE CAPSULE BY MOUTH DAILY ?Patient taking differently: Take 2.5 mg by mouth daily. 01/15/22   Plotnikov, Georgina Quint, MD  ?senna-docusate (SENOKOT-S) 8.6-50 MG tablet Take 2 tablets by mouth 2 (two) times daily. ?Patient taking differently: Take 1 tablet by mouth daily as needed for mild constipation. 11/25/21 11/25/22  Plotnikov, Georgina Quint, MD  ?simvastatin (ZOCOR) 40 MG tablet Take 0.5 tablets (20 mg total) by mouth daily at 6 PM. 11/25/21   Plotnikov, Georgina Quint, MD  ?triamcinolone cream (KENALOG) 0.1 % Apply 1 application. topically 2 (two) times daily. ?Patient taking differently: Apply 1 application. topically 2 (two) times daily as needed (itching). 02/12/22 02/12/23  Corwin Levins, MD  ?vitamin C (ASCORBIC ACID) 500 MG tablet Take 500 mg by mouth daily.    [provider]  ?  Wheat Dextrin (BENEFIBER PO) Take by mouth See admin instructions. Mix 1 teaspoonful of powder into 4-8 ounces of water and drink once a day as needed for mild constipation    [provider]  ? ? ?Family History ?Family History  ?Problem Relation Age of Onset  ? Cancer Mother   ?     ?  ? Diabetes Mother   ? Other Mother   ?     coagulopathy/ ruptured viscus  ? Hypertension Father   ? Hyperlipidemia Father   ?     ?  ? Other Father   ?     CVA  ? Cancer Paternal Grandmother   ?     breast  ? ? ?Social History ?Social History  ? ?Tobacco Use  ? Smoking status: Never  ? Smokeless tobacco: Never  ?Substance Use Topics  ? Alcohol use: No  ?  Alcohol/week: 0.0 standard drinks  ? Drug use: No  ? ? ? ?Allergies   ?Celecoxib, Ciprofloxacin, Cleocin [clindamycin], Clindamycin hcl, Erythromycin, Gabapentin, Meperidine, Meperidine hcl, Morphine, Morphine sulfate, Tetracycline, Triprolidine-pseudoephedrine, Zolpidem, and Latex ? ? ?Review of Systems ?Review of Systems ?Per HPI ? ?Physical Exam ?Triage Vital Signs ?ED Triage Vitals  ?Enc Vitals Group  ?   BP 03/05/22  1553 116/63  ?   Pulse Rate 03/05/22 1553 61  ?   Resp 03/05/22 1553 20  ?   Temp 03/05/22 1553 97.6 ?F (36.4 ?C)  ?   Temp Source 03/05/22 1553 Oral  ?   SpO2 03/05/22 1553 97 %  ?   Weight --   ?   H

## 2022-03-05 NOTE — ED Triage Notes (Signed)
Pt having right foot pain that radiates up her right leg for about 6 months. Reports that had syncopal episode yesterday and was seen in ED where did xray on right foot yesterday that didn't show a fracture. Today having more pain and some swelling. Pain is worse with weight bearing.   ?Family reports that pt having a fib and not on any blood thinners concerned for blood clot.  ?

## 2022-03-05 NOTE — ED Notes (Signed)
E-signature pad unavailable at time of pt discharge. This RN discussed discharge materials with pt and answered all pt questions. Pt stated understanding of discharge material. ? ?

## 2022-03-05 NOTE — ED Notes (Signed)
Patient does not want an x-ray done, pt states they came here for an ultrasound and do not want an xray. Pt also states that she had xrays done yesterday at our facility.  ?

## 2022-03-05 NOTE — ED Triage Notes (Signed)
Pt was sent by [facility] to get further work up of her right foot pain. Pt states over the past 1-2 mo her right foot has gotten more swollen. Pt states the calf area of right leg was hurting her. Pt has 2+ swelling of right ankle and 1+ swelling of right foot. Pt has 2+ right radial pulse, cap refill less than 3 sec, warm to touch, sensation intact. When I was taking pt's shoe off, pt yelled in pain. ?

## 2022-03-05 NOTE — ED Provider Notes (Signed)
?[facility] EMERGENCY DEPARTMENT ?Provider Note ? ? ?CSN: [id number] ?Arrival date & time: 03/05/22  1623 ? ?  ? ?History ? ?Chief Complaint  ?Patient presents with  ? Foot Swelling  ? ? ?Toni Mcmillan is a 88 y.o. female. ? ?HPI ? ?Patient is a 88 year old female with multiple medical history as listed below and recent evaluation after syncopal episode and left lower extremity/foot swelling who presents to the emergency department from urgent care due to concern for right-sided foot and lower leg pain.  Patient reports she went to urgent care because she experienced worsening calf pain last night.  She describes right-sided leg pain for the past 6 months.  She reported her pain gets worse with movement or bearing weight.  Family member are concerned about blood clot given her recent diagnosis of atrial fibrillation.  They report that she has not started her blood thinner medication that was prescribed to her.  She currently denies chest pain or shortness of breath.  Denies abdominal pain.  Denies fever, nausea, vomiting, or diarrhea.  Otherwise no other complaints. ? ?Home Medications ?Prior to Admission medications   ?Medication Sig Start Date End Date Taking? Authorizing Provider  ?aspirin EC 81 MG tablet Take 81 mg by mouth at bedtime.    [provider]  ?aspirin-acetaminophen-caffeine (EXCEDRIN MIGRAINE) [phone] MG per tablet Take 1-2 tablets by mouth every 6 (six) hours as needed for headache (pain).    [provider]  ?Cholecalciferol (VITAMIN D-3) 25 MCG (1000 UT) CAPS Take 1,000 Units by mouth daily.    [provider]  ?donepezil (ARICEPT) 10 MG tablet Take 1 tablet (10 mg total) by mouth at bedtime. 01/06/22 01/06/23  Plotnikov, Georgina Quint, MD  ?LORazepam (ATIVAN) 0.5 MG tablet Take 1 tablet (0.5 mg total) by mouth at bedtime as needed for anxiety. 11/25/21   Plotnikov, Georgina Quint, MD  ?metoprolol tartrate (LOPRESSOR) 50 MG tablet TAKE 1 AND 1/2 BY MOUTH TWO  TIMES A DAY ?Patient taking differently: Take 75 mg by mouth 2 (two) times daily. 75 mg twice daily 02/15/22   Plotnikov, Georgina Quint, MD  ?Multiple Vitamins-Minerals (ONE-A-DAY WOMENS 50+) TABS Take 1 tablet by mouth daily with breakfast.    [provider]  ?Omega-3 Fatty Acids (FISH OIL PO) Take 1,000 mg by mouth every morning.    [provider]  ?pantoprazole (PROTONIX) 40 MG tablet Take 1 tablet (40 mg total) by mouth daily. ?Patient taking differently: Take 40 mg by mouth daily as needed (for heartburn). 12/11/19   Plotnikov, Georgina Quint, MD  ?Polyvinyl Alcohol-Povidone (REFRESH OP) Place 1-2 drops into both eyes daily as needed (dry eyes).    [provider]  ?ramipril (ALTACE) 2.5 MG capsule TAKE ONE CAPSULE BY MOUTH DAILY ?Patient taking differently: Take 2.5 mg by mouth daily. 01/15/22   Plotnikov, Georgina Quint, MD  ?senna-docusate (SENOKOT-S) 8.6-50 MG tablet Take 2 tablets by mouth 2 (two) times daily. ?Patient taking differently: Take 1 tablet by mouth daily as needed for mild constipation. 11/25/21 11/25/22  Plotnikov, Georgina Quint, MD  ?simvastatin (ZOCOR) 40 MG tablet Take 0.5 tablets (20 mg total) by mouth daily at 6 PM. 11/25/21   Plotnikov, Georgina Quint, MD  ?triamcinolone cream (KENALOG) 0.1 % Apply 1 application. topically 2 (two) times daily. ?Patient taking differently: Apply 1 application. topically 2 (two) times daily as needed (itching). 02/12/22 02/12/23  Corwin Levins, MD  ?vitamin C (ASCORBIC ACID) 500 MG tablet Take 500 mg by mouth  daily.    [provider]  ?Wheat Dextrin (BENEFIBER PO) Take by mouth See admin instructions. Mix 1 teaspoonful of powder into 4-8 ounces of water and drink once a day as needed for mild constipation    [provider]  ?   ? ?Allergies    ?Celecoxib, Ciprofloxacin, Cleocin [clindamycin], Clindamycin hcl, Erythromycin, Gabapentin, Meperidine, Meperidine hcl, Morphine, Morphine sulfate, Tetracycline, Triprolidine-pseudoephedrine,  Zolpidem, and Latex   ? ?Review of Systems   ?Review of Systems ? ?Physical Exam ?Updated Vital Signs ?BP 112/69 (BP Location: Left Arm)   Pulse 97   Temp 98.7 ?F (37.1 ?C) (Oral)   Resp 17   Ht 5\' 2"  (1.575 m)   Wt 52.2 kg   SpO2 96%   BMI 21.05 kg/m?  ?Physical Exam ?Vitals and nursing note reviewed.  ?Constitutional:   ?   General: She is not in acute distress. ?   Appearance: She is well-developed.  ?HENT:  ?   Head: Normocephalic and atraumatic.  ?Eyes:  ?   Conjunctiva/sclera: Conjunctivae normal.  ?Cardiovascular:  ?   Rate and Rhythm: Normal rate and regular rhythm.  ?   Heart sounds: No murmur heard. ?Pulmonary:  ?   Effort: Pulmonary effort is normal. No respiratory distress.  ?   Breath sounds: Normal breath sounds.  ?Abdominal:  ?   Palpations: Abdomen is soft.  ?   Tenderness: There is no abdominal tenderness.  ?Musculoskeletal:     ?   General: Swelling and tenderness present. No signs of injury.  ?   Cervical back: Neck supple.  ?   Comments: Right lateral foot tenderness and bruising.  Patient is neurovascularly intact distally.  Range of motion intact.  ?Skin: ?   General: Skin is warm and dry.  ?   Capillary Refill: Capillary refill takes less than 2 seconds.  ?Neurological:  ?   General: No focal deficit present.  ?   Mental Status: She is alert.  ?   Sensory: No sensory deficit.  ?Psychiatric:     ?   Mood and Affect: Mood normal.  ? ? ?ED Results / Procedures / Treatments   ?Labs ?(all labs ordered are listed, but only abnormal results are displayed) ?Labs Reviewed  ?BRAIN NATRIURETIC PEPTIDE - Abnormal; Notable for the following components:  ?    Result Value  ? B Natriuretic Peptide 119.2 (*)   ? All other components within normal limits  ?CBC - Abnormal; Notable for the following components:  ? WBC 10.8 (*)   ? RBC 3.81 (*)   ? Hemoglobin 11.5 (*)   ? HCT 35.7 (*)   ? All other components within normal limits  ?BASIC METABOLIC PANEL - Abnormal; Notable for the following components:  ?  Glucose, Bld 103 (*)   ? All other components within normal limits  ?D-DIMER, QUANTITATIVE - Abnormal; Notable for the following components:  ? D-Dimer, Quant 2.46 (*)   ? All other components within normal limits  ?HEPATIC FUNCTION PANEL  ?TROPONIN I (HIGH SENSITIVITY)  ? ? ?EKG ?EKG Interpretation ? ?Date/Time:  Friday March 05 2022 19:44:12 EDT ?Ventricular Rate:  53 ?PR Interval:  204 ?QRS Duration: 88 ?QT Interval:  438 ?QTC Calculation: 410 ?R Axis:   3 ?Text Interpretation: Sinus bradycardia Septal infarct , age undetermined Abnormal ECG When compared with ECG of 04-Mar-2022 11:28, PREVIOUS ECG IS PRESENT No significant change since last tracing Confirmed by 06-Mar-2022 [phone]) on 03/05/2022 8:02:33 PM ? ?Radiology ?CT Angio  Chest PE W and/or Wo Contrast ? ?Result Date: 03/05/2022 ?CLINICAL DATA:  Elevated D-dimer. EXAM: CT ANGIOGRAPHY CHEST WITH CONTRAST TECHNIQUE: Multidetector CT imaging of the chest was performed using the standard protocol during bolus administration of intravenous contrast. Multiplanar CT image reconstructions and MIPs were obtained to evaluate the vascular anatomy. RADIATION DOSE REDUCTION: This exam was performed according to the departmental dose-optimization program which includes automated exposure control, adjustment of the mA and/or kV according to patient size and/or use of iterative reconstruction technique. CONTRAST:  100mL OMNIPAQUE IOHEXOL 350 MG/ML SOLN COMPARISON:  None. FINDINGS: Cardiovascular: There is moderate to marked severity calcification of the thoracic aorta. The ascending thoracic aorta measures approximately 4.1 cm in diameter. Satisfactory opacification of the pulmonary arteries to the segmental level. No evidence of pulmonary embolism. Normal heart size with marked severity coronary artery calcification. No pericardial effusion. Mediastinum/Nodes: No enlarged mediastinal, hilar, or axillary lymph nodes. Thyroid gland, trachea, and esophagus demonstrate no  significant findings. Lungs/Pleura: Mild atelectasis is seen along the posterior aspect of the bilateral lower lobes. There is no evidence of acute infiltrate, pleural effusion or pneumothorax. Upper Abdomen: Multiple

## 2022-03-05 NOTE — ED Notes (Signed)
Pt refused to get the x-ray. Per family she had one done yesterday and they want an ultrasound to check for blood clots.  ?

## 2022-03-05 NOTE — Progress Notes (Signed)
Right lower extremity venous duplex completed. ?Refer to "CV Proc" under chart review to view preliminary results. ? ?03/05/2022 6:49 PM ?Eula Fried., MHA, RVT, RDCS, RDMS   ?

## 2022-03-05 NOTE — ED Notes (Signed)
Patient transported to Ultrasound 

## 2022-03-05 NOTE — Discharge Instructions (Signed)
Your CT of the chest did not show clot.  However it did show coronary vessel disease.  You will require follow-up with your primary care provider for further evaluation and work-up. ? ?Continue to take Tylenol as needed for pain.  It is important that you follow-up with your primary care provider or cardiologist for further evaluation. ? ?

## 2022-03-05 NOTE — Discharge Instructions (Signed)
Go to the emergency department as soon as you leave urgent care for further evaluation and management. 

## 2022-03-08 ENCOUNTER — Ambulatory Visit (HOSPITAL_COMMUNITY)
Admission: RE | Admit: 2022-03-08 | Discharge: 2022-03-08 | Disposition: A | Discharge status: Home / Self Care | Payer: Medicare Other | Source: Ambulatory Visit | Attending: Physician Assistant | Admitting: Physician Assistant

## 2022-03-08 ENCOUNTER — Encounter (HOSPITAL_COMMUNITY): Payer: Self-pay | Admitting: Physician Assistant

## 2022-03-08 VITALS — BP 138/82 | HR 54 | Ht 62.0 in | Wt 113.2 lb

## 2022-03-08 DIAGNOSIS — I2510 Atherosclerotic heart disease of native coronary artery without angina pectoris: Secondary | ICD-10-CM | POA: Insufficient documentation

## 2022-03-08 DIAGNOSIS — D6869 Other thrombophilia: Secondary | ICD-10-CM | POA: Diagnosis not present

## 2022-03-08 DIAGNOSIS — I10 Essential (primary) hypertension: Secondary | ICD-10-CM | POA: Diagnosis not present

## 2022-03-08 DIAGNOSIS — Z79899 Other long term (current) drug therapy: Secondary | ICD-10-CM | POA: Diagnosis not present

## 2022-03-08 DIAGNOSIS — Z7901 Long term (current) use of anticoagulants: Secondary | ICD-10-CM | POA: Diagnosis not present

## 2022-03-08 DIAGNOSIS — I480 Paroxysmal atrial fibrillation: Secondary | ICD-10-CM | POA: Diagnosis not present

## 2022-03-08 MED ORDER — APIXABAN 2.5 MG PO TABS: 2.5000 mg | ORAL_TABLET | Freq: Two times a day (BID) | ORAL | 3 refills | Status: DC

## 2022-03-08 NOTE — Patient Instructions (Signed)
Stop aspirin ? ?Start Eliquis 2.5mg twice a day ?

## 2022-03-08 NOTE — Progress Notes (Signed)
? ? ?Primary Care Physician: Cassandria Anger, MD ?Primary Cardiologist: Dr Gwenlyn Found (remotely) ?Primary Electrophysiologist: none ?Referring Physician: [facility] ? ? ?Toni Mcmillan is a 88 y.o. female with a history of DM, HTN, CAD, atrial fibrillation who presents for consultation in the [facility].  The patient was initially diagnosed with atrial fibrillation 03/04/22 after presenting to the ED with symptoms of "syncope". She states she never lost consciousness, just felt "strange" and weak. Per EMS report she was in afib with heart rates between 110-160 bpm. She had converted to SR en route to ED. She saw Dr Gwenlyn Found 03/2016 for dizziness and irregular pulse, event monitor at that time showed only PVCs. Patient and family declined anticoagulation at that time. She has a CHADS2VASC score of 6. She returned to the ED the next day for leg pain and concern for DVT. Workup negative for DVT or PE, she was in SR. CT of her chest did showed "marked severity of CAD". Today, patient is feeling well in SR. She is concerned about the bruising on her R foot although she does say this is improving. X-ray on her foot at the ED was negative for fracture.  ? ?Today, she denies symptoms of palpitations, chest pain, shortness of breath, orthopnea, PND, lower extremity edema, dizziness, presyncope, syncope, snoring, daytime somnolence, bleeding, or neurologic sequela. The patient is tolerating medications without difficulties and is otherwise without complaint today.  ? ? ?Atrial Fibrillation Risk Factors: ? ?she does not have symptoms or diagnosis of sleep apnea. ?she does not have a history of rheumatic fever. ?The patient does not have a history of early familial atrial fibrillation or other arrhythmias. ? ?she has a BMI of Body mass index is 20.7 kg/m?Marland KitchenMarland Kitchen ?Filed Weights  ? 03/08/22 1428  ?Weight: 51.3 kg  ? ? ?Family History  ?Problem Relation Age of Onset  ? Cancer Mother   ?     ?  ? Diabetes  Mother   ? Other Mother   ?     coagulopathy/ ruptured viscus  ? Hypertension Father   ? Hyperlipidemia Father   ?     ?  ? Other Father   ?     CVA  ? Cancer Paternal Grandmother   ?     breast  ? ? ? ?Atrial Fibrillation Management history: ? ?Previous antiarrhythmic drugs: none ?Previous cardioversions: none ?Previous ablations: none ?CHADS2VASC score: 6 ?Anticoagulation history: none ? ? ?Past Medical History:  ?Diagnosis Date  ? Allergic rhinitis, cause unspecified   ? Diabetes mellitus without complication ([place])   ? borderline"never on meds"  ? Disturbances of sensation of smell and taste   ? Dizziness   ? Headache(784.0)   ? has decreased to none in later years  ? Insomnia, unspecified   ? Other and unspecified hyperlipidemia   ? Personal history of other diseases of digestive system   ? Scarlet fever   ? Unspecified essential hypertension   ? Metoprolol decreased due to drop in BP readings.  ? Unspecified sinusitis (chronic)   ? Whooping cough, unspecified organism   ? ?Past Surgical History:  ?Procedure Laterality Date  ? ABDOMINAL HYSTERECTOMY    ? ANAL RECTAL MANOMETRY N/A 07/07/2015  ? Procedure: ANO RECTAL MANOMETRY;  Surgeon: Garlan Fair, MD;  Location: [facility];  Service: Endoscopy;  Laterality: N/A;  ? BILATERAL OOPHORECTOMY    ? CATARACT EXTRACTION, BILATERAL    ? COLONOSCOPY W/ POLYPECTOMY    ?  COLONOSCOPY WITH PROPOFOL N/A 05/29/2015  ? Procedure: COLONOSCOPY WITH PROPOFOL;  Surgeon: Garlan Fair, MD;  Location: [facility]COPY;  Service: Endoscopy;  Laterality: N/A;  ? correction of hammertoe deformity    ? CYSTOSCOPY    ? w/ stenting of both ureters  ? HEMORRHOID SURGERY    ? hydrosalpinx diagnostic laparoscopy    ? TONSILLECTOMY  age 8  ? vericose vein surgery    ? ? ?Current Outpatient Medications  ?Medication Sig Dispense Refill  ? apixaban (ELIQUIS) 2.5 MG TABS tablet Take 1 tablet (2.5 mg total) by mouth 2 (two) times daily. 60 tablet 3  ? aspirin-acetaminophen-caffeine (EXCEDRIN  MIGRAINE) [id number] MG per tablet Take 1-2 tablets by mouth every 6 (six) hours as needed for headache (pain).    ? Cholecalciferol (VITAMIN D-3) 25 MCG (1000 UT) CAPS Take 1,000 Units by mouth daily.    ? donepezil (ARICEPT) 10 MG tablet Take 1 tablet (10 mg total) by mouth at bedtime. 90 tablet 3  ? LORazepam (ATIVAN) 0.5 MG tablet Take 1 tablet (0.5 mg total) by mouth at bedtime as needed for anxiety. 30 tablet 5  ? metoprolol tartrate (LOPRESSOR) 50 MG tablet TAKE 1 AND 1/2 BY MOUTH TWO TIMES A DAY 270 tablet 1  ? Multiple Vitamins-Minerals (ONE-A-DAY WOMENS 50+) TABS Take 1 tablet by mouth daily with breakfast.    ? Omega-3 Fatty Acids (FISH OIL PO) Take 1,000 mg by mouth every morning.    ? pantoprazole (PROTONIX) 40 MG tablet Take 1 tablet (40 mg total) by mouth daily. 90 tablet 3  ? Polyvinyl Alcohol-Povidone (REFRESH OP) Place 1-2 drops into both eyes daily as needed (dry eyes).    ? ramipril (ALTACE) 2.5 MG capsule TAKE ONE CAPSULE BY MOUTH DAILY 90 capsule 3  ? senna-docusate (SENOKOT-S) 8.6-50 MG tablet Take 2 tablets by mouth 2 (two) times daily. 60 tablet 5  ? simvastatin (ZOCOR) 40 MG tablet Take 0.5 tablets (20 mg total) by mouth daily at 6 PM. 30 tablet 11  ? vitamin C (ASCORBIC ACID) 500 MG tablet Take 500 mg by mouth daily.    ? Wheat Dextrin (BENEFIBER PO) Take by mouth See admin instructions. Mix 1 teaspoonful of powder into 4-8 ounces of water and drink once a day as needed for mild constipation    ? ?No current facility-administered medications for this encounter.  ? ? ?Allergies  ?Allergen Reactions  ? Celecoxib Other (See Comments)  ?  Reaction not recalled   ? Ciprofloxacin Swelling and Other (See Comments)  ?  Caused wrist swelling  ? Cleocin [Clindamycin] Itching  ? Clindamycin Hcl Itching  ? Erythromycin Other (See Comments)  ?  GI upset  ? Gabapentin Other (See Comments)  ?  Made the patient feel not well the next day  ? Meperidine Itching  ? Meperidine Hcl Itching  ? Morphine Itching   ? Morphine Sulfate Itching  ? Tetracycline Other (See Comments)  ?  GI upset  ? Triprolidine-Pseudoephedrine Other (See Comments)  ?  Reaction not recalled  ? Zolpidem Other (See Comments)  ?  "Did not have a good sleep"  ? Latex Itching and Rash  ? ? ?Social History  ? ?Socioeconomic History  ? Marital status: Married  ?  Spouse name: Not on file  ? Number of children: Not on file  ? Years of education: Not on file  ? Highest education level: Not on file  ?Occupational History  ? Not on file  ?Tobacco Use  ?  Smoking status: Never  ? Smokeless tobacco: Never  ? Tobacco comments:  ?  Never smoke 03/08/22  ?Substance and Sexual Activity  ? Alcohol use: No  ?  Alcohol/week: 0.0 standard drinks  ? Drug use: No  ? Sexual activity: Not on file  ?Other Topics Concern  ? Not on file  ?Social History Narrative  ? 7 grand children, 4 great-grandchildren  ? 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 Death of step- grandson by suicide- 9-10  ? ?Social Determinants of Health  ? ?Financial Resource Strain: Not on file  ?Food Insecurity: Not on file  ?Transportation Needs: Not on file  ?Physical Activity: Not on file  ?Stress: Not on file  ?Social Connections: Not on file  ?Intimate Partner Violence: Not on file  ? ? ? ?ROS- All systems are reviewed and negative except as per the HPI above. ? ?Physical Exam: ?Vitals:  ? 03/08/22 1428  ?BP: 138/82  ?Pulse: (!) 54  ?Weight: 51.3 kg  ?Height: 5\' 2"  (1.575 m)  ? ? ?GEN- The patient is a well appearing elderly female, alert and oriented x 3 today.   ?Head- normocephalic, atraumatic ?Eyes-  Sclera clear, conjunctiva pink ?Ears- hearing intact ?Oropharynx- clear ?Neck- supple  ?Lungs- Clear to ausculation bilaterally, normal work of breathing ?Heart- Regular rate and rhythm, no murmurs, rubs or gallops  ?GI- soft, NT, ND, + BS ?Extremities- no clubbing, cyanosis, or edema ?MS- no significant deformity or atrophy ?Skin- no rash or lesion ?Psych-  euthymic mood, full affect ?Neuro- strength and sensation are intact ? ?Wt Readings from Last 3 Encounters:  ?03/08/22 51.3 kg  ?03/05/22 52.2 kg  ?02/12/22 52.2 kg  ? ? ?EKG today demonstrates  ?SB, 1st degre

## 2022-03-10 ENCOUNTER — Encounter: Payer: Self-pay | Admitting: Cardiovascular Disease

## 2022-03-10 ENCOUNTER — Ambulatory Visit (INDEPENDENT_AMBULATORY_CARE_PROVIDER_SITE_OTHER): Payer: Medicare Other | Admitting: Cardiovascular Disease

## 2022-03-10 DIAGNOSIS — I10 Essential (primary) hypertension: Secondary | ICD-10-CM

## 2022-03-10 DIAGNOSIS — I480 Paroxysmal atrial fibrillation: Secondary | ICD-10-CM | POA: Diagnosis not present

## 2022-03-10 DIAGNOSIS — E785 Hyperlipidemia, unspecified: Secondary | ICD-10-CM

## 2022-03-10 DIAGNOSIS — I2510 Atherosclerotic heart disease of native coronary artery without angina pectoris: Secondary | ICD-10-CM

## 2022-03-10 DIAGNOSIS — R002 Palpitations: Secondary | ICD-10-CM

## 2022-03-10 NOTE — Progress Notes (Signed)
? ? ? ?03/10/2022 ?Margreta Journey   ?09-05-1931  ?[id number] ? ?Primary Physician Plotnikov, Evie Lacks, MD ?Primary Cardiologist: Lorretta Harp MD Lupe Carney, [place] ? ?HPI:  Toni Mcmillan is a 88 y.o.  thin-appearing recently widowed (husband Pilar Plate of 22 years passed away 2 months ago) Caucasian female mother of 43, grandmother 7 grandchildren who I last saw in the office 6 years ago.  She is accompanied by one of her daughters Altha Harm today.  She was seen for dizziness 6 years ago. She has a history of treated hypertension and hyperlipidemia. She has never had a heart attack or stroke. She does complain of some dyspnea on exertion. She's had some recent dizziness while standing in the kitchen with tachycardia palpitations when she feels her pulse.She denies chest pain. ? ?I did obtain an event monitor that showed only PVCs at that time.  2D echo was normal.  She presented to the emergency room with an episode of syncope on 03/04/2022.  And EMS she was documented to have A-fib with a variable ventricular sponsor from 110 to 160 bpm but converted to sinus rhythm when she arrived.  She did have a chest CT that showed no pulmonary embolism but did show severe coronary calcification.  She continues to deny chest pain.  She was seen by Malka So, PA-C in the A-fib clinic on 03/08/2022 and was placed on low-dose Eliquis oral anticoagulation.  The current discussion with her daughter Altha Harm is what her living situation will be now that her husband has passed away. ? ? ? ? ?Current Meds  ?Medication Sig  ? apixaban (ELIQUIS) 2.5 MG TABS tablet Take 1 tablet (2.5 mg total) by mouth 2 (two) times daily.  ? aspirin-acetaminophen-caffeine (EXCEDRIN MIGRAINE) 250-250-65 MG per tablet Take 1-2 tablets by mouth every 6 (six) hours as needed for headache (pain).  ? Cholecalciferol (VITAMIN D-3) 25 MCG (1000 UT) CAPS Take 1,000 Units by mouth daily.  ? donepezil (ARICEPT) 10 MG tablet Take 1 tablet (10 mg total) by  mouth at bedtime.  ? LORazepam (ATIVAN) 0.5 MG tablet Take 1 tablet (0.5 mg total) by mouth at bedtime as needed for anxiety.  ? metoprolol tartrate (LOPRESSOR) 50 MG tablet TAKE 1 AND 1/2 BY MOUTH TWO TIMES A DAY  ? Multiple Vitamins-Minerals (ONE-A-DAY WOMENS 50+) TABS Take 1 tablet by mouth daily with breakfast.  ? Omega-3 Fatty Acids (FISH OIL PO) Take 1,000 mg by mouth every morning.  ? pantoprazole (PROTONIX) 40 MG tablet Take 1 tablet (40 mg total) by mouth daily.  ? Polyvinyl Alcohol-Povidone (REFRESH OP) Place 1-2 drops into both eyes daily as needed (dry eyes).  ? ramipril (ALTACE) 2.5 MG capsule TAKE ONE CAPSULE BY MOUTH DAILY  ? senna-docusate (SENOKOT-S) 8.6-50 MG tablet Take 2 tablets by mouth 2 (two) times daily.  ? simvastatin (ZOCOR) 40 MG tablet Take 0.5 tablets (20 mg total) by mouth daily at 6 PM.  ? vitamin C (ASCORBIC ACID) 500 MG tablet Take 500 mg by mouth daily.  ? Wheat Dextrin (BENEFIBER PO) Take by mouth See admin instructions. Mix 1 teaspoonful of powder into 4-8 ounces of water and drink once a day as needed for mild constipation  ?  ? ?Allergies  ?Allergen Reactions  ? Celecoxib Other (See Comments)  ?  Reaction not recalled   ? Ciprofloxacin Swelling and Other (See Comments)  ?  Caused wrist swelling  ? Cleocin [Clindamycin] Itching  ? Clindamycin Hcl Itching  ? Erythromycin Other (  See Comments)  ?  GI upset  ? Gabapentin Other (See Comments)  ?  Made the patient feel not well the next day  ? Meperidine Itching  ? Meperidine Hcl Itching  ? Morphine Itching  ? Morphine Sulfate Itching  ? Tetracycline Other (See Comments)  ?  GI upset  ? Triprolidine-Pseudoephedrine Other (See Comments)  ?  Reaction not recalled  ? Zolpidem Other (See Comments)  ?  "Did not have a good sleep"  ? Latex Itching and Rash  ? ? ?Social History  ? ?Socioeconomic History  ? Marital status: Married  ?  Spouse name: Not on file  ? Number of children: Not on file  ? Years of education: Not on file  ? Highest  education level: Not on file  ?Occupational History  ? Not on file  ?Tobacco Use  ? Smoking status: Never  ? Smokeless tobacco: Never  ? Tobacco comments:  ?  Never smoke 03/08/22  ?Substance and Sexual Activity  ? Alcohol use: No  ?  Alcohol/week: 0.0 standard drinks  ? Drug use: No  ? Sexual activity: Not on file  ?Other Topics Concern  ? Not on file  ?Social History Narrative  ? 7 grand children, 4 great-grandchildren  ? 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 Death of step- grandson by suicide- 9-10  ? ?Social Determinants of Health  ? ?Financial Resource Strain: Not on file  ?Food Insecurity: Not on file  ?Transportation Needs: Not on file  ?Physical Activity: Not on file  ?Stress: Not on file  ?Social Connections: Not on file  ?Intimate Partner Violence: Not on file  ?  ? ?Review of Systems: ?General: negative for chills, fever, night sweats or weight changes.  ?Cardiovascular: negative for chest pain, dyspnea on exertion, edema, orthopnea, palpitations, paroxysmal nocturnal dyspnea or shortness of breath ?Dermatological: negative for rash ?Respiratory: negative for cough or wheezing ?Urologic: negative for hematuria ?Abdominal: negative for nausea, vomiting, diarrhea, bright red blood per rectum, melena, or hematemesis ?Neurologic: negative for visual changes, syncope, or dizziness ?All other systems reviewed and are otherwise negative except as noted above. ? ? ? ?Blood pressure 138/66, pulse (!) 54, height 5\' 2"  (1.575 m), weight 114 lb (51.7 kg).  ?General appearance: alert and no distress ?Neck: no adenopathy, no carotid bruit, no JVD, supple, symmetrical, trachea midline, and thyroid not enlarged, symmetric, no tenderness/mass/nodules ?Lungs: clear to auscultation bilaterally ?Heart: regular rate and rhythm, S1, S2 normal, no murmur, click, rub or gallop ?Extremities: extremities normal, atraumatic, no cyanosis or edema ?Pulses: 2+ and symmetric ?Skin: Skin color,  texture, turgor normal. No rashes or lesions ?Neurologic: Grossly normal ? ?EKG sinus bradycardia 54 without ST or T wave changes.  I personally reviewed this EKG. ? ?ASSESSMENT AND PLAN:  ? ?Essential hypertension ?History of essential hypertension a blood pressure measured today at 138/66.  She is on metoprolol and low-dose ramipril. ? ?Dyslipidemia ?History of hyperlipidemia on statin therapy with lipid profile performed 02/12/2022 revealing total cholesterol 126, LDL 52 and HDL 45. ? ?Palpitations ?History of palpitations in the past with event monitoring at that time thought to be related to PVCs. ? ?PAF (paroxysmal atrial fibrillation) ([facility]) ?Patient was seen realist recently seen in the ER after an episode of syncope and was found to be in PAF with a heart rate of 110 in the 160.  She converted to sinus rhythm in route.  She was recently seen in the A-fib clinic  by Malka So.  Her  CHA2DSVASC2 score is 6.  She was placed on low-dose Eliquis.  She is currently in sinus rhythm today. ? ?Coronary artery calcification seen on CT scan ?Ms. Demerchant was in the emergency room on 03/04/2022 after presenting to the ED with syncope.  A chest CT was negative for pulmonary embolism but did show "severe coronary calcification".  She is completely asymptomatic.  This time, given her age, relative inactivity and lack of symptoms I do not feel compelled to further investigate this. ? ? ? ? ?Lorretta Harp MD FACP,FACC,FAHA, FSCAI ?03/10/2022 ?10:19 AM ?

## 2022-03-10 NOTE — Assessment & Plan Note (Signed)
History of essential hypertension a blood pressure measured today at 138/66.  She is on metoprolol and low-dose ramipril. ?

## 2022-03-10 NOTE — Assessment & Plan Note (Signed)
Toni Mcmillan was in the emergency room on 03/04/2022 after presenting to the ED with syncope.  A chest CT was negative for pulmonary embolism but did show "severe coronary calcification".  She is completely asymptomatic.  This time, given her age, relative inactivity and lack of symptoms I do not feel compelled to further investigate this. ?

## 2022-03-10 NOTE — Assessment & Plan Note (Signed)
Patient was seen realist recently seen in the ER after an episode of syncope and was found to be in PAF with a heart rate of 110 in the 160.  She converted to sinus rhythm in route.  She was recently seen in the A-fib clinic by [facility].  Her  CHA2DSVASC2 score is 6.  She was placed on low-dose Eliquis.  She is currently in sinus rhythm today. ?

## 2022-03-10 NOTE — Assessment & Plan Note (Signed)
History of hyperlipidemia on statin therapy with lipid profile performed 02/12/2022 revealing total cholesterol 126, LDL 52 and HDL 45. ?

## 2022-03-10 NOTE — Patient Instructions (Signed)
Medication Instructions:  ?Your physician recommends that you continue on your current medications as directed. Please refer to the Current Medication list given to you today. ? ?*If you need a refill on your cardiac medications before your next appointment, please call your pharmacy* ? ? ?Follow-Up: ?At [facility], you and your health needs are our priority.  As part of our continuing mission to provide you with exceptional heart care, we have created designated Provider Care Teams.  These Care Teams include your primary Cardiologist (physician) and Advanced Practice Providers (APPs -  Physician Assistants and Nurse Practitioners) who all work together to provide you with the care you need, when you need it. ? ?We recommend signing up for the patient portal called "MyChart".  Sign up information is provided on this After Visit Summary.  MyChart is used to connect with patients for Virtual Visits (Telemedicine).  Patients are able to view lab/test results, encounter notes, upcoming appointments, etc.  Non-urgent messages can be sent to your provider as well.   ?To learn more about what you can do with MyChart, go to [email or web address].   ? ?Your next appointment:   ?6 month(s) ? ?The format for your next appointment:   ?In Person ? ?Provider:   ?Jesse Cleaver, FNP, Angela Duke, PA-C, Callie Goodrich, PA-C, Jennifer, Lambert, PA-C, Kathryn Lawrence, DNP, ANP, Hao Meng, PA-C, or Emily Monge, NP     ? ? ?Then, Jonathan Berry, MD will plan to see you again in 12 month(s). ?

## 2022-03-10 NOTE — Assessment & Plan Note (Signed)
History of palpitations in the past with event monitoring at that time thought to be related to PVCs. ?

## 2022-03-12 NOTE — Addendum Note (Signed)
Addended by: Venetia Maxon on: 03/12/2022 07:32 AM ? ? Modules accepted: Orders ? ?

## 2022-03-18 DIAGNOSIS — Z961 Presence of intraocular lens: Secondary | ICD-10-CM | POA: Diagnosis not present

## 2022-03-18 DIAGNOSIS — H43823 Vitreomacular adhesion, bilateral: Secondary | ICD-10-CM | POA: Diagnosis not present

## 2022-03-18 DIAGNOSIS — E113213 Type 2 diabetes mellitus with mild nonproliferative diabetic retinopathy with macular edema, bilateral: Secondary | ICD-10-CM | POA: Diagnosis not present

## 2022-03-24 ENCOUNTER — Encounter: Payer: Self-pay | Admitting: Cardiovascular Disease

## 2022-03-25 ENCOUNTER — Ambulatory Visit: Payer: Medicare Other | Admitting: Internal Medicine

## 2022-03-25 ENCOUNTER — Encounter: Payer: Self-pay | Admitting: Internal Medicine

## 2022-03-25 DIAGNOSIS — H6120 Impacted cerumen, unspecified ear: Secondary | ICD-10-CM | POA: Insufficient documentation

## 2022-03-25 DIAGNOSIS — H6123 Impacted cerumen, bilateral: Secondary | ICD-10-CM

## 2022-03-25 DIAGNOSIS — G8929 Other chronic pain: Secondary | ICD-10-CM

## 2022-03-25 DIAGNOSIS — M25571 Pain in right ankle and joints of right foot: Secondary | ICD-10-CM | POA: Diagnosis not present

## 2022-03-25 DIAGNOSIS — R296 Repeated falls: Secondary | ICD-10-CM | POA: Diagnosis not present

## 2022-03-25 DIAGNOSIS — R413 Other amnesia: Secondary | ICD-10-CM

## 2022-03-25 NOTE — Patient Instructions (Addendum)
Blue-Emu cream was --use 2-3 times a day ? ?Use ear drops x 1 week ?Come back in 1 wk for ear irrigation ? ?Security cameras 2 way ?Apple air tag ?Electronic locks on doors ? ? ? ?

## 2022-03-25 NOTE — Assessment & Plan Note (Signed)
Use ear drops ?RTC 1 wk for ear irrigation ?

## 2022-03-25 NOTE — Assessment & Plan Note (Signed)
Chroonic - OA ?Blue-Emu cream was recommended to use 2-3 times a day ?

## 2022-03-25 NOTE — Assessment & Plan Note (Signed)
Multifactorial 

## 2022-03-25 NOTE — Assessment & Plan Note (Addendum)
Doing better ?Will try a low dose Lorazepam to help insomnia ? Potential benefits of a long term benzodiazepines  use as well as potential risks  and complications were explained to the patient and were aknowledged. ?We discussed the possibility of using the following devices for home monitoring: security two way cameras, Apple air tags. ?Electronic locks on doors ? ?

## 2022-03-25 NOTE — Progress Notes (Signed)
? ?Subjective:  ?Patient ID: Toni Mcmillan, female    DOB: 06/21/1931  Age: 88 y.o. MRN: [id number] ? ?CC: No chief complaint on file. ? ? ?HPI ?Toni MoreGertrude L Pietrzyk presents for post-ER visits - A fib, falls. ?C/o hearing loss ?Here w/dtr Para MarchJeanette who helps w/history. ?Grieving - husband Homero FellersFrank died in February 2023 ? ?Outpatient Medications Prior to Visit  ?Medication Sig Dispense Refill  ? apixaban (ELIQUIS) 2.5 MG TABS tablet Take 1 tablet (2.5 mg total) by mouth 2 (two) times daily. 60 tablet 3  ? aspirin-acetaminophen-caffeine (EXCEDRIN MIGRAINE) 250-250-65 MG per tablet Take 1-2 tablets by mouth every 6 (six) hours as needed for headache (pain).    ? Cholecalciferol (VITAMIN D-3) 25 MCG (1000 UT) CAPS Take 1,000 Units by mouth daily.    ? donepezil (ARICEPT) 10 MG tablet Take 1 tablet (10 mg total) by mouth at bedtime. 90 tablet 3  ? LORazepam (ATIVAN) 0.5 MG tablet Take 1 tablet (0.5 mg total) by mouth at bedtime as needed for anxiety. 30 tablet 5  ? metoprolol tartrate (LOPRESSOR) 50 MG tablet TAKE 1 AND 1/2 BY MOUTH TWO TIMES A DAY 270 tablet 1  ? Multiple Vitamins-Minerals (ONE-A-DAY WOMENS 50+) TABS Take 1 tablet by mouth daily with breakfast.    ? Omega-3 Fatty Acids (FISH OIL PO) Take 1,000 mg by mouth every morning.    ? pantoprazole (PROTONIX) 40 MG tablet Take 1 tablet (40 mg total) by mouth daily. 90 tablet 3  ? Polyvinyl Alcohol-Povidone (REFRESH OP) Place 1-2 drops into both eyes daily as needed (dry eyes).    ? ramipril (ALTACE) 2.5 MG capsule TAKE ONE CAPSULE BY MOUTH DAILY 90 capsule 3  ? senna-docusate (SENOKOT-S) 8.6-50 MG tablet Take 2 tablets by mouth 2 (two) times daily. 60 tablet 5  ? simvastatin (ZOCOR) 40 MG tablet Take 0.5 tablets (20 mg total) by mouth daily at 6 PM. 30 tablet 11  ? vitamin C (ASCORBIC ACID) 500 MG tablet Take 500 mg by mouth daily.    ? Wheat Dextrin (BENEFIBER PO) Take by mouth See admin instructions. Mix 1 teaspoonful of powder into 4-8 ounces of water and drink  once a day as needed for mild constipation    ? ?No facility-administered medications prior to visit.  ? ? ?ROS: ?Review of Systems  ?Constitutional:  Negative for activity change, appetite change, chills, fatigue and unexpected weight change.  ?HENT:  Positive for hearing loss. Negative for congestion, mouth sores and sinus pressure.   ?Eyes:  Negative for visual disturbance.  ?Respiratory:  Negative for cough and chest tightness.   ?Gastrointestinal:  Negative for abdominal pain and nausea.  ?Genitourinary:  Negative for difficulty urinating, frequency and vaginal pain.  ?Musculoskeletal:  Negative for back pain and gait problem.  ?Skin:  Negative for pallor and rash.  ?Neurological:  Negative for dizziness, tremors, weakness, numbness and headaches.  ?Psychiatric/Behavioral:  Positive for decreased concentration and sleep disturbance. Negative for confusion.   ? ?Objective:  ?BP 130/80 (BP Location: Left Arm, Patient Position: Sitting, Cuff Size: Normal)   Pulse 61   Temp 97.8 ?F (36.6 ?C) (Oral)   Ht 5\' 2"  (1.575 m)   Wt 113 lb (51.3 kg)   SpO2 98%   BMI 20.67 kg/m?  ? ?BP Readings from Last 3 Encounters:  ?03/29/22 114/68  ?03/25/22 130/80  ?03/10/22 138/66  ? ? ?Wt Readings from Last 3 Encounters:  ?03/29/22 112 lb 9.6 oz (51.1 kg)  ?03/25/22 113 lb (51.3 kg)  ?  03/10/22 114 lb (51.7 kg)  ? ? ?Physical Exam ?Constitutional:   ?   General: She is not in acute distress. ?   Appearance: Normal appearance. She is well-developed.  ?HENT:  ?   Head: Normocephalic.  ?   Right Ear: External ear normal. There is impacted cerumen.  ?   Left Ear: External ear normal. There is impacted cerumen.  ?   Nose: Nose normal.  ?Eyes:  ?   General:     ?   Right eye: No discharge.     ?   Left eye: No discharge.  ?   Conjunctiva/sclera: Conjunctivae normal.  ?   Pupils: Pupils are equal, round, and reactive to light.  ?Neck:  ?   Thyroid: No thyromegaly.  ?   Vascular: No JVD.  ?   Trachea: No tracheal deviation.   ?Cardiovascular:  ?   Rate and Rhythm: Normal rate and regular rhythm.  ?   Heart sounds: Normal heart sounds.  ?Pulmonary:  ?   Effort: No respiratory distress.  ?   Breath sounds: No stridor. No wheezing.  ?Abdominal:  ?   General: Bowel sounds are normal. There is no distension.  ?   Palpations: Abdomen is soft. There is no mass.  ?   Tenderness: There is no abdominal tenderness. There is no guarding or rebound.  ?Musculoskeletal:     ?   General: No tenderness.  ?   Cervical back: Normal range of motion and neck supple. No rigidity.  ?Lymphadenopathy:  ?   Cervical: No cervical adenopathy.  ?Skin: ?   Findings: No erythema or rash.  ?Neurological:  ?   Cranial Nerves: No cranial nerve deficit.  ?   Motor: No abnormal muscle tone.  ?   Coordination: Coordination normal.  ?   Gait: Gait abnormal.  ?   Deep Tendon Reflexes: Reflexes normal.  ?Psychiatric:     ?   Behavior: Behavior normal.     ?   Thought Content: Thought content normal.     ?   Judgment: Judgment normal.  ?Slightly ataxic gait ?Wax B ? ? ? A total time of 45 minutes was spent preparing to see the patient, reviewing tests, x-rays, operative reports and other medical records.  Also, obtaining history and performing comprehensive physical exam.  Additionally, counseling the patient and dtr regarding the above listed issues - memory loss, OA, A fib.   Finally, documenting clinical information in the health records, coordination of care, educating the patient re fall prevention. It is a complex case. ? ?Lab Results  ?Component Value Date  ? WBC 10.8 (H) 03/05/2022  ? HGB 11.5 (L) 03/05/2022  ? HCT 35.7 (L) 03/05/2022  ? PLT 276 03/05/2022  ? GLUCOSE 103 (H) 03/05/2022  ? CHOL 126 02/12/2022  ? TRIG 147.0 02/12/2022  ? HDL 45.10 02/12/2022  ? LDLDIRECT 84.0 12/11/2019  ? LDLCALC 52 02/12/2022  ? ALT 12 03/05/2022  ? AST 20 03/05/2022  ? NA 136 03/05/2022  ? K 3.8 03/05/2022  ? CL 103 03/05/2022  ? CREATININE 0.81 03/05/2022  ? BUN 13 03/05/2022  ? CO2  24 03/05/2022  ? TSH 1.503 03/04/2022  ? HGBA1C 6.6 (H) 02/12/2022  ? MICROALBUR 1.4 02/12/2022  ? ? ?No results found. ? ?Assessment & Plan:  ? ?Problem List Items Addressed This Visit   ? ? Falls frequently  ?  Multifactorial ? ?  ?  ? Ankle pain, right  ?  Chroonic -  OA ?Blue-Emu cream was recommended to use 2-3 times a day ? ?  ?  ? Cerumen impaction  ?  Use ear drops ?RTC 1 wk for ear irrigation ? ?  ?  ? Memory change  ?  Doing better ?Will try a low dose Lorazepam to help insomnia ? Potential benefits of a long term benzodiazepines  use as well as potential risks  and complications were explained to the patient and were aknowledged. ?We discussed the possibility of using the following devices for home monitoring: security two way cameras, Apple air tags. ?Electronic locks on doors ? ?  ?  ?  ? ? ?No orders of the defined types were placed in this encounter. ?  ? ? ?Follow-up: Return in about 2 months (around 05/25/2022) for a follow-up visit. ? ?Sonda Primes, MD ?

## 2022-03-29 ENCOUNTER — Ambulatory Visit: Payer: Medicare Other | Admitting: Internal Medicine

## 2022-03-29 ENCOUNTER — Encounter: Payer: Self-pay | Admitting: Internal Medicine

## 2022-03-29 ENCOUNTER — Ambulatory Visit (INDEPENDENT_AMBULATORY_CARE_PROVIDER_SITE_OTHER): Payer: Medicare Other

## 2022-03-29 VITALS — BP 114/68 | HR 43 | Temp 97.6°F | Ht 62.0 in | Wt 112.6 lb

## 2022-03-29 DIAGNOSIS — H6123 Impacted cerumen, bilateral: Secondary | ICD-10-CM

## 2022-03-29 DIAGNOSIS — E1140 Type 2 diabetes mellitus with diabetic neuropathy, unspecified: Secondary | ICD-10-CM

## 2022-03-29 DIAGNOSIS — G8929 Other chronic pain: Secondary | ICD-10-CM

## 2022-03-29 DIAGNOSIS — M546 Pain in thoracic spine: Secondary | ICD-10-CM

## 2022-03-29 DIAGNOSIS — I10 Essential (primary) hypertension: Secondary | ICD-10-CM | POA: Diagnosis not present

## 2022-03-29 NOTE — Assessment & Plan Note (Signed)
BP Readings from Last 3 Encounters:  ?03/29/22 114/68  ?03/25/22 130/80  ?03/10/22 138/66  ? ?Stable, pt to continue medical treatment lopressor, altace ? ?

## 2022-03-29 NOTE — Assessment & Plan Note (Signed)
Lab Results  ?Component Value Date  ? HGBA1C 6.6 (H) 02/12/2022  ? ?Stable, pt to continue current medical treatment  - diet ? ?

## 2022-03-29 NOTE — Patient Instructions (Signed)
Your ears were cleared today of wax ? ?Your vital signs were repeated today to check the low pulse again ? ?Please continue all other medications as before, and refills have been done if requested. ? ?Please have the pharmacy call with any other refills you may need. ? ?Please continue your efforts at being more active, low cholesterol diet, and weight control. ? ?Please keep your appointments with your specialists as you may have planned ? ?Please go to the XRAY Department in the first floor for the x-ray testing - only if you want to check the thoracic spine xrays ? ?You will be contacted by phone if any changes need to be made immediately.  Otherwise, you will receive a letter about your results with an explanation, but please check with MyChart first. ? ?Please remember to sign up for MyChart if you have not done so, as this will be important to you in the future with finding out test results, communicating by private email, and scheduling acute appointments online when needed. ? ? ?

## 2022-03-29 NOTE — Assessment & Plan Note (Signed)
C/w msk strain, pt reassured, but asks also for T spine films - will defer to pt ?

## 2022-03-29 NOTE — Assessment & Plan Note (Signed)
Recurrent, resolved today with irrigataion, hearing improved ? ?Ceruminosis is noted.  Wax is removed by syringing and manual debridement. Instructions for home care to prevent wax buildup are given. ? ?

## 2022-03-29 NOTE — Progress Notes (Signed)
PRE-PROCEDURE EXAM: Left and right  tympanic membrane cannot be visualized due to total occlusion/impaction of the ear canal. ? ?PROCEDURE INDICATION: remove wax to visualize ear drum & relieve discomfort ? ?CONSENT:  Verbal ? ?  ? ?PROCEDURE NOTE: ? ?Bilateral EAR:  I used a metal wax curette under direct vision with an otoscope to free the wax bolus from the ear wall and then successfully removed large bits of wax from both ears. Both ears was then irrigated with warm water to remove the remaining wax. ? ?POST- PROCEDURE EXAM: Tympanic membrane successfully visualized and found to have no erythema ? ?  ? ?The patient tolerated the procedure well.   ?

## 2022-03-29 NOTE — Progress Notes (Signed)
Patient ID: Toni MoreGertrude L Brinton, female   DOB: 02/20/1931, 88 y.o.   MRN: [id number] ? ? ? ?    Chief Complaint: follow up reduced hearing bilateral ears, left mid back pain, bradycardia ? ?     HPI:  Toni Mcmillan is a 88 y.o. female here with daughter; has had 2 wks worsening hearing loss, made worse with daguhter tyring to use peroxide to soften the wax, without ear pain, HA, fever, chills, ST, cough, vertigo or tinnitus.  Pt denies chest pain, increased sob or doe, wheezing, orthopnea, PND, increased LE swelling, palpitations, dizziness or syncope.  Did have initial HR 43 documented today on BB but repeat in mid 750s.   Pt denies polydipsia, polyuria, or new focal neuro s/s.    Pt denies fever, wt loss, night sweats, loss of appetite, or other constitutional symptoms  Daughter very supportive  Has chronic left sided parathoroacic back pain and again c/o this today, tender to touch at times, worse to use the left arm, and had neg CTA chest apr 2023 for acute.   ?      ?Wt Readings from Last 3 Encounters:  ?03/29/22 112 lb 9.6 oz (51.1 kg)  ?03/25/22 113 lb (51.3 kg)  ?03/10/22 114 lb (51.7 kg)  ? ?BP Readings from Last 3 Encounters:  ?03/29/22 114/68  ?03/25/22 130/80  ?03/10/22 138/66  ? ?      ?Past Medical History:  ?Diagnosis Date  ? Allergic rhinitis, cause unspecified   ? Diabetes mellitus without complication (HCC)   ? borderline"never on meds"  ? Disturbances of sensation of smell and taste   ? Dizziness   ? Headache(784.0)   ? has decreased to none in later years  ? Insomnia, unspecified   ? Other and unspecified hyperlipidemia   ? Personal history of other diseases of digestive system   ? Scarlet fever   ? Unspecified essential hypertension   ? Metoprolol decreased due to drop in BP readings.  ? Unspecified sinusitis (chronic)   ? Whooping cough, unspecified organism   ? ?Past Surgical History:  ?Procedure Laterality Date  ? ABDOMINAL HYSTERECTOMY    ? ANAL RECTAL MANOMETRY N/A 07/07/2015  ? Procedure: ANO  RECTAL MANOMETRY;  Surgeon: Charolett BumpersMartin K Johnson, MD;  Location: [facility];  Service: Endoscopy;  Laterality: N/A;  ? BILATERAL OOPHORECTOMY    ? CATARACT EXTRACTION, BILATERAL    ? COLONOSCOPY W/ POLYPECTOMY    ? COLONOSCOPY WITH PROPOFOL N/A 05/29/2015  ? Procedure: COLONOSCOPY WITH PROPOFOL;  Surgeon: Charolett BumpersMartin K Johnson, MD;  Location: [facility];  Service: Endoscopy;  Laterality: N/A;  ? correction of hammertoe deformity    ? CYSTOSCOPY    ? w/ stenting of both ureters  ? HEMORRHOID SURGERY    ? hydrosalpinx diagnostic laparoscopy    ? TONSILLECTOMY  age [id number]  ? vericose vein surgery    ? ? reports that she has never smoked. She has never used smokeless tobacco. She reports that she does not drink alcohol and does not use drugs. ?family history includes Cancer in her mother and paternal grandmother; Diabetes in her mother; Hyperlipidemia in her father; Hypertension in her father; Other in her father and mother. ?Allergies  ?Allergen Reactions  ? Celecoxib Other (See Comments)  ?  Reaction not recalled   ? Ciprofloxacin Swelling and Other (See Comments)  ?  Caused wrist swelling  ? Cleocin [Clindamycin] Itching  ? Clindamycin Hcl Itching  ? Erythromycin Other (See Comments)  ?  GI  upset  ? Gabapentin Other (See Comments)  ?  Made the patient feel not well the next day  ? Meperidine Itching  ? Meperidine Hcl Itching  ? Morphine Itching  ? Morphine Sulfate Itching  ? Tetracycline Other (See Comments)  ?  GI upset  ? Triprolidine-Pseudoephedrine Other (See Comments)  ?  Reaction not recalled  ? Zolpidem Other (See Comments)  ?  "Did not have a good sleep"  ? Latex Itching and Rash  ? ?Current Outpatient Medications on File Prior to Visit  ?Medication Sig Dispense Refill  ? apixaban (ELIQUIS) 2.5 MG TABS tablet Take 1 tablet (2.5 mg total) by mouth 2 (two) times daily. 60 tablet 3  ? aspirin-acetaminophen-caffeine (EXCEDRIN MIGRAINE) 250-250-65 MG per tablet Take 1-2 tablets by mouth every 6 (six) hours as needed for  headache (pain).    ? Cholecalciferol (VITAMIN D-3) 25 MCG (1000 UT) CAPS Take 1,000 Units by mouth daily.    ? donepezil (ARICEPT) 10 MG tablet Take 1 tablet (10 mg total) by mouth at bedtime. 90 tablet 3  ? LORazepam (ATIVAN) 0.5 MG tablet Take 1 tablet (0.5 mg total) by mouth at bedtime as needed for anxiety. 30 tablet 5  ? metoprolol tartrate (LOPRESSOR) 50 MG tablet TAKE 1 AND 1/2 BY MOUTH TWO TIMES A DAY 270 tablet 1  ? Multiple Vitamins-Minerals (ONE-A-DAY WOMENS 50+) TABS Take 1 tablet by mouth daily with breakfast.    ? Omega-3 Fatty Acids (FISH OIL PO) Take 1,000 mg by mouth every morning.    ? pantoprazole (PROTONIX) 40 MG tablet Take 1 tablet (40 mg total) by mouth daily. 90 tablet 3  ? Polyvinyl Alcohol-Povidone (REFRESH OP) Place 1-2 drops into both eyes daily as needed (dry eyes).    ? ramipril (ALTACE) 2.5 MG capsule TAKE ONE CAPSULE BY MOUTH DAILY 90 capsule 3  ? senna-docusate (SENOKOT-S) 8.6-50 MG tablet Take 2 tablets by mouth 2 (two) times daily. 60 tablet 5  ? simvastatin (ZOCOR) 40 MG tablet Take 0.5 tablets (20 mg total) by mouth daily at 6 PM. 30 tablet 11  ? vitamin C (ASCORBIC ACID) 500 MG tablet Take 500 mg by mouth daily.    ? Wheat Dextrin (BENEFIBER PO) Take by mouth See admin instructions. Mix 1 teaspoonful of powder into 4-8 ounces of water and drink once a day as needed for mild constipation    ? ?No current facility-administered medications on file prior to visit.  ? ?     ROS:  All others reviewed and negative. ? ?Objective  ? ?     PE:  BP 114/68 (BP Location: Left Arm, Patient Position: Sitting, Cuff Size: Normal)   Pulse (!) 43   Temp 97.6 ?F (36.4 ?C) (Oral)   Ht 5\' 2"  (1.575 m)   Wt 112 lb 9.6 oz (51.1 kg)   SpO2 98%   BMI 20.59 kg/m?  ? ?              Constitutional: Pt appears in NAD ?              HENT: Head: NCAT.  ?              Right Ear: External ear normal.   ?              Left Ear: External ear normal.  Bilateral wax impaction resolved with irrigation hearing  improved ?              Eyes: .  Pupils are equal, round, and reactive to light. Conjunctivae and EOM are normal ?              Nose: without d/c or deformity ?              Neck: Neck supple. Gross normal ROM ?              Cardiovascular: Normal rate and regular rhythm.   ?              Pulmonary/Chest: Effort normal and breath sounds without rales or wheezing.  ?              Abd:  Soft, NT, ND, + BS, no organomegaly ?              Neurological: Pt is alert. At baseline orientation, motor grossly intact ?              Skin: Skin is warm. No rashes, no other new lesions, LE edema - none ?              Psychiatric: Pt behavior is normal without agitation  ? ?Micro: none ? ?Cardiac tracings I have personally interpreted today:  none ? ?Pertinent Radiological findings (summarize): none  ? ?Lab Results  ?Component Value Date  ? WBC 10.8 (H) 03/05/2022  ? HGB 11.5 (L) 03/05/2022  ? HCT 35.7 (L) 03/05/2022  ? PLT 276 03/05/2022  ? GLUCOSE 103 (H) 03/05/2022  ? CHOL 126 02/12/2022  ? TRIG 147.0 02/12/2022  ? HDL 45.10 02/12/2022  ? LDLDIRECT 84.0 12/11/2019  ? LDLCALC 52 02/12/2022  ? ALT 12 03/05/2022  ? AST 20 03/05/2022  ? NA 136 03/05/2022  ? K 3.8 03/05/2022  ? CL 103 03/05/2022  ? CREATININE 0.81 03/05/2022  ? BUN 13 03/05/2022  ? CO2 24 03/05/2022  ? TSH 1.503 03/04/2022  ? HGBA1C 6.6 (H) 02/12/2022  ? MICROALBUR 1.4 02/12/2022  ? ?Assessment/Plan:  ?NEILANI DUFFEE is a 88 y.o. White or Caucasian [1] female with  has a past medical history of Allergic rhinitis, cause unspecified, Diabetes mellitus without complication (HCC), Disturbances of sensation of smell and taste, Dizziness, Headache(784.0), Insomnia, unspecified, Other and unspecified hyperlipidemia, Personal history of other diseases of digestive system, Scarlet fever, Unspecified essential hypertension, Unspecified sinusitis (chronic), and Whooping cough, unspecified organism. ? ?Bilateral impacted cerumen ?Recurrent, resolved today with irrigataion,  hearing improved ? ?Ceruminosis is noted.  Wax is removed by syringing and manual debridement. Instructions for home care to prevent wax buildup are given. ? ? ?Chronic left-sided thoracic back pain ?C/w [facility]

## 2022-04-01 ENCOUNTER — Ambulatory Visit: Payer: Medicare Other | Admitting: Internal Medicine

## 2022-04-02 ENCOUNTER — Other Ambulatory Visit: Payer: Self-pay | Admitting: Internal Medicine

## 2022-04-06 NOTE — Telephone Encounter (Signed)
Spoke with pt's daughter Toni Mcmillan (ok per DPR) regarding [employer] pt assistance, per representative with [employer] pt only needs to spend $175.40 to qualify for pt assistance. [name] states that they will fill her prescription in the next few days and that will fulfill that balance. Daughter will have out of pocket expenses printed from the pharmacy at that time and send via mychart for re-submission of paperwork. Daughter verbalizes understanding.

## 2022-04-14 ENCOUNTER — Telehealth: Payer: Self-pay | Admitting: Internal Medicine

## 2022-04-14 NOTE — Telephone Encounter (Signed)
Called pt to schedule AWV stated she will call back to schedule.

## 2022-04-27 DIAGNOSIS — Z01419 Encounter for gynecological examination (general) (routine) without abnormal findings: Secondary | ICD-10-CM | POA: Diagnosis not present

## 2022-04-27 DIAGNOSIS — Z6820 Body mass index (BMI) 20.0-20.9, adult: Secondary | ICD-10-CM | POA: Diagnosis not present

## 2022-04-27 DIAGNOSIS — Z1231 Encounter for screening mammogram for malignant neoplasm of breast: Secondary | ICD-10-CM | POA: Diagnosis not present

## 2022-04-27 DIAGNOSIS — M816 Localized osteoporosis [Lequesne]: Secondary | ICD-10-CM | POA: Diagnosis not present

## 2022-04-28 ENCOUNTER — Telehealth: Payer: Self-pay

## 2022-04-28 NOTE — Telephone Encounter (Signed)
Called daughter to make them aware that pt has been approved for [employer] pt assistance for eliquis until 11/14/22.

## 2022-05-25 ENCOUNTER — Ambulatory Visit: Payer: Medicare Other | Admitting: Internal Medicine

## 2022-05-25 ENCOUNTER — Encounter: Payer: Self-pay | Admitting: Internal Medicine

## 2022-05-25 DIAGNOSIS — I480 Paroxysmal atrial fibrillation: Secondary | ICD-10-CM

## 2022-05-25 DIAGNOSIS — I10 Essential (primary) hypertension: Secondary | ICD-10-CM | POA: Diagnosis not present

## 2022-05-25 DIAGNOSIS — G4720 Circadian rhythm sleep disorder, unspecified type: Secondary | ICD-10-CM | POA: Insufficient documentation

## 2022-05-25 DIAGNOSIS — M810 Age-related osteoporosis without current pathological fracture: Secondary | ICD-10-CM | POA: Insufficient documentation

## 2022-05-25 DIAGNOSIS — R413 Other amnesia: Secondary | ICD-10-CM

## 2022-05-25 DIAGNOSIS — E1140 Type 2 diabetes mellitus with diabetic neuropathy, unspecified: Secondary | ICD-10-CM

## 2022-05-25 DIAGNOSIS — E785 Hyperlipidemia, unspecified: Secondary | ICD-10-CM | POA: Insufficient documentation

## 2022-05-25 NOTE — Assessment & Plan Note (Signed)
Stable Fall prevention

## 2022-05-25 NOTE — Assessment & Plan Note (Signed)
Pt lost wt 

## 2022-05-25 NOTE — Progress Notes (Signed)
0.29 I had learned that her weight around grew up on grams in the kilograms intolerance 7/continue I think like 100 years ago pounds did mean a pound there was [place] pound [place] pounds Sunday all measured different things January to help with doing what kidney see out of it feels dry and has revision however that See clear okay  Subjective:  Patient ID: Toni Mcmillan, female    DOB: 07-16-1931  Age: 88 y.o. MRN: [id number]  CC: No chief complaint on file.   HPI Toni Mcmillan presents for memory loss, grief, HTN  Outpatient Medications Prior to Visit  Medication Sig Dispense Refill   apixaban (ELIQUIS) 2.5 MG TABS tablet Take 1 tablet (2.5 mg total) by mouth 2 (two) times daily. 60 tablet 3   aspirin-acetaminophen-caffeine (EXCEDRIN MIGRAINE) 250-250-65 MG per tablet Take 1-2 tablets by mouth every 6 (six) hours as needed for headache (pain).     Cholecalciferol (VITAMIN D-3) 25 MCG (1000 UT) CAPS Take 1,000 Units by mouth daily.     donepezil (ARICEPT) 10 MG tablet Take 1 tablet (10 mg total) by mouth at bedtime. 90 tablet 3   LORazepam (ATIVAN) 0.5 MG tablet Take 1 tablet (0.5 mg total) by mouth at bedtime as needed for anxiety. 30 tablet 5   metoprolol tartrate (LOPRESSOR) 50 MG tablet TAKE 1 AND 1/2 BY MOUTH TWO TIMES A DAY 270 tablet 1   Multiple Vitamins-Minerals (ONE-A-DAY WOMENS 50+) TABS Take 1 tablet by mouth daily with breakfast.     Omega-3 Fatty Acids (FISH OIL PO) Take 1,000 mg by mouth every morning.     pantoprazole (PROTONIX) 40 MG tablet Take 1 tablet (40 mg total) by mouth daily. 90 tablet 3   Polyvinyl Alcohol-Povidone (REFRESH OP) Place 1-2 drops into both eyes daily as needed (dry eyes).     ramipril (ALTACE) 2.5 MG capsule TAKE ONE CAPSULE BY MOUTH DAILY 90 capsule 3   senna-docusate (SENOKOT-S) 8.6-50 MG tablet Take 2 tablets by mouth 2 (two) times daily. 60 tablet 5   simvastatin (ZOCOR) 40 MG tablet Take 1 tablet (40 mg total) by mouth daily at 6 PM. 90  tablet 1   vitamin C (ASCORBIC ACID) 500 MG tablet Take 500 mg by mouth daily.     Wheat Dextrin (BENEFIBER PO) Take by mouth See admin instructions. Mix 1 teaspoonful of powder into 4-8 ounces of water and drink once a day as needed for mild constipation     No facility-administered medications prior to visit.    ROS: Review of Systems  Constitutional:  Negative for activity change, appetite change, chills, fatigue and unexpected weight change.  HENT:  Negative for congestion, mouth sores and sinus pressure.   Eyes:  Negative for visual disturbance.  Respiratory:  Negative for cough and chest tightness.   Gastrointestinal:  Negative for abdominal pain and nausea.  Genitourinary:  Negative for difficulty urinating, frequency and vaginal pain.  Musculoskeletal:  Negative for back pain and gait problem.  Skin:  Negative for pallor and rash.  Neurological:  Negative for dizziness, tremors, weakness, numbness and headaches.  Psychiatric/Behavioral:  Negative for confusion and sleep disturbance.     Objective:  BP 122/78 (BP Location: Right Arm, Patient Position: Sitting, Cuff Size: Normal)   Pulse (!) 50   Temp 97.8 F (36.6 C) (Oral)   Ht 5\' 2"  (1.575 m)   Wt 106 lb (48.1 kg)   SpO2 96%   BMI 19.39 kg/m   BP Readings from Last  3 Encounters:  05/25/22 122/78  03/29/22 114/68  03/25/22 130/80    Wt Readings from Last 3 Encounters:  05/25/22 106 lb (48.1 kg)  03/29/22 112 lb 9.6 oz (51.1 kg)  03/25/22 113 lb (51.3 kg)    Physical Exam Constitutional:      General: She is not in acute distress.    Appearance: Normal appearance. She is well-developed.  HENT:     Head: Normocephalic.     Right Ear: External ear normal.     Left Ear: External ear normal.     Nose: Nose normal.  Eyes:     General:        Right eye: No discharge.        Left eye: No discharge.     Conjunctiva/sclera: Conjunctivae normal.     Pupils: Pupils are equal, round, and reactive to light.  Neck:      Thyroid: No thyromegaly.     Vascular: No JVD.     Trachea: No tracheal deviation.  Cardiovascular:     Rate and Rhythm: Normal rate and regular rhythm.     Heart sounds: Normal heart sounds.  Pulmonary:     Effort: No respiratory distress.     Breath sounds: No stridor. No wheezing.  Abdominal:     General: Bowel sounds are normal. There is no distension.     Palpations: Abdomen is soft. There is no mass.     Tenderness: There is no abdominal tenderness. There is no guarding or rebound.  Musculoskeletal:        General: No tenderness.     Cervical back: Normal range of motion and neck supple. No rigidity.  Lymphadenopathy:     Cervical: No cervical adenopathy.  Skin:    Findings: No erythema or rash.  Neurological:     Cranial Nerves: No cranial nerve deficit.     Motor: No abnormal muscle tone.     Coordination: Coordination normal.     Deep Tendon Reflexes: Reflexes normal.  Psychiatric:        Behavior: Behavior normal.        Thought Content: Thought content normal.        Judgment: Judgment normal.   Using a cane  Lab Results  Component Value Date   WBC 10.8 (H) 03/05/2022   HGB 11.5 (L) 03/05/2022   HCT 35.7 (L) 03/05/2022   PLT 276 03/05/2022   GLUCOSE 103 (H) 03/05/2022   CHOL 126 02/12/2022   TRIG 147.0 02/12/2022   HDL 45.10 02/12/2022   LDLDIRECT 84.0 12/11/2019   LDLCALC 52 02/12/2022   ALT 12 03/05/2022   AST 20 03/05/2022   NA 136 03/05/2022   K 3.8 03/05/2022   CL 103 03/05/2022   CREATININE 0.81 03/05/2022   BUN 13 03/05/2022   CO2 24 03/05/2022   TSH 1.503 03/04/2022   HGBA1C 6.6 (H) 02/12/2022   MICROALBUR 1.4 02/12/2022    No results found.  Assessment & Plan:   Problem List Items Addressed This Visit     Essential hypertension (Chronic)    BP Readings from Last 3 Encounters:  05/25/22 122/78  03/29/22 114/68  03/25/22 130/80        Memory change    Stable Fall prevention      PAF (paroxysmal atrial fibrillation)  (HCC)    No relapse      Type 2 diabetes, controlled, with neuropathy (HCC) (Chronic)    Pt lost wt  No orders of the defined types were placed in this encounter.     Follow-up: Return in about 3 months (around 08/25/2022) for a follow-up visit.  Walker Kehr, MD

## 2022-05-25 NOTE — Assessment & Plan Note (Signed)
No relapse 

## 2022-05-25 NOTE — Assessment & Plan Note (Signed)
BP Readings from Last 3 Encounters:  05/25/22 122/78  03/29/22 114/68  03/25/22 130/80

## 2022-05-27 ENCOUNTER — Ambulatory Visit (INDEPENDENT_AMBULATORY_CARE_PROVIDER_SITE_OTHER): Payer: Medicare Other | Admitting: Podiatry

## 2022-05-27 ENCOUNTER — Encounter: Payer: Self-pay | Admitting: Podiatry

## 2022-05-27 DIAGNOSIS — M79674 Pain in right toe(s): Secondary | ICD-10-CM | POA: Diagnosis not present

## 2022-05-27 DIAGNOSIS — M79675 Pain in left toe(s): Secondary | ICD-10-CM

## 2022-05-27 DIAGNOSIS — B351 Tinea unguium: Secondary | ICD-10-CM

## 2022-06-03 NOTE — Progress Notes (Signed)
  Subjective:  Patient ID: Toni Mcmillan, female    DOB: [birth year],  MRN: [id number]  Chief Complaint  Patient presents with   Nail Problem    Nail trim    88 y.o. female returns for the above complaint.  Patient presents with thickened elongated dystrophic toenails x10 mild pain on palpation.  Hurts with ambulation.  She denies any other acute complaints.  She would like to have them debrided down she is not able to do it herself  Objective:  There were no vitals filed for this visit. Podiatric Exam: Vascular: dorsalis pedis and posterior tibial pulses are palpable bilateral. Capillary return is immediate. Temperature gradient is WNL. Skin turgor WNL  Sensorium: Normal Semmes Weinstein monofilament test. Normal tactile sensation bilaterally. Nail Exam: Pt has thick disfigured discolored nails with subungual debris noted bilateral entire nail hallux through fifth toenails.  Pain on palpation to the nails. Ulcer Exam: There is no evidence of ulcer or pre-ulcerative changes or infection. Orthopedic Exam: Muscle tone and strength are WNL. No limitations in general ROM. No crepitus or effusions noted.  Skin: No Porokeratosis. No infection or ulcers    Assessment & Plan:   1. Pain due to onychomycosis of toenails of both feet     Patient was evaluated and treated and all questions answered.  Onychomycosis with pain  -Nails palliatively debrided as below. -Educated on self-care  Procedure: Nail Debridement Rationale: pain  Type of Debridement: manual, sharp debridement. Instrumentation: Nail nipper, rotary burr. Number of Nails: 10  Procedures and Treatment: Consent by patient was obtained for treatment procedures. The patient understood the discussion of treatment and procedures well. All questions were answered thoroughly reviewed. Debridement of mycotic and hypertrophic toenails, 1 through 5 bilateral and clearing of subungual debris. No ulceration, no infection noted.  Return  Visit-Office Procedure: Patient instructed to return to the office for a follow up visit 3 months for continued evaluation and treatment.  Nicholes Rough, DPM    Return in about 3 months (around 08/27/2022).

## 2022-07-03 ENCOUNTER — Encounter (HOSPITAL_BASED_OUTPATIENT_CLINIC_OR_DEPARTMENT_OTHER): Payer: Self-pay | Admitting: Emergency Medicine

## 2022-07-03 ENCOUNTER — Other Ambulatory Visit: Payer: Self-pay

## 2022-07-03 ENCOUNTER — Emergency Department (HOSPITAL_BASED_OUTPATIENT_CLINIC_OR_DEPARTMENT_OTHER): Payer: Medicare Other

## 2022-07-03 ENCOUNTER — Emergency Department (HOSPITAL_BASED_OUTPATIENT_CLINIC_OR_DEPARTMENT_OTHER)
Admission: EM | Admit: 2022-07-03 | Discharge: 2022-07-03 | Disposition: A | Discharge status: Home / Self Care | Payer: Medicare Other | Attending: Emergency Medicine | Admitting: Emergency Medicine

## 2022-07-03 ENCOUNTER — Emergency Department (HOSPITAL_BASED_OUTPATIENT_CLINIC_OR_DEPARTMENT_OTHER): Payer: Medicare Other | Admitting: Radiology

## 2022-07-03 DIAGNOSIS — F0390 Unspecified dementia without behavioral disturbance: Secondary | ICD-10-CM | POA: Insufficient documentation

## 2022-07-03 DIAGNOSIS — I4891 Unspecified atrial fibrillation: Secondary | ICD-10-CM | POA: Diagnosis not present

## 2022-07-03 DIAGNOSIS — Y92009 Unspecified place in unspecified non-institutional (private) residence as the place of occurrence of the external cause: Secondary | ICD-10-CM | POA: Insufficient documentation

## 2022-07-03 DIAGNOSIS — R001 Bradycardia, unspecified: Secondary | ICD-10-CM

## 2022-07-03 DIAGNOSIS — S8001XA Contusion of right knee, initial encounter: Secondary | ICD-10-CM | POA: Insufficient documentation

## 2022-07-03 DIAGNOSIS — W01198A Fall on same level from slipping, tripping and stumbling with subsequent striking against other object, initial encounter: Secondary | ICD-10-CM | POA: Diagnosis not present

## 2022-07-03 DIAGNOSIS — S0003XA Contusion of scalp, initial encounter: Secondary | ICD-10-CM | POA: Insufficient documentation

## 2022-07-03 DIAGNOSIS — Z043 Encounter for examination and observation following other accident: Secondary | ICD-10-CM | POA: Diagnosis not present

## 2022-07-03 DIAGNOSIS — D649 Anemia, unspecified: Secondary | ICD-10-CM | POA: Insufficient documentation

## 2022-07-03 DIAGNOSIS — M1711 Unilateral primary osteoarthritis, right knee: Secondary | ICD-10-CM | POA: Diagnosis not present

## 2022-07-03 DIAGNOSIS — I480 Paroxysmal atrial fibrillation: Secondary | ICD-10-CM | POA: Insufficient documentation

## 2022-07-03 DIAGNOSIS — Z7901 Long term (current) use of anticoagulants: Secondary | ICD-10-CM | POA: Insufficient documentation

## 2022-07-03 DIAGNOSIS — E119 Type 2 diabetes mellitus without complications: Secondary | ICD-10-CM | POA: Insufficient documentation

## 2022-07-03 DIAGNOSIS — Z91040 Latex allergy status: Secondary | ICD-10-CM | POA: Diagnosis not present

## 2022-07-03 DIAGNOSIS — S0990XA Unspecified injury of head, initial encounter: Secondary | ICD-10-CM | POA: Diagnosis present

## 2022-07-03 HISTORY — DX: Unspecified atrial fibrillation: I48.91

## 2022-07-03 LAB — CBC WITH DIFFERENTIAL/PLATELET
Abs Immature Granulocytes: 0.03 10*3/uL (ref 0.00–0.07)
Basophils Absolute: 0 10*3/uL (ref 0.0–0.1)
Basophils Relative: 0 %
Eosinophils Absolute: 0.1 10*3/uL (ref 0.0–0.5)
Eosinophils Relative: 1 %
HCT: 34.6 % — ABNORMAL LOW (ref 36.0–46.0)
Hemoglobin: 11.2 g/dL — ABNORMAL LOW (ref 12.0–15.0)
Immature Granulocytes: 0 %
Lymphocytes Relative: 16 %
Lymphs Abs: 1.7 10*3/uL (ref 0.7–4.0)
MCH: 30.2 pg (ref 26.0–34.0)
MCHC: 32.4 g/dL (ref 30.0–36.0)
MCV: 93.3 fL (ref 80.0–100.0)
Monocytes Absolute: 0.6 10*3/uL (ref 0.1–1.0)
Monocytes Relative: 6 %
Neutro Abs: 8.1 10*3/uL — ABNORMAL HIGH (ref 1.7–7.7)
Neutrophils Relative %: 77 %
Platelets: 282 10*3/uL (ref 150–400)
RBC: 3.71 MIL/uL — ABNORMAL LOW (ref 3.87–5.11)
RDW: 12.3 % (ref 11.5–15.5)
WBC: 10.6 10*3/uL — ABNORMAL HIGH (ref 4.0–10.5)
nRBC: 0 % (ref 0.0–0.2)

## 2022-07-03 LAB — BASIC METABOLIC PANEL
Anion gap: 9 (ref 5–15)
BUN: 20 mg/dL (ref 8–23)
CO2: 26 mmol/L (ref 22–32)
Calcium: 9.3 mg/dL (ref 8.9–10.3)
Chloride: 102 mmol/L (ref 98–111)
Creatinine, Ser: 0.69 mg/dL (ref 0.44–1.00)
GFR, Estimated: >60 mL/min (ref >60)
Glucose, Bld: 131 mg/dL — ABNORMAL HIGH (ref 70–99)
Potassium: 4.2 mmol/L (ref 3.5–5.1)
Sodium: 137 mmol/L (ref 135–145)

## 2022-07-03 MED ORDER — SODIUM CHLORIDE 0.9 % IV BOLUS
500.0000 mL | Freq: Once | INTRAVENOUS | Status: AC
  Administered 2022-07-03: 500 mL via INTRAVENOUS

## 2022-07-03 NOTE — ED Provider Notes (Signed)
[facility] EMERGENCY DEPT Provider Note   CSN: [id number] Arrival date & time: 07/03/22  1417     History  Chief Complaint  Patient presents with   Fall   Head Injury    Toni Mcmillan is a 88 y.o. female.   Fall  Head Injury    Patient presents to the ED for evaluation of a fall.  Patient has history of diabetes, headaches, dizziness, A-fib, insomnia.  Patient was brought to the ED by family members.  Patient believes she lost her footing wherein she was wearing her slippers at home.  Patient ended up falling striking the back of her head on the ground.  She also hit her knee on the floor.  Denies having any trouble with chest pain.  She has some mild soreness in the back of her head but has not had any vomiting or severe headache.  She denies any chest pain.  No abdominal pain.  She has not Belling feeling lightheaded or weak.  Family states they have cameras at the house and they observe the fall on the video camera.  Patient seen to be standing when her knee started to tremble and she ended up falling backwards.  They states she was on the floor for about a minute.  Patient is on anticoagulation  Home Medications Prior to Admission medications   Medication Sig Start Date End Date Taking? Authorizing Provider  apixaban (ELIQUIS) 2.5 MG TABS tablet Take 1 tablet (2.5 mg total) by mouth 2 (two) times daily. 03/08/22  Yes Fenton, Clint R, PA  aspirin-acetaminophen-caffeine (EXCEDRIN MIGRAINE) [phone] MG per tablet Take 1-2 tablets by mouth every 6 (six) hours as needed for headache (pain).    [provider]  Cholecalciferol (VITAMIN D-3) 25 MCG (1000 UT) CAPS Take 1,000 Units by mouth daily.    [provider]  donepezil (ARICEPT) 10 MG tablet Take 1 tablet (10 mg total) by mouth at bedtime. 01/06/22 01/06/23  Plotnikov, Georgina Quint, MD  LORazepam (ATIVAN) 0.5 MG tablet Take 1 tablet (0.5 mg total) by mouth at bedtime as needed for anxiety. 11/25/21    Plotnikov, Georgina Quint, MD  Multiple Vitamins-Minerals (ONE-A-DAY WOMENS 50+) TABS Take 1 tablet by mouth daily with breakfast.    [provider]  Omega-3 Fatty Acids (FISH OIL PO) Take 1,000 mg by mouth every morning.    [provider]  pantoprazole (PROTONIX) 40 MG tablet Take 1 tablet (40 mg total) by mouth daily. 12/11/19   Plotnikov, Georgina Quint, MD  Polyvinyl Alcohol-Povidone (REFRESH OP) Place 1-2 drops into both eyes daily as needed (dry eyes).    [provider]  ramipril (ALTACE) 2.5 MG capsule TAKE ONE CAPSULE BY MOUTH DAILY 01/15/22   Plotnikov, Georgina Quint, MD  senna-docusate (SENOKOT-S) 8.6-50 MG tablet Take 2 tablets by mouth 2 (two) times daily. 11/25/21 11/25/22  Plotnikov, Georgina Quint, MD  simvastatin (ZOCOR) 40 MG tablet Take 1 tablet (40 mg total) by mouth daily at 6 PM. 04/02/22   Plotnikov, Georgina Quint, MD  vitamin C (ASCORBIC ACID) 500 MG tablet Take 500 mg by mouth daily.    [provider]  Wheat Dextrin (BENEFIBER PO) Take by mouth See admin instructions. Mix 1 teaspoonful of powder into 4-8 ounces of water and drink once a day as needed for mild constipation    [provider]      Allergies    Celecoxib, Ciprofloxacin, Cleocin [clindamycin], Clindamycin hcl, Erythromycin, Gabapentin, Meperidine, Meperidine hcl, Morphine, Morphine sulfate,  Tetracycline, Triprolidine-pseudoephedrine, Zolpidem, and Latex    Review of Systems   Review of Systems  Physical Exam Updated Vital Signs BP (!) 166/80   Pulse (!) 53   Temp 98.7 F (37.1 C)   Resp 15   Ht 1.575 m (5\' 2" )   Wt 48.1 kg   SpO2 100%   BMI 19.40 kg/m  Physical Exam Vitals and nursing note reviewed.  Constitutional:      Appearance: She is well-developed. She is not diaphoretic.  HENT:     Head: Normocephalic.     Comments: Mild tenderness right posterior occiput    Right Ear: External ear normal.     Left Ear: External ear normal.  Eyes:     General: No scleral  icterus.       Right eye: No discharge.        Left eye: No discharge.     Conjunctiva/sclera: Conjunctivae normal.  Neck:     Trachea: No tracheal deviation.  Cardiovascular:     Rate and Rhythm: Normal rate and regular rhythm.  Pulmonary:     Effort: Pulmonary effort is normal. No respiratory distress.     Breath sounds: Normal breath sounds. No stridor. No wheezing or rales.  Abdominal:     General: Bowel sounds are normal. There is no distension.     Palpations: Abdomen is soft.     Tenderness: There is no abdominal tenderness. There is no guarding or rebound.  Musculoskeletal:        General: Tenderness present. No deformity.     Cervical back: Neck supple.     Comments: Bruising noted around the right knee no cervical thoracic or lumbar spine tenderness  Skin:    General: Skin is warm and dry.     Findings: No rash.  Neurological:     General: No focal deficit present.     Mental Status: She is alert.     Cranial Nerves: No cranial nerve deficit (no facial droop, extraocular movements intact, no slurred speech).     Sensory: No sensory deficit.     Motor: No abnormal muscle tone or seizure activity.     Coordination: Coordination normal.     Comments: Normal speech, equal grip strength, able to lift both legs without difficulty, no facial droop  Psychiatric:        Mood and Affect: Mood normal.     ED Results / Procedures / Treatments   Labs (all labs ordered are listed, but only abnormal results are displayed) Labs Reviewed  BASIC METABOLIC PANEL - Abnormal; Notable for the following components:      Result Value   Glucose, Bld 131 (*)    All other components within normal limits  CBC WITH DIFFERENTIAL/PLATELET - Abnormal; Notable for the following components:   WBC 10.6 (*)    RBC 3.71 (*)    Hemoglobin 11.2 (*)    HCT 34.6 (*)    Neutro Abs 8.1 (*)    All other components within normal limits  CBG MONITORING, ED    EKG EKG  Interpretation  Date/Time:  Saturday July 03 2022 16:20:43 EDT Ventricular Rate:  49 PR Interval:  221 QRS Duration: 101 QT Interval:  455 QTC Calculation: 411 R Axis:   -21 Text Interpretation: Sinus bradycardia Multiple ventricular premature complexes Borderline left axis deviation No significant change since last tracing except pvcs Confirmed by Linwood DibblesKnapp, Jashon Ishida [phone]([id number]) on 07/03/2022 4:23:48 PM  Radiology CT HEAD WO CONTRAST  Result Date: 07/03/2022  CLINICAL DATA:  Patient arrives with daughter who states that the patient was seen on home cameras losing her footing, falling backwards hitting her right knee and the back of her head. EXAM: CT HEAD WITHOUT CONTRAST CT CERVICAL SPINE WITHOUT CONTRAST TECHNIQUE: Multidetector CT imaging of the head and cervical spine was performed following the standard protocol without intravenous contrast. Multiplanar CT image reconstructions of the cervical spine were also generated. RADIATION DOSE REDUCTION: This exam was performed according to the departmental dose-optimization program which includes automated exposure control, adjustment of the mA and/or kV according to patient size and/or use of iterative reconstruction technique. COMPARISON:  Head CT, 01/29/2021. FINDINGS: CT HEAD FINDINGS Brain: No evidence of acute infarction, hemorrhage, hydrocephalus, extra-axial collection or mass lesion/mass effect. Vascular: No hyperdense vessel or unexpected calcification. Skull: Normal. Negative for fracture or focal lesion. Sinuses/Orbits: Visualized globes and orbits are unremarkable. Visualized sinuses are clear. Other: Right parietal scalp hematoma. CT CERVICAL SPINE FINDINGS Alignment: Mild reversal the normal cervical lordosis, apex at C4. No spondylolisthesis. Skull base and vertebrae: No acute fracture. No primary bone lesion or focal pathologic process. Soft tissues and spinal canal: No prevertebral fluid or swelling. No visible canal hematoma. Disc levels: Moderate  loss of disc height at C3-C4 and C5-C6. Marked loss of disc height at C4-C5 and C6-C7. Mild disc bulging and endplate spurring. No disc herniation. Upper chest: No acute or significant abnormality. Other: None. IMPRESSION: HEAD CT 1. No acute intracranial abnormalities. 2. Right parietal scalp hematoma.  No skull fracture. CERVICAL CT 1. No fracture or acute finding. Electronically Signed   By: Amie Portland M.D.   On: 07/03/2022 16:30   CT CERVICAL SPINE WO CONTRAST  Result Date: 07/03/2022 CLINICAL DATA:  Patient arrives with daughter who states that the patient was seen on home cameras losing her footing, falling backwards hitting her right knee and the back of her head. EXAM: CT HEAD WITHOUT CONTRAST CT CERVICAL SPINE WITHOUT CONTRAST TECHNIQUE: Multidetector CT imaging of the head and cervical spine was performed following the standard protocol without intravenous contrast. Multiplanar CT image reconstructions of the cervical spine were also generated. RADIATION DOSE REDUCTION: This exam was performed according to the departmental dose-optimization program which includes automated exposure control, adjustment of the mA and/or kV according to patient size and/or use of iterative reconstruction technique. COMPARISON:  Head CT, 01/29/2021. FINDINGS: CT HEAD FINDINGS Brain: No evidence of acute infarction, hemorrhage, hydrocephalus, extra-axial collection or mass lesion/mass effect. Vascular: No hyperdense vessel or unexpected calcification. Skull: Normal. Negative for fracture or focal lesion. Sinuses/Orbits: Visualized globes and orbits are unremarkable. Visualized sinuses are clear. Other: Right parietal scalp hematoma. CT CERVICAL SPINE FINDINGS Alignment: Mild reversal the normal cervical lordosis, apex at C4. No spondylolisthesis. Skull base and vertebrae: No acute fracture. No primary bone lesion or focal pathologic process. Soft tissues and spinal canal: No prevertebral fluid or swelling. No visible  canal hematoma. Disc levels: Moderate loss of disc height at C3-C4 and C5-C6. Marked loss of disc height at C4-C5 and C6-C7. Mild disc bulging and endplate spurring. No disc herniation. Upper chest: No acute or significant abnormality. Other: None. IMPRESSION: HEAD CT 1. No acute intracranial abnormalities. 2. Right parietal scalp hematoma.  No skull fracture. CERVICAL CT 1. No fracture or acute finding. Electronically Signed   By: Amie Portland M.D.   On: 07/03/2022 16:30   DG Knee 2 Views Right  Result Date: 07/03/2022 CLINICAL DATA:  Syncope, fall EXAM: RIGHT KNEE - 1-2 VIEW COMPARISON:  None Available. FINDINGS: No fracture or dislocation of the right knee. Mild tricompartmental joint space narrowing without significant osteophytosis. Chondrocalcinosis and probable loose bodies in the posterior joint space. Vascular calcinosis. IMPRESSION: 1.  No fracture or dislocation of the right knee. 2. Mild tricompartmental arthrosis. Chondrocalcinosis and probable loose bodies in the posterior joint space. Electronically Signed   By: Jearld Lesch M.D.   On: 07/03/2022 16:29   DG Chest 1 View  Result Date: 07/03/2022 CLINICAL DATA:  Complains of fall. EXAM: CHEST  1 VIEW COMPARISON:  03/05/2022 FINDINGS: Heart size and mediastinal contours are unremarkable. Aortic atherosclerotic calcifications. No pleural effusion or edema. No airspace opacities identified. The visualized osseous structures are unremarkable. IMPRESSION: No acute cardiopulmonary abnormalities. Aortic Atherosclerosis (ICD10-I70.0). Electronically Signed   By: Signa Kell M.D.   On: 07/03/2022 16:29    Procedures Procedures    Medications Ordered in ED Medications  sodium chloride 0.9 % bolus 500 mL (500 mLs Intravenous New Bag/Given 07/03/22 1633)    ED Course/ Medical Decision Making/ A&P Clinical Course as of 07/03/22 1744  Sat Jul 03, 2022  1702 CBC WITH DIFFERENTIAL(!) Anemia stable [JK]  1702 Basic metabolic panel(!) No  significant electrolyte abnormalities [JK]  1703 No acute findings on head or C-spine CT other than scalp hematoma [JK]  1703 DG Knee 2 Views Right The x-ray without fracture [JK]  1703 DG Chest 1 View Chest x-ray without acute findings [JK]  1721 HR down to the 40s at the bedside [JK]    Clinical Course User Index [JK] Linwood Dibbles, MD                           Medical Decision Making Problems Addressed: Bradycardia: acute illness or injury that poses a threat to life or bodily functions Contusion of right knee, initial encounter: acute illness or injury Contusion of scalp, initial encounter: acute illness or injury PAF (paroxysmal atrial fibrillation) (HCC): chronic illness or injury  Amount and/or Complexity of Data Reviewed Labs: ordered. Decision-making details documented in ED Course. Radiology: ordered and independent interpretation performed. Decision-making details documented in ED Course. ECG/medicine tests: ordered.  Risk Decision regarding hospitalization.   Patient presented to the ED for evaluation after a fall.  Fortunately no signs of any serious injuries associated with her fall.  Patient feels like she just slipped but family feels like she may have had a syncopal episode.  Patient does have some cognitive issues due to dementia.  She however denies that she passed out.  She thinks that her foot just slipped.  Family did observe her on the video camera lying on the ground for a minute.  It looks like her leg started to shake before she fell.  I suspect she did have a syncopal episode.  While the patient was in the ED she has been bradycardic.  Heart rates have dropped down into the 40s.  I discussed admission to the hospital for further evaluation.  Patient does not want to be admitted.  She wants to go home.  Family is at the bedside during this discussion.  Patient is on metoprolol which could be contributing to the bradycardia.  I will have patient's stop her  metoprolol.  Have her follow-up with her cardiologist to make sure that her bradycardia improves.  Warning signs and precautions discussed regarding returning to the ED for any recurrent symptoms.        Final Clinical Impression(s) / ED Diagnoses  Final diagnoses:  Bradycardia  Atrial fibrillation, unspecified type (HCC)  Contusion of scalp, initial encounter  Contusion of right knee, initial encounter  PAF (paroxysmal atrial fibrillation) (HCC)    Rx / DC Orders ED Discharge Orders          Ordered    Ambulatory referral to Cardiology       Comments: If you have not heard from the Cardiology office within the next 72 hours please call [phone].   07/03/22 1742              Linwood Dibbles, MD 07/03/22 1744

## 2022-07-03 NOTE — Discharge Instructions (Addendum)
Stop taking your metoprolol.  It could be contributing to your slow heart rate and the possible fainting spell you had earlier.  Follow-up with your cardiologist for further evaluation.  Return to the ED if you have any recurrent spells of feeling lightheaded or dizzy

## 2022-07-03 NOTE — ED Triage Notes (Addendum)
Patient arrives with complaints of fall.  Patient arrives with daughter who states that the patient was seen on home cameras losing her footing, falling backwards hitting her right knee and the back of her head.   Patient does take eliquis twice daily.

## 2022-07-05 NOTE — Progress Notes (Unsigned)
Office Visit    Patient Name: Toni Mcmillan Date of Encounter: 07/07/2022  PCP:  Tresa Garter, MD   [facility]  Cardiologist:  Nanetta Batty, MD  Advanced Practice Provider:  No care team member to display Electrophysiologist:  None   HPI    Toni Mcmillan is a 88 y.o. female with a past medical history significant for essential hypertension, dyslipidemia, palpitations, PAF, coronary artery calcification seen on CT scan, diabetes mellitus, and atrial fibrillation  presents today for hospital follow-up.  She was seen 6 years ago for dizziness.  She had some recent dizziness while standing in the kitchen with tachycardia palpitations when she felt her pulse.  She did not have any chest pain.  Monitor was obtained which showed only PVCs at that time.  2D echocardiogram was normal.  She presented to the emergency room with a syncopal episode 03/04/2022.  EMS documented that she was in atrial fibrillation with variable ventricular response from 110 to 160 bpm but converted to normal sinus rhythm when she arrived.  She had a CT of the chest which showed no pulmonary embolism but did show severe coronary calcifications.  She was seen in the A-fib clinic 03/08/2022 and was placed on low-dose Eliquis for oral anticoagulation.  Current discussion with her daughter was living situation now that her husband passed away.  She was recently seen in the ED for evaluation of fall.  She believes that she lost her footing since she was wearing slippers in her home.  She ended up falling striking the back of her head on the ground.  She also hit her knee on the floor.  She denies any chest pain.  She has some mild soreness but no vomiting or severe headache.  Her heart rates had dropped down into the 40s.  Her metoprolol was discontinued.  Today, she states that she does not member having any particular palpitations before her fall.  She does not remember much about that  day.  We discussed a couple options to work-up for syncopal event.  We reviewed her labs from the ER which all looked okay.  She states she was asymptomatic with her sinus bradycardia.  Otherwise, she is doing okay today.  No other significant events.  EKG from her hospitalization showed sinus bradycardia, rate 49 with multiple PVCs.  Reports no shortness of breath nor dyspnea on exertion. Reports no chest pain, pressure, or tightness. No edema, orthopnea, PND. Reports no palpitations.    Past Medical History    Past Medical History:  Diagnosis Date   A-fib (HCC)    Allergic rhinitis, cause unspecified    Diabetes mellitus without complication (HCC)    borderline"never on meds"   Disturbances of sensation of smell and taste    Dizziness    Headache(784.0)    has decreased to none in later years   Insomnia, unspecified    Other and unspecified hyperlipidemia    Personal history of other diseases of digestive system    Scarlet fever    Unspecified essential hypertension    Metoprolol decreased due to drop in BP readings.   Unspecified sinusitis (chronic)    Whooping cough, unspecified organism    Past Surgical History:  Procedure Laterality Date   ABDOMINAL HYSTERECTOMY     ANAL RECTAL MANOMETRY N/A 07/07/2015   Procedure: ANO RECTAL MANOMETRY;  Surgeon: Charolett Bumpers, MD;  Location: [facility];  Service: Endoscopy;  Laterality: N/A;   BILATERAL OOPHORECTOMY  CATARACT EXTRACTION, BILATERAL     COLONOSCOPY W/ POLYPECTOMY     COLONOSCOPY WITH PROPOFOL N/A 05/29/2015   Procedure: COLONOSCOPY WITH PROPOFOL;  Surgeon: Charolett Bumpers, MD;  Location: [facility];  Service: Endoscopy;  Laterality: N/A;   correction of hammertoe deformity     CYSTOSCOPY     w/ stenting of both ureters   HEMORRHOID SURGERY     hydrosalpinx diagnostic laparoscopy     TONSILLECTOMY  age 31   vericose vein surgery      Allergies  Allergies  Allergen Reactions   Celecoxib Other (See  Comments)    Reaction not recalled    Ciprofloxacin Swelling and Other (See Comments)    Caused wrist swelling   Cleocin [Clindamycin] Itching   Clindamycin Hcl Itching   Erythromycin Other (See Comments)    GI upset   Gabapentin Other (See Comments)    Made the patient feel not well the next day   Meperidine Itching   Meperidine Hcl Itching   Morphine Itching   Morphine Sulfate Itching   Tetracycline Other (See Comments)    GI upset   Triprolidine-Pseudoephedrine Other (See Comments)    Reaction not recalled   Zolpidem Other (See Comments)    "Did not have a good sleep"   Latex Itching and Rash     EKGs/Labs/Other Studies Reviewed:   The following studies were reviewed today:  CT angio of the chest 03/05/22  EXAM: CT ANGIOGRAPHY CHEST WITH CONTRAST   TECHNIQUE: Multidetector CT imaging of the chest was performed using the standard protocol during bolus administration of intravenous contrast. Multiplanar CT image reconstructions and MIPs were obtained to evaluate the vascular anatomy.   RADIATION DOSE REDUCTION: This exam was performed according to the departmental dose-optimization program which includes automated exposure control, adjustment of the mA and/or kV according to patient size and/or use of iterative reconstruction technique.   CONTRAST:  OMNIPAQUE IOHEXOL 350 MG/ML SOLN   COMPARISON:  None.   FINDINGS: Cardiovascular: There is moderate to marked severity calcification of the thoracic aorta. The ascending thoracic aorta measures approximately 4.1 cm in diameter. Satisfactory opacification of the pulmonary arteries to the segmental level. No evidence of pulmonary embolism. Normal heart size with marked severity coronary artery calcification. No pericardial effusion.   Mediastinum/Nodes: No enlarged mediastinal, hilar, or axillary lymph nodes. Thyroid gland, trachea, and esophagus demonstrate no significant findings.   Lungs/Pleura: Mild  atelectasis is seen along the posterior aspect of the bilateral lower lobes.   There is no evidence of acute infiltrate, pleural effusion or pneumothorax.   Upper Abdomen: Multiple tiny gallstones are seen within the lumen of a moderately distended gallbladder.   Noninflamed diverticula are seen throughout the visualized portion of the large bowel.   Musculoskeletal: No chest wall abnormality. No acute or significant osseous findings.   Review of the MIP images confirms the above findings.   IMPRESSION: 1. No evidence of pulmonary embolism or other acute intrathoracic process. 2. Marked severity coronary artery disease. 3. Cholelithiasis. 4. Colonic diverticulosis.   Aortic Atherosclerosis (ICD10-I70.0).     Electronically Signed   By: Aram Candela M.D.   On: 03/05/2022 22:06  EKG:  EKG is not ordered today.   Recent Labs: 03/04/2022: Magnesium 1.9; TSH 1.503 03/05/2022: ALT 12; B Natriuretic Peptide 119.2 07/03/2022: BUN 20; Creatinine, Ser 0.69; Hemoglobin 11.2; Platelets 282; Potassium 4.2; Sodium 137  Recent Lipid Panel    Component Value Date/Time   CHOL 126 02/12/2022 1703  TRIG 147.0 02/12/2022 1703   HDL 45.10 02/12/2022 1703   CHOLHDL 3 02/12/2022 1703   VLDL 29.4 02/12/2022 1703   LDLCALC 52 02/12/2022 1703   LDLDIRECT 84.0 12/11/2019 1208    Risk Assessment/Calculations:   CHA2DS2-VASc Score = 6   This indicates a 9.7% annual risk of stroke. The patient's score is based upon: CHF History: 0 HTN History: 1 Diabetes History: 1 Stroke History: 0 Vascular Disease History: 1 Age Score: 2 Gender Score: 1     Home Medications   Current Meds  Medication Sig   apixaban (ELIQUIS) 2.5 MG TABS tablet Take 1 tablet (2.5 mg total) by mouth 2 (two) times daily.   aspirin-acetaminophen-caffeine (EXCEDRIN MIGRAINE) 250-250-65 MG tablet Take 2 tablets by mouth as needed for headache. Pt takes 250-65 mg as needed.   Cholecalciferol (VITAMIN D-3) 25 MCG  (1000 UT) CAPS Take 1,000 Units by mouth daily.   donepezil (ARICEPT) 10 MG tablet Take 1 tablet (10 mg total) by mouth at bedtime.   Multiple Vitamins-Minerals (ONE-A-DAY WOMENS 50+) TABS Take 1 tablet by mouth daily with breakfast.   Omega-3 Fatty Acids (FISH OIL PO) Take 1,000 mg by mouth every morning.   pantoprazole (PROTONIX) 40 MG tablet Take 1 tablet (40 mg total) by mouth daily.   Polyvinyl Alcohol-Povidone (REFRESH OP) Place 1-2 drops into both eyes daily as needed (dry eyes).   ramipril (ALTACE) 2.5 MG capsule TAKE ONE CAPSULE BY MOUTH DAILY   senna-docusate (SENOKOT-S) 8.6-50 MG tablet Take 2 tablets by mouth 2 (two) times daily.   simvastatin (ZOCOR) 40 MG tablet Take 1 tablet (40 mg total) by mouth daily at 6 PM.   vitamin C (ASCORBIC ACID) 500 MG tablet Take 500 mg by mouth daily.   Wheat Dextrin (BENEFIBER PO) Take by mouth See admin instructions. Mix 1 teaspoonful of powder into 4-8 ounces of water and drink once a day as needed for mild constipation     Review of Systems      All other systems reviewed and are otherwise negative except as noted above.  Physical Exam    VS:  BP 138/64   Pulse 87   Ht 5\' 2"  (1.575 m)   Wt 106 lb 3.2 oz (48.2 kg)   SpO2 97%   BMI 19.42 kg/m  , BMI Body mass index is 19.42 kg/m.  Wt Readings from Last 3 Encounters:  07/06/22 106 lb 3.2 oz (48.2 kg)  07/03/22 106 lb 0.7 oz (48.1 kg)  05/25/22 106 lb (48.1 kg)     GEN: Well nourished, well developed, in no acute distress. HEENT: normal. Neck: Supple, no JVD, carotid bruits, or masses. Cardiac: RRR, no murmurs, rubs, or gallops. No clubbing, cyanosis, edema.  Radials/PT 2+ and equal bilaterally.  Respiratory:  Respirations regular and unlabored, clear to auscultation bilaterally. GI: Soft, nontender, nondistended. MS: No deformity or atrophy. Skin: Warm and dry, no rash. Neuro:  Strength and sensation are intact. Psych: Normal affect.  Assessment & Plan    Syncope -unknown  culprit  -will order an echocardiogram and zio patch for further workup  Bradycardia -HR better today in the 80s -avoiding nodal agents -zio patch for further workup  Essential hypertension -well controlled today -Continue ramipril 2.5 mg daily  Dyslipidemia -Lipid panel showed LDL 52, HDL 45, triglycerides 147, and total cholesterol 126 -Continue simvastatin 40 mg daily  PAF -asymptomatic when it happens -On Eliquis 2.5 mg BID, no bleeding issues  Coronary artery calcification seen on CT scan -  no anginal symptoms -continue current medications        Disposition: Follow up 6 weeks with Nanetta Batty, MD or APP.  Signed, Sharlene Dory, PA-C 07/07/2022, 12:15 PM  [facility]

## 2022-07-06 ENCOUNTER — Ambulatory Visit: Payer: Medicare Other | Admitting: Physician Assistant

## 2022-07-06 ENCOUNTER — Encounter: Payer: Self-pay | Admitting: Physician Assistant

## 2022-07-06 VITALS — BP 138/64 | HR 87 | Ht 62.0 in | Wt 106.2 lb

## 2022-07-06 DIAGNOSIS — I2510 Atherosclerotic heart disease of native coronary artery without angina pectoris: Secondary | ICD-10-CM | POA: Diagnosis not present

## 2022-07-06 DIAGNOSIS — I480 Paroxysmal atrial fibrillation: Secondary | ICD-10-CM | POA: Diagnosis not present

## 2022-07-06 DIAGNOSIS — E785 Hyperlipidemia, unspecified: Secondary | ICD-10-CM

## 2022-07-06 DIAGNOSIS — R001 Bradycardia, unspecified: Secondary | ICD-10-CM

## 2022-07-06 DIAGNOSIS — R55 Syncope and collapse: Secondary | ICD-10-CM

## 2022-07-06 DIAGNOSIS — I2583 Coronary atherosclerosis due to lipid rich plaque: Secondary | ICD-10-CM

## 2022-07-06 DIAGNOSIS — R002 Palpitations: Secondary | ICD-10-CM

## 2022-07-06 NOTE — Patient Instructions (Addendum)
Medication Instructions:  Your physician recommends that you continue on your current medications as directed. Please refer to the Current Medication list given to you today.  *If you need a refill on your cardiac medications before your next appointment, please call your pharmacy*   Lab Work: None If you have labs (blood work) drawn today and your tests are completely normal, you will receive your results only by: MyChart Message (if you have MyChart) OR A paper copy in the mail If you have any lab test that is abnormal or we need to change your treatment, we will call you to review the results.   Testing/Procedures: Your physician has requested that you have an echocardiogram on 07/08/2022 at 2:00 PM. Echocardiography is a painless test that uses sound waves to create images of your heart. It provides your doctor with information about the size and shape of your heart and how well your heart's chambers and valves are working. This procedure takes approximately one hour. There are no restrictions for this procedure.   ZIO XT- Long Term Monitor Instructions  Your physician has requested you wear a ZIO patch monitor for 14 days.  This is a single patch monitor. [employer] supplies one patch monitor per enrollment. Additional stickers are not available. Please do not apply patch if you will be having a Nuclear Stress Test,  Echocardiogram, Cardiac CT, MRI, or Chest Xray during the period you would be wearing the  monitor. The patch cannot be worn during these tests. You cannot remove and re-apply the  ZIO XT patch monitor.  Your ZIO patch monitor will be mailed 3 day USPS to your address on file. It may take 3-5 days  to receive your monitor after you have been enrolled.  Once you have received your monitor, please review the enclosed instructions. Your monitor  has already been registered assigning a specific monitor serial # to you.  Billing and Patient Assistance Program Information  We  have supplied [employer] with any of your insurance information on file for billing purposes. [employer] offers a sliding scale Patient Assistance Program for patients that do not have  insurance, or whose insurance does not completely cover the cost of the ZIO monitor.  You must apply for the Patient Assistance Program to qualify for this discounted rate.  To apply, please call [employer] at [phone], select option 4, select option 2, ask to apply for  Patient Assistance Program. Meredeth Ide will ask your household income, and how many people  are in your household. They will quote your out-of-pocket cost based on that information.  [employer] will also be able to set up a 39-month, interest-free payment plan if needed.  Applying the monitor   Shave hair from upper left chest.  Hold abrader disc by orange tab. Rub abrader in 40 strokes over the upper left chest as  indicated in your monitor instructions.  Clean area with 4 enclosed alcohol pads. Let dry.  Apply patch as indicated in monitor instructions. Patch will be placed under collarbone on left  side of chest with arrow pointing upward.  Rub patch adhesive wings for 2 minutes. Remove white label marked "1". Remove the white  label marked "2". Rub patch adhesive wings for 2 additional minutes.  While looking in a mirror, press and release button in center of patch. A small green light will  flash 3-4 times. This will be your only indicator that the monitor has been turned on.  Do not shower for the first 24 hours.  You may shower after the first 24 hours.  Press the button if you feel a symptom. You will hear a small click. Record Date, Time and  Symptom in the Patient Logbook.  When you are ready to remove the patch, follow instructions on the last 2 pages of Patient  Logbook. Stick patch monitor onto the last page of Patient Logbook.  Place Patient Logbook in the blue and white box. Use locking tab on box and tape box closed  securely. The blue  and white box has prepaid postage on it. Please place it in the mailbox as  soon as possible. Your physician should have your test results approximately 7 days after the  monitor has been mailed back to [facility].  Call [facility] Customer Care at [phone] if you have questions regarding  your ZIO XT patch monitor. Call them immediately if you see an orange light blinking on your  monitor.  If your monitor falls off in less than 4 days, contact our Monitor department at [phone].  If your monitor becomes loose or falls off after 4 days call [employer] at [phone] for  suggestions on securing your monitor    Follow-Up: At [facility] you and your health needs are our priority.  As part of our continuing mission to provide you with exceptional heart care, we have created designated Provider Care Teams.  These Care Teams include your primary Cardiologist (physician) and Advanced Practice Providers (APPs -  Physician Assistants and Nurse Practitioners) who all work together to provide you with the care you need, when you need it.    Your next appointment:   6 week(s)  The format for your next appointment:   In Person  Provider:   Nanetta Batty, MD {  Important Information About Sugar

## 2022-07-07 ENCOUNTER — Telehealth: Payer: Self-pay | Admitting: *Deleted

## 2022-07-07 ENCOUNTER — Encounter: Payer: Self-pay | Admitting: Physician Assistant

## 2022-07-07 NOTE — Telephone Encounter (Signed)
Asked by Ms. Oliver to call son Trey Paula and inform him of monitor appointment 07/08/22 at 3:00 PM to have ZIO XT monitor applied after 2:00 Echo.

## 2022-07-08 ENCOUNTER — Ambulatory Visit (HOSPITAL_COMMUNITY): Payer: Medicare Other | Attending: Physician Assistant

## 2022-07-08 ENCOUNTER — Ambulatory Visit (INDEPENDENT_AMBULATORY_CARE_PROVIDER_SITE_OTHER): Payer: Medicare Other

## 2022-07-08 DIAGNOSIS — R002 Palpitations: Secondary | ICD-10-CM | POA: Diagnosis not present

## 2022-07-08 DIAGNOSIS — R55 Syncope and collapse: Secondary | ICD-10-CM | POA: Insufficient documentation

## 2022-07-08 LAB — ECHOCARDIOGRAM COMPLETE
Area-P 1/2: 3.05 cm2
S' Lateral: 2.8 cm

## 2022-07-08 NOTE — Progress Notes (Unsigned)
ZIO XT serial # [id number] from office inventory applied to patient using tincture of benzoin as skin protectant.  Dr. Allyson Sabal to read.

## 2022-07-22 ENCOUNTER — Encounter: Payer: Self-pay | Admitting: Internal Medicine

## 2022-08-04 ENCOUNTER — Telehealth: Payer: Self-pay | Admitting: Physician Assistant

## 2022-08-04 DIAGNOSIS — R002 Palpitations: Secondary | ICD-10-CM | POA: Diagnosis not present

## 2022-08-04 DIAGNOSIS — R55 Syncope and collapse: Secondary | ICD-10-CM | POA: Diagnosis not present

## 2022-08-04 NOTE — Telephone Encounter (Signed)
Brayton Layman is calling to report abnormal zio results. Call transferred to triage.

## 2022-08-04 NOTE — Telephone Encounter (Signed)
Patient's daughter states she is returning a call--may be regarding Zio monitor results. Please advise.

## 2022-08-04 NOTE — Telephone Encounter (Addendum)
Received call from [place] at [facility]. On 8/29, patient had SVT rate of 193 BPM. Contacted patient and daughter who reported that the entire time patient wore monitor, she was asymptomatic. Dr. Gwenlyn Found was shown the rhythm and signed off on it. Patient has an appointment already scheduled with Dr. Gwenlyn Found on 09/03/22.

## 2022-08-11 ENCOUNTER — Telehealth: Payer: Self-pay | Admitting: Internal Medicine

## 2022-08-11 NOTE — Telephone Encounter (Signed)
LVM for pt to rtn my call to schedule AWV with [facility] call back # [phone] 

## 2022-08-14 ENCOUNTER — Other Ambulatory Visit: Payer: Self-pay | Admitting: Internal Medicine

## 2022-08-16 ENCOUNTER — Telehealth: Payer: Self-pay | Admitting: Cardiovascular Disease

## 2022-08-16 ENCOUNTER — Emergency Department (HOSPITAL_BASED_OUTPATIENT_CLINIC_OR_DEPARTMENT_OTHER): Payer: Medicare Other

## 2022-08-16 ENCOUNTER — Encounter (HOSPITAL_COMMUNITY): Payer: Self-pay

## 2022-08-16 ENCOUNTER — Encounter: Payer: Self-pay | Admitting: Cardiovascular Disease

## 2022-08-16 ENCOUNTER — Emergency Department (HOSPITAL_BASED_OUTPATIENT_CLINIC_OR_DEPARTMENT_OTHER)
Admission: EM | Admit: 2022-08-16 | Discharge: 2022-08-16 | Discharge status: Against Medical Advice | Payer: Medicare Other | Attending: Emergency Medicine | Admitting: Emergency Medicine

## 2022-08-16 ENCOUNTER — Other Ambulatory Visit: Payer: Self-pay

## 2022-08-16 DIAGNOSIS — R55 Syncope and collapse: Secondary | ICD-10-CM | POA: Diagnosis present

## 2022-08-16 DIAGNOSIS — I10 Essential (primary) hypertension: Secondary | ICD-10-CM | POA: Diagnosis not present

## 2022-08-16 DIAGNOSIS — Z79899 Other long term (current) drug therapy: Secondary | ICD-10-CM | POA: Insufficient documentation

## 2022-08-16 DIAGNOSIS — H5711 Ocular pain, right eye: Secondary | ICD-10-CM | POA: Insufficient documentation

## 2022-08-16 DIAGNOSIS — Z5329 Procedure and treatment not carried out because of patient's decision for other reasons: Secondary | ICD-10-CM | POA: Insufficient documentation

## 2022-08-16 DIAGNOSIS — R42 Dizziness and giddiness: Secondary | ICD-10-CM | POA: Diagnosis not present

## 2022-08-16 DIAGNOSIS — E1140 Type 2 diabetes mellitus with diabetic neuropathy, unspecified: Secondary | ICD-10-CM | POA: Insufficient documentation

## 2022-08-16 DIAGNOSIS — Z91040 Latex allergy status: Secondary | ICD-10-CM | POA: Insufficient documentation

## 2022-08-16 DIAGNOSIS — Z7901 Long term (current) use of anticoagulants: Secondary | ICD-10-CM | POA: Diagnosis not present

## 2022-08-16 DIAGNOSIS — Z7982 Long term (current) use of aspirin: Secondary | ICD-10-CM | POA: Insufficient documentation

## 2022-08-16 DIAGNOSIS — R519 Headache, unspecified: Secondary | ICD-10-CM | POA: Diagnosis not present

## 2022-08-16 LAB — CBC WITH DIFFERENTIAL/PLATELET
Abs Immature Granulocytes: 0.02 10*3/uL (ref 0.00–0.07)
Basophils Absolute: 0.1 10*3/uL (ref 0.0–0.1)
Basophils Relative: 1 %
Eosinophils Absolute: 0.1 10*3/uL (ref 0.0–0.5)
Eosinophils Relative: 1 %
HCT: 34.2 % — ABNORMAL LOW (ref 36.0–46.0)
Hemoglobin: 10.9 g/dL — ABNORMAL LOW (ref 12.0–15.0)
Immature Granulocytes: 0 %
Lymphocytes Relative: 23 %
Lymphs Abs: 2.1 10*3/uL (ref 0.7–4.0)
MCH: 29.6 pg (ref 26.0–34.0)
MCHC: 31.9 g/dL (ref 30.0–36.0)
MCV: 92.9 fL (ref 80.0–100.0)
Monocytes Absolute: 0.5 10*3/uL (ref 0.1–1.0)
Monocytes Relative: 6 %
Neutro Abs: 6.3 10*3/uL (ref 1.7–7.7)
Neutrophils Relative %: 69 %
Platelets: 319 10*3/uL (ref 150–400)
RBC: 3.68 MIL/uL — ABNORMAL LOW (ref 3.87–5.11)
RDW: 12.9 % (ref 11.5–15.5)
WBC: 9.1 10*3/uL (ref 4.0–10.5)
nRBC: 0 % (ref 0.0–0.2)

## 2022-08-16 LAB — BASIC METABOLIC PANEL
Anion gap: 10 (ref 5–15)
BUN: 15 mg/dL (ref 8–23)
CO2: 26 mmol/L (ref 22–32)
Calcium: 9.8 mg/dL (ref 8.9–10.3)
Chloride: 103 mmol/L (ref 98–111)
Creatinine, Ser: 0.74 mg/dL (ref 0.44–1.00)
GFR, Estimated: >60 mL/min (ref >60)
Glucose, Bld: 130 mg/dL — ABNORMAL HIGH (ref 70–99)
Potassium: 4.4 mmol/L (ref 3.5–5.1)
Sodium: 139 mmol/L (ref 135–145)

## 2022-08-16 LAB — TROPONIN I (HIGH SENSITIVITY)
Troponin I (High Sensitivity): 10 ng/L (ref <18)
Troponin I (High Sensitivity): 17 ng/L (ref <18)

## 2022-08-16 MED ORDER — FLUORESCEIN SODIUM 1 MG OP STRP
1.0000 | ORAL_STRIP | Freq: Once | OPHTHALMIC | Status: AC
  Administered 2022-08-16: 1 via OPHTHALMIC
  Filled 2022-08-16: qty 1

## 2022-08-16 MED ORDER — TETRACAINE HCL 0.5 % OP SOLN
1.0000 [drp] | Freq: Once | OPHTHALMIC | Status: AC
  Administered 2022-08-16: 1 [drp] via OPHTHALMIC
  Filled 2022-08-16: qty 4

## 2022-08-16 NOTE — ED Notes (Addendum)
Episode occurred around noon today.... No current dizziness, pain, blurry vision. No obvious signs of gaze or visual problems.

## 2022-08-16 NOTE — ED Provider Notes (Signed)
[facility] EMERGENCY DEPT Provider Note  CSN: [id number] Arrival date & time: 08/16/22 1352  Chief Complaint(s) Eye Pain (right), Dizziness, and Near Syncope  HPI Toni Mcmillan is a 88 y.o. female with history of A-fib on Eliquis, diabetes, presenting to the emergency department with near syncope.  The patient was standing in the kitchen when she says she suddenly felt lightheadedness, felt like she was going to pass out, she was able to sit down, did not fall.  Denies any ongoing symptoms.  She denies having any chest pain, palpitations during this episode.  No trauma.  She reports some right eye pain which she has had for a month, has already seen an ophthalmologist for this and was told that her eye exam was normal.  She also reports she might of had some right arm pain during this episode which is currently resolved.  No headaches.  No abdominal pain, nausea, vomiting, diarrhea.  Reports other episodes in the past, wore cardiac monitor which was apparently abnormal but has not yet followed up with the cardiologist.  Past Medical History Past Medical History:  Diagnosis Date   A-fib (HCC)    Allergic rhinitis, cause unspecified    Diabetes mellitus without complication (HCC)    borderline"never on meds"   Disturbances of sensation of smell and taste    Dizziness    Headache(784.0)    has decreased to none in later years   Insomnia, unspecified    Other and unspecified hyperlipidemia    Personal history of other diseases of digestive system    Scarlet fever    Unspecified essential hypertension    Metoprolol decreased due to drop in BP readings.   Unspecified sinusitis (chronic)    Whooping cough, unspecified organism    Patient Active Problem List   Diagnosis Date Noted   Near syncope 08/16/2022   Hyperlipidemia 05/25/2022   Osteoporosis 05/25/2022   Sleep pattern disturbance 05/25/2022   Falls frequently 03/25/2022   Ankle pain, right 03/25/2022   Cerumen  impaction 03/25/2022   Coronary artery calcification seen on CT scan 03/10/2022   PAF (paroxysmal atrial fibrillation) (HCC) 03/08/2022   Secondary hypercoagulable state (HCC) 03/08/2022   Nail disorder 02/12/2022   Insect bite of abdomen, infected 02/12/2022   Grief 01/06/2022   Memory change 02/10/2021   Sinus bradycardia 01/29/2021   Peripheral neuropathy 09/11/2020   Arthritis 04/01/2020   Chronic left-sided thoracic back pain 12/11/2019   RLQ abdominal pain 05/29/2018   GERD (gastroesophageal reflux disease) 01/11/2018   Dry eyes 11/24/2017   Toe pain 08/16/2017   Allergic conjunctivitis, bilateral 03/24/2017   Pseudophakia of both eyes 03/24/2017   Foot pain, bilateral 01/10/2017   Acute cystitis 11/27/2016   Bilateral impacted cerumen 08/16/2016   Postural vertigo 08/16/2016   Dyspnea on exertion 05/07/2016   Palpitations 05/07/2016   Dizziness 03/30/2016   Vitreomacular traction syndrome of both eyes 03/25/2016   Constipation 11/19/2015   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 (HCC) 09/11/2015   Paresthesia of both feet 06/04/2015   Abdominal bloating 04/23/2015   Stool incontinence 04/23/2015   Blood in stool 04/23/2015   Well adult exam 07/11/2014   Syncope and collapse 05/09/2014   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/15/2013   Allergic conjunctivitis 03/22/2013   Vitreomacular adhesion of both eyes 03/22/2013   Status post intraocular lens implant 06/22/2012   Diabetic macular edema of left eye (HCC) 09/23/2011   Nonproliferative diabetic retinopathy (HCC) 09/23/2011   Type  2 diabetes, controlled, with neuropathy (HCC) 12/16/2009   Dyslipidemia 10/30/2007   Essential hypertension 10/30/2007   SINUSITIS, CHRONIC 10/30/2007   ALLERGIC RHINITIS 10/30/2007   Insomnia 10/30/2007   ANOSMIA 10/30/2007   DYSPEPSIA, HX OF 10/30/2007   CATARACT EXTRACTIONS, BILATERAL, HX OF 10/30/2007   Home Medication(s) Prior to  Admission medications   Medication Sig Start Date End Date Taking? Authorizing Provider  apixaban (ELIQUIS) 2.5 MG TABS tablet Take 1 tablet (2.5 mg total) by mouth 2 (two) times daily. 03/08/22   Fenton, Clint R, PA  aspirin-acetaminophen-caffeine (EXCEDRIN MIGRAINE) [phone] MG tablet Take 2 tablets by mouth as needed for headache. Pt takes 250-65 mg as needed.    [provider]  Cholecalciferol (VITAMIN D-3) 25 MCG (1000 UT) CAPS Take 1,000 Units by mouth daily.    [provider]  donepezil (ARICEPT) 10 MG tablet Take 1 tablet (10 mg total) by mouth at bedtime. 01/06/22 01/06/23  Plotnikov, Georgina Quint, MD  Multiple Vitamins-Minerals (ONE-A-DAY WOMENS 50+) TABS Take 1 tablet by mouth daily with breakfast.    [provider]  Omega-3 Fatty Acids (FISH OIL PO) Take 1,000 mg by mouth every morning.    [provider]  pantoprazole (PROTONIX) 40 MG tablet Take 1 tablet (40 mg total) by mouth daily. 12/11/19   Plotnikov, Georgina Quint, MD  Polyvinyl Alcohol-Povidone (REFRESH OP) Place 1-2 drops into both eyes daily as needed (dry eyes).    [provider]  ramipril (ALTACE) 2.5 MG capsule TAKE ONE CAPSULE BY MOUTH DAILY 01/15/22   Plotnikov, Georgina Quint, MD  senna-docusate (SENOKOT-S) 8.6-50 MG tablet Take 2 tablets by mouth 2 (two) times daily. 11/25/21 11/25/22  Plotnikov, Georgina Quint, MD  simvastatin (ZOCOR) 40 MG tablet Take 1 tablet (40 mg total) by mouth daily at 6 PM. 04/02/22   Plotnikov, Georgina Quint, MD  vitamin C (ASCORBIC ACID) 500 MG tablet Take 500 mg by mouth daily.    [provider]  Wheat Dextrin (BENEFIBER PO) Take by mouth See admin instructions. Mix 1 teaspoonful of powder into 4-8 ounces of water and drink once a day as needed for mild constipation    [provider]                                                                                                                                    Past Surgical History Past Surgical  History:  Procedure Laterality Date   ABDOMINAL HYSTERECTOMY     ANAL RECTAL MANOMETRY N/A 07/07/2015   Procedure: ANO RECTAL MANOMETRY;  Surgeon: Charolett Bumpers, MD;  Location: [facility]COPY;  Service: Endoscopy;  Laterality: N/A;   BILATERAL OOPHORECTOMY     CATARACT EXTRACTION, BILATERAL     COLONOSCOPY W/ POLYPECTOMY     COLONOSCOPY WITH PROPOFOL N/A 05/29/2015   Procedure: COLONOSCOPY WITH PROPOFOL;  Surgeon: Charolett Bumpers, MD;  Location: [facility] ENDOSCOPY;  Service: Endoscopy;  Laterality: N/A;  correction of hammertoe deformity     CYSTOSCOPY     w/ stenting of both ureters   HEMORRHOID SURGERY     hydrosalpinx diagnostic laparoscopy     TONSILLECTOMY  age 89   vericose vein surgery     Family History Family History  Problem Relation Age of Onset   Cancer Mother        ?   Diabetes Mother    Other Mother        coagulopathy/ ruptured viscus   Hypertension Father    Hyperlipidemia Father        ?   Other Father        CVA   Cancer Paternal Grandmother        breast    Social History Social History   Tobacco Use   Smoking status: Never   Smokeless tobacco: Never   Tobacco comments:    Never smoke 03/08/22  Substance Use Topics   Alcohol use: No    Alcohol/week: 0.0 standard drinks of alcohol   Drug use: No   Allergies Celecoxib, Ciprofloxacin, Cleocin [clindamycin], Clindamycin hcl, Erythromycin, Gabapentin, Meperidine, Meperidine hcl, Morphine, Morphine sulfate, Tetracycline, Triprolidine-pseudoephedrine, Zolpidem, and Latex  Review of Systems Review of Systems  All other systems reviewed and are negative.   Physical Exam Vital Signs  I have reviewed the triage vital signs BP (!) 143/82   Pulse (!) 57   Temp 98.2 F (36.8 C) (Oral)   Resp (!) 21   Ht 5\' 2"  (1.575 m)   Wt 48.1 kg   SpO2 100%   BMI 19.39 kg/m  Physical Exam Vitals and nursing note reviewed.  Constitutional:      General: She is not in acute distress.    Appearance: She is  well-developed.  HENT:     Head: Normocephalic and atraumatic.     Mouth/Throat:     Mouth: Mucous membranes are moist.  Eyes:     Pupils: Pupils are equal, round, and reactive to light.  Cardiovascular:     Rate and Rhythm: Normal rate and regular rhythm.     Heart sounds: No murmur heard. Pulmonary:     Effort: Pulmonary effort is normal. No respiratory distress.     Breath sounds: Normal breath sounds.  Abdominal:     General: Abdomen is flat.     Palpations: Abdomen is soft.     Tenderness: There is no abdominal tenderness.  Musculoskeletal:        General: No tenderness.     Right lower leg: No edema.     Left lower leg: No edema.  Skin:    General: Skin is warm and dry.  Neurological:     General: No focal deficit present.     Mental Status: She is alert. Mental status is at baseline.     Comments: Cranial nerves II through XII intact, strength 5 out of 5 in the bilateral upper and lower extremities, no sensory deficit to light touch, no dysmetria on finger-nose-finger testing,   Psychiatric:        Mood and Affect: Mood normal.        Behavior: Behavior normal.     ED Results and Treatments Labs (all labs ordered are listed, but only abnormal results are displayed) Labs Reviewed  CBC WITH DIFFERENTIAL/PLATELET - Abnormal; Notable for the following components:      Result Value   RBC 3.68 (*)    Hemoglobin 10.9 (*)    HCT 34.2 (*)  All other components within normal limits  BASIC METABOLIC PANEL - Abnormal; Notable for the following components:   Glucose, Bld 130 (*)    All other components within normal limits  TROPONIN I (HIGH SENSITIVITY)  TROPONIN I (HIGH SENSITIVITY)                                                                                                                          Radiology CT Head Wo Contrast  Result Date: 08/16/2022 CLINICAL DATA:  Sudden onset severe headache EXAM: CT HEAD WITHOUT CONTRAST TECHNIQUE: Contiguous axial images  were obtained from the base of the skull through the vertex without intravenous contrast. RADIATION DOSE REDUCTION: This exam was performed according to the departmental dose-optimization program which includes automated exposure control, adjustment of the mA and/or kV according to patient size and/or use of iterative reconstruction technique. COMPARISON:  Head CT 07/03/2022 FINDINGS: Brain: There is no acute intracranial hemorrhage, extra-axial fluid collection, or acute infarct Parenchymal volume is normal. The ventricles are normal in size. Gray-white differentiation is preserved. There is no mass lesion.  There is no mass effect or midline shift. Vascular: There is calcification of the bilateral carotid siphons. Skull: Normal. Negative for fracture or focal lesion. Sinuses/Orbits: The imaged paranasal sinuses are clear. Bilateral lens implants are in place. The globes and orbits are otherwise unremarkable. Other: None. IMPRESSION: Stable noncontrast head CT with no acute intracranial pathology. Electronically Signed   By: Lesia Hausen M.D.   On: 08/16/2022 15:25    Pertinent labs & imaging results that were available during my care of the patient were reviewed by me and considered in my medical decision making (see MDM for details).  Medications Ordered in ED Medications  fluorescein ophthalmic strip 1 strip (1 strip Both Eyes Given by Other 08/16/22 1636)  tetracaine (PONTOCAINE) 0.5 % ophthalmic solution 1 drop (1 drop Both Eyes Given by Other 08/16/22 1702)                                                                                                                                     Procedures Procedures  (including critical care time)  Medical Decision Making / ED Course   MDM:  88 year old female presenting to the emergency department near syncopal episode.  Patient well-appearing, vital signs in the emergency department reassuring.  Exam reassuring including neurologic exam.  EKG  appears similar to  prior.  Viewed prior records including CV monitor which demonstrated episodes of tachycardia to 200 and bradycardia.  Although patient did not lose consciousness, some concern that patient symptoms were due to cardiac cause given previous heart monitor results.  Likely discussed with cardiology, check troponin.  No chest pain to suggest ACS.  Doubt neurologic cause of near syncope, patient did not have any numbness, tingling or weakness, normal neurologic exam, no headaches.  Did not fall, denies history of trauma or hitting head.  No external signs of trauma.  Will reassess.  Patient also complaining of right eye pain, seems chronic, reports it is not really pain but really a scratchy sensation.  Has already seen ophthalmologist without dangerous diagnosis.  Clinical Course as of 08/16/22 2005  Mon Aug 16, 2022  1724 IOP 15 L 16 R. No fluorescein uptake on exam. Suspect likely dry eyes  [WS]  1817 Dr. Mayford Knife recommends admission and cardiology will consult.  [WS]  1840 Discussed with Dr. Allena Katz of hospitalist service who will accept patient for admit.  [WS]  2000 Informed patient now wants to leave AGAINST MEDICAL ADVICE.  Discussed with patient and her son at bedside.  The patient is alert and oriented x3.  Patient's son is at bedside.  The patient and the son are in agreement that they want the patient to go home.  Patient and the son demonstrate understanding of the reasons we recommend admission including further cardiac monitoring, medication adjustment.  They demonstrate understanding of the risks of leaving AGAINST MEDICAL ADVICE including recurrent episode of near syncope, loss of consciousness, further injury, permanent disability or even death from a fall, or malignant cardiac arrhythmia leading to death, as well as other undiagnosed conditions which may be revealed by an inpatient hospitalization.  They understand the patient may return at any time for repeat evaluation.   Patient does have follow-up with her cardiologist but not till October 20.  Advised calling for expedited follow-up, hopefully within the next few days given abnormal heart monitoring at home. [WS]    Clinical Course User Index [WS] Lonell Grandchild, MD     Additional history obtained: -Additional history obtained from family -External records from outside source obtained and reviewed including: Chart review including previous notes, labs, imaging, consultation notes   Lab Tests: -I ordered, reviewed, and interpreted labs.   The pertinent results include:   Labs Reviewed  CBC WITH DIFFERENTIAL/PLATELET - Abnormal; Notable for the following components:      Result Value   RBC 3.68 (*)    Hemoglobin 10.9 (*)    HCT 34.2 (*)    All other components within normal limits  BASIC METABOLIC PANEL - Abnormal; Notable for the following components:   Glucose, Bld 130 (*)    All other components within normal limits  TROPONIN I (HIGH SENSITIVITY)  TROPONIN I (HIGH SENSITIVITY)      EKG   EKG Interpretation  Date/Time:  Monday August 16 2022 14:30:43 EDT Ventricular Rate:  84 PR Interval:  174 QRS Duration: 80 QT Interval:  350 QTC Calculation: 413 R Axis:   -9 Text Interpretation: Normal sinus rhythm with sinus arrhythmia Abnormal QRS-T angle, consider primary T wave abnormality Abnormal ECG When compared with ECG of 03-Jul-2022 16:20, PREVIOUS ECG IS PRESENT Confirmed by Alvino Blood ([id number]) on 08/16/2022 4:12:21 PM         Imaging Studies ordered: I ordered imaging studies including CT brain, CXR On my interpretation imaging demonstrates no acute process I  independently visualized and interpreted imaging. I agree with the radiologist interpretation   Medicines ordered and prescription drug management: Meds ordered this encounter  Medications   fluorescein ophthalmic strip 1 strip   tetracaine (PONTOCAINE) 0.5 % ophthalmic solution 1 drop    -I have reviewed  the patients home medicines and have made adjustments as needed   Consultations Obtained: I requested consultation with the cardiologist,  and discussed lab and imaging findings as well as pertinent plan - they recommend: admission for syncope workup   Cardiac Monitoring: The patient was maintained on a cardiac monitor.  I personally viewed and interpreted the cardiac monitored which showed an underlying rhythm of: sinus bradycardia    Reevaluation: After the interventions noted above, I reevaluated the patient and found that they have stayed the same  Co morbidities that complicate the patient evaluation  Past Medical History:  Diagnosis Date   A-fib ([place])    Allergic rhinitis, cause unspecified    Diabetes mellitus without complication ([place])    borderline"never on meds"   Disturbances of sensation of smell and taste    Dizziness    Headache(784.0)    has decreased to none in later years   Insomnia, unspecified    Other and unspecified hyperlipidemia    Personal history of other diseases of digestive system    Scarlet fever    Unspecified essential hypertension    Metoprolol decreased due to drop in BP readings.   Unspecified sinusitis (chronic)    Whooping cough, unspecified organism       Dispostion: Left against medical advice    Final Clinical Impression(s) / ED Diagnoses Final diagnoses:  Near syncope     This chart was dictated using voice recognition software.  Despite best efforts to proofread,  errors can occur which can change the documentation meaning.    Cristie Hem, MD 08/16/22 2005

## 2022-08-16 NOTE — Telephone Encounter (Signed)
Spoke with daughter. See phone note.

## 2022-08-16 NOTE — Discharge Instructions (Signed)
We evaluated you for your near fainting episode.  We also evaluated you for your eye pain.  We recommended staying in the hospital for further evaluation of your near fainting episode.  The cardiologist also preferred that you stayed in the hospital for further evaluation.  You decided that you did not want to stay in the hospital and understand the risks which include injury, disability, or even death.  You are welcome to return to the emergency department at any time.  Please follow-up closely with your cardiologist.  Please call to schedule a sooner appointment than October 20.  Return if you develop any recurrent episodes of fainting, fall or hit your head, or any other concerning symptoms.

## 2022-08-16 NOTE — ED Notes (Signed)
Discharge paperwork given and verbally understood. 

## 2022-08-16 NOTE — ED Triage Notes (Signed)
Pt to ED c/o near syncope episode that occurred today when standing and washing dishes. Pt did not fall. Pt also c/o right eye pain and right arm pain that are chronic in nature, ongoing for past month. Pt c/o feeling dizzy.

## 2022-08-16 NOTE — Telephone Encounter (Signed)
Pt's daughter returning call. Transferred to Judson Roch, Therapist, sports.

## 2022-08-16 NOTE — Telephone Encounter (Signed)
Pt c/o of Chest Pain: STAT if CP now or developed within 24 hours  1. Are you having CP right now? No  2. Are you experiencing any other symptoms (ex. SOB, nausea, vomiting, sweating)? Not sure   3. How long have you been experiencing CP? Pt's daughter says she has been having "spells" like this off and on for the past few months.   4. Is your CP continuous or coming and going? Coming and going   5. Have you taken Nitroglycerin? No  Pt's daughter states that patient is having pains in right side going into the face and would like to get the patient seen asap and hopefully today.  ?

## 2022-08-16 NOTE — Telephone Encounter (Signed)
Daughter returned call. She state that this AM patient had chest pain, rt arm and rt eye pain. She was taken to [facility] ED. Daughter requested the appointment for tomorrow with R. Dunn be cancelled. (Done).

## 2022-08-16 NOTE — Telephone Encounter (Signed)
LMTCB on Toni Mcmillan's number and patient's number.

## 2022-08-17 ENCOUNTER — Ambulatory Visit: Payer: Medicare Other | Admitting: Physician Assistant

## 2022-08-17 ENCOUNTER — Telehealth: Payer: Self-pay | Admitting: *Deleted

## 2022-08-17 NOTE — Telephone Encounter (Signed)
Transition Care Management Follow-up Telephone Call Date of discharge and from where: 08/25/22 How have you been since you were released from the hospital? Pt states she is fine. She has hosp w/ heart doctor 08/18/22 Any questions or concerns? No  Items Reviewed: Did the pt receive and understand the discharge instructions provided? Yes  Medications obtained and verified? Yes  Other? No  Any new allergies since your discharge? No  Dietary orders reviewed? Yes Do you have support at home? Yes  she states between her kids they take care of her  [place] and Equipment/Supplies: Were home health services ordered? no If so, what is the name of the agency?   Has the agency set up a time to come to the patient's home? not applicable Were any new equipment or medical supplies ordered?  No What is the name of the medical supply agency?  Were you able to get the supplies/equipment? not applicable Do you have any questions related to the use of the equipment or supplies? No  Functional Questionnaire: (I = Independent and D = Dependent) ADLs:   Bathing/Dressing- yes  Meal Prep- no  Eating- yes  Maintaining continence- yes  Transferring/Ambulation- no  Managing Meds- yes  Follow up appointments reviewed:  PCP Hospital f/u appt confirmed? No  Scheduled to see Dr. Gwenlyn Found for hops f/u 08/18/22. [facility] f/u appt confirmed? No   Are transportation arrangements needed? Yes  she states her daughter will bring her to appt If their condition worsens, is the pt aware to call PCP or go to the Emergency Dept.? No Was the patient provided with contact information for the PCP's office or ED? No Was to pt encouraged to call back with questions or concerns? No

## 2022-08-18 ENCOUNTER — Encounter: Payer: Self-pay | Admitting: Cardiovascular Disease

## 2022-08-18 ENCOUNTER — Ambulatory Visit: Payer: Medicare Other | Attending: Cardiovascular Disease | Admitting: Cardiovascular Disease

## 2022-08-18 VITALS — BP 134/72 | HR 68 | Ht 62.0 in | Wt 104.0 lb

## 2022-08-18 DIAGNOSIS — R55 Syncope and collapse: Secondary | ICD-10-CM | POA: Diagnosis not present

## 2022-08-18 DIAGNOSIS — I480 Paroxysmal atrial fibrillation: Secondary | ICD-10-CM | POA: Diagnosis not present

## 2022-08-18 DIAGNOSIS — I10 Essential (primary) hypertension: Secondary | ICD-10-CM | POA: Diagnosis not present

## 2022-08-18 NOTE — Assessment & Plan Note (Signed)
History of witnessed syncope and collapse.  She had an episode 03/04/2022 and a second episode 07/03/2022.  She did see Nicholes Rough PA-C who ordered an event monitor that showed A-fib with fast and slow ventricular response as well as any episodes of SVT with heart rates up in the 200 range.  I am referring her to EP for further evaluation and treatment.  She is on Eliquis oral anticoagulation.

## 2022-08-18 NOTE — Assessment & Plan Note (Signed)
History of essential hypertension a blood pressure measured today at 134/72.  She is on metoprolol and ramipril.

## 2022-08-18 NOTE — Progress Notes (Signed)
08/18/2022 Toni Mcmillan   01/30/1931  [id number]  Primary Physician Plotnikov, Evie Lacks, MD Primary Cardiologist: Lorretta Harp MD FACP, [place], [place], [place]  HPI:  MAGHEN GROUP is a 88 y.o.  thin-appearing recently widowed (husband Pilar Plate of 11 years passed away recently) Caucasian female mother of 63, grandmother 7 grandchildren who I last saw in the office 6 years ago.  She is accompanied by one of her daughters Jeanett Schlein today.  She was seen for dizziness 6 years ago. She has a history of treated hypertension and hyperlipidemia. She has never had a heart attack or stroke. She does complain of some dyspnea on exertion. She's had some recent dizziness while standing in the kitchen with tachycardia palpitations when she feels her pulse.She denies chest pain.  I did obtain an event monitor that showed only PVCs at that time.  2D echo was normal.  She presented to the emergency room with an episode of syncope on 03/04/2022.  And EMS she was documented to have A-fib with a variable ventricular sponsor from 110 to 160 bpm but converted to sinus rhythm when she arrived.  She did have a chest CT that showed no pulmonary embolism but did show severe coronary calcification.  She continues to deny chest pain.  She was seen by Malka So, PA-C in the A-fib clinic on 03/08/2022 and was placed on low-dose Eliquis oral anticoagulation.  She currently lives alone, does not drive but has family close by.  Since I saw her 6 months ago she had a recurrent episode of syncope that was caught on videotape 07/03/2022.  She saw Nicholes Rough PA-C in the office who ordered an event monitor that showed tachybradycardia syndrome with A-fib with both fast and slow ventricular response as well as SVT with heart rates up in the 200 range.  A 2D echo performed 07/08/2022 was essentially normal with grade 1 diastolic dysfunction and mild to moderate TR.  She denies chest pain or shortness of breath.  Current Meds   Medication Sig   apixaban (ELIQUIS) 2.5 MG TABS tablet Take 1 tablet (2.5 mg total) by mouth 2 (two) times daily.   aspirin-acetaminophen-caffeine (EXCEDRIN MIGRAINE) 250-250-65 MG tablet Take 2 tablets by mouth as needed for headache. Pt takes 250-65 mg as needed.   Cholecalciferol (VITAMIN D-3) 25 MCG (1000 UT) CAPS Take 1,000 Units by mouth daily.   donepezil (ARICEPT) 10 MG tablet Take 1 tablet (10 mg total) by mouth at bedtime.   metoprolol tartrate (LOPRESSOR) 50 MG tablet TAKE 1 AND 1/2 TABLET BY MOUTH TWO TIMES A DAY   Multiple Vitamins-Minerals (ONE-A-DAY WOMENS 50+) TABS Take 1 tablet by mouth daily with breakfast.   Omega-3 Fatty Acids (FISH OIL PO) Take 1,000 mg by mouth every morning.   pantoprazole (PROTONIX) 40 MG tablet Take 1 tablet (40 mg total) by mouth daily.   Polyvinyl Alcohol-Povidone (REFRESH OP) Place 1-2 drops into both eyes daily as needed (dry eyes).   ramipril (ALTACE) 2.5 MG capsule TAKE ONE CAPSULE BY MOUTH DAILY   senna-docusate (SENOKOT-S) 8.6-50 MG tablet Take 2 tablets by mouth 2 (two) times daily.   simvastatin (ZOCOR) 40 MG tablet Take 1 tablet (40 mg total) by mouth daily at 6 PM.   vitamin C (ASCORBIC ACID) 500 MG tablet Take 500 mg by mouth daily.   Wheat Dextrin (BENEFIBER PO) Take by mouth See admin instructions. Mix 1 teaspoonful of powder into 4-8 ounces of water and drink once a day  as needed for mild constipation     Allergies  Allergen Reactions   Celecoxib Other (See Comments)    Reaction not recalled    Ciprofloxacin Swelling and Other (See Comments)    Caused wrist swelling   Cleocin [Clindamycin] Itching   Clindamycin Hcl Itching   Erythromycin Other (See Comments)    GI upset   Gabapentin Other (See Comments)    Made the patient feel not well the next day   Meperidine Itching   Meperidine Hcl Itching   Morphine Itching   Morphine Sulfate Itching   Tetracycline Other (See Comments)    GI upset   Triprolidine-Pseudoephedrine Other  (See Comments)    Reaction not recalled   Zolpidem Other (See Comments)    "Did not have a good sleep"   Latex Itching and Rash    Social History   Socioeconomic History   Marital status: Married    Spouse name: Not on file   Number of children: Not on file   Years of education: Not on file   Highest education level: Not on file  Occupational History   Not on file  Tobacco Use   Smoking status: Never   Smokeless tobacco: Never   Tobacco comments:    Never smoke 03/08/22  Substance and Sexual Activity   Alcohol use: No    Alcohol/week: 0.0 standard drinks of alcohol   Drug use: No   Sexual activity: Not on file  Other Topics Concern   Not on file  Social History Narrative   7 grand children, 4 great-grandchildren   Mostly homemaker, 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, 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   Death of step- grandson by suicide- 9-10   Social Determinants of Health   Financial Resource Strain: Low Risk  (07/08/2020)   Overall Financial Resource Strain (CARDIA)    Difficulty of Paying Living Expenses: Not hard at all  Food Insecurity: No Food Insecurity (07/08/2020)   Hunger Vital Sign    Worried About Running Out of Food in the Last Year: Never true    Ran Out of Food in the Last Year: Never true  Transportation Needs: No Transportation Needs (07/08/2020)   PRAPARE - Administrator, Civil Service (Medical): No    Lack of Transportation (Non-Medical): No  Physical Activity: Sufficiently Active (07/08/2020)   Exercise Vital Sign    Days of Exercise per Week: 5 days    Minutes of Exercise per Session: 30 min  Stress: No Stress Concern Present (07/08/2020)   [employer] of Occupational Health - Occupational Stress Questionnaire    Feeling of Stress : Not at all  Social Connections: Not on file  Intimate Partner Violence: Not At Risk (07/08/2020)   Humiliation, Afraid, Rape, and Kick questionnaire    Fear of Current or Ex-Partner: No    Emotionally Abused:  No    Physically Abused: No    Sexually Abused: No     Review of Systems: General: negative for chills, fever, night sweats or weight changes.  Cardiovascular: negative for chest pain, dyspnea on exertion, edema, orthopnea, palpitations, paroxysmal nocturnal dyspnea or shortness of breath Dermatological: negative for rash Respiratory: negative for cough or wheezing Urologic: negative for hematuria Abdominal: negative for nausea, vomiting, diarrhea, bright red blood per rectum, melena, or hematemesis Neurologic: negative for visual changes, syncope, or dizziness All other systems reviewed and are otherwise negative except as noted above.    Blood pressure 134/72, pulse 68, height 5\' 2"  (1.575  m), weight 104 lb (47.2 kg).  General appearance: alert and no distress Neck: no adenopathy, no carotid bruit, no JVD, supple, symmetrical, trachea midline, and thyroid not enlarged, symmetric, no tenderness/mass/nodules Lungs: clear to auscultation bilaterally Heart: regular rate and rhythm, S1, S2 normal, no murmur, click, rub or gallop Extremities: extremities normal, atraumatic, no cyanosis or edema Pulses: 2+ and symmetric Skin: Skin color, texture, turgor normal. No rashes or lesions Neurologic: Grossly normal  EKG not performed today  ASSESSMENT AND PLAN:   Essential hypertension History of essential hypertension a blood pressure measured today at 134/72.  She is on metoprolol and ramipril.  Syncope and collapse History of witnessed syncope and collapse.  She had an episode 03/04/2022 and a second episode 07/03/2022.  She did see Jari Favre PA-C who ordered an event monitor that showed A-fib with fast and slow ventricular response as well as any episodes of SVT with heart rates up in the 200 range.  I am referring her to EP for further evaluation and treatment.  She is on Eliquis oral anticoagulation.     Runell Gess MD FACP,FACC,FAHA, [facility] 08/18/2022 3:43 PM

## 2022-08-18 NOTE — Patient Instructions (Signed)
Medication Instructions:  Your physician recommends that you continue on your current medications as directed. Please refer to the Current Medication list given to you today.  *If you need a refill on your cardiac medications before your next appointment, please call your pharmacy*   Follow-Up: At [facility], you and your health needs are our priority.  As part of our continuing mission to provide you with exceptional heart care, we have created designated Provider Care Teams.  These Care Teams include your primary Cardiologist (physician) and Advanced Practice Providers (APPs -  Physician Assistants and Nurse Practitioners) who all work together to provide you with the care you need, when you need it.  We recommend signing up for the patient portal called "MyChart".  Sign up information is provided on this After Visit Summary.  MyChart is used to connect with patients for Virtual Visits (Telemedicine).  Patients are able to view lab/test results, encounter notes, upcoming appointments, etc.  Non-urgent messages can be sent to your provider as well.   To learn more about what you can do with MyChart, go to [email or web address].    Your next appointment:   6 month(s)  The format for your next appointment:   In Person  Provider:   Nicholes Rough, PA-C        Then, Quay Burow, MD will plan to see you again in 12 month(s).

## 2022-08-25 ENCOUNTER — Ambulatory Visit
Admission: RE | Admit: 2022-08-25 | Discharge: 2022-08-25 | Disposition: A | Discharge status: Home / Self Care | Payer: Medicare Other | Source: Ambulatory Visit | Attending: Internal Medicine | Admitting: Internal Medicine

## 2022-08-25 ENCOUNTER — Ambulatory Visit (INDEPENDENT_AMBULATORY_CARE_PROVIDER_SITE_OTHER): Payer: Medicare Other

## 2022-08-25 ENCOUNTER — Encounter: Payer: Self-pay | Admitting: Internal Medicine

## 2022-08-25 ENCOUNTER — Ambulatory Visit: Payer: Medicare Other | Admitting: Internal Medicine

## 2022-08-25 VITALS — BP 122/68 | HR 63 | Ht 62.0 in | Wt 103.4 lb

## 2022-08-25 DIAGNOSIS — R413 Other amnesia: Secondary | ICD-10-CM

## 2022-08-25 DIAGNOSIS — R55 Syncope and collapse: Secondary | ICD-10-CM

## 2022-08-25 DIAGNOSIS — E1140 Type 2 diabetes mellitus with diabetic neuropathy, unspecified: Secondary | ICD-10-CM

## 2022-08-25 DIAGNOSIS — D649 Anemia, unspecified: Secondary | ICD-10-CM

## 2022-08-25 DIAGNOSIS — S0003XA Contusion of scalp, initial encounter: Secondary | ICD-10-CM

## 2022-08-25 DIAGNOSIS — Z0000 Encounter for general adult medical examination without abnormal findings: Secondary | ICD-10-CM

## 2022-08-25 DIAGNOSIS — R296 Repeated falls: Secondary | ICD-10-CM

## 2022-08-25 MED ORDER — DONEPEZIL HCL 5 MG PO TABS: 5.0000 mg | ORAL_TABLET | Freq: Every day | ORAL | 3 refills | Status: DC

## 2022-08-25 NOTE — Assessment & Plan Note (Signed)
Head CT today

## 2022-08-25 NOTE — Assessment & Plan Note (Signed)
Pt lives at home

## 2022-08-25 NOTE — Progress Notes (Addendum)
Subjective:   Toni Mcmillan is a 88 y.o. female who presents for Medicare Annual (Subsequent) preventive examination.  Review of Systems     Cardiac Risk Factors include: advanced age (>46men, >84 women);family history of premature cardiovascular disease;hypertension;dyslipidemia     Objective:    Today's Vitals   08/25/22 1617  BP: 122/68  Pulse: 63  SpO2: 97%  Weight: 103 lb 6.3 oz (46.9 kg)  Height: 5\' 2"  (1.575 m)  PainSc: 0-No pain   Body mass index is 18.91 kg/m.     08/25/2022    4:20 PM 07/03/2022    3:00 PM 01/29/2021   10:11 PM 07/08/2020    1:54 PM 05/29/2015   10:41 AM  Advanced Directives  Does Patient Have a Medical Advance Directive? Yes Yes No Yes Yes  Type of 05/31/2015 of [place];Living will Living will  Living will;Healthcare Power of [place] Power of [place];Living will  Does patient want to make changes to medical advance directive?    No - Patient declined   Copy of Healthcare Power of Attorney in Chart? No - copy requested   No - copy requested No - copy requested  Would patient like information on creating a medical advance directive?   No - Patient declined      Current Medications (verified) Outpatient Encounter Medications as of 08/25/2022  Medication Sig   apixaban (ELIQUIS) 2.5 MG TABS tablet Take 1 tablet (2.5 mg total) by mouth 2 (two) times daily.   aspirin-acetaminophen-caffeine (EXCEDRIN MIGRAINE) 250-250-65 MG tablet Take 2 tablets by mouth as needed for headache. Pt takes 250-65 mg as needed.   Cholecalciferol (VITAMIN D-3) 25 MCG (1000 UT) CAPS Take 1,000 Units by mouth daily.   donepezil (ARICEPT) 5 MG tablet Take 1 tablet (5 mg total) by mouth at bedtime.   Multiple Vitamins-Minerals (ONE-A-DAY WOMENS 50+) TABS Take 1 tablet by mouth daily with breakfast.   Omega-3 Fatty Acids (FISH OIL PO) Take 1,000 mg by mouth every morning.   pantoprazole (PROTONIX) 40 MG tablet Take 1 tablet (40 mg  total) by mouth daily.   Polyvinyl Alcohol-Povidone (REFRESH OP) Place 1-2 drops into both eyes daily as needed (dry eyes).   senna-docusate (SENOKOT-S) 8.6-50 MG tablet Take 2 tablets by mouth 2 (two) times daily.   simvastatin (ZOCOR) 40 MG tablet Take 1 tablet (40 mg total) by mouth daily at 6 PM.   vitamin C (ASCORBIC ACID) 500 MG tablet Take 500 mg by mouth daily.   Wheat Dextrin (BENEFIBER PO) Take by mouth See admin instructions. Mix 1 teaspoonful of powder into 4-8 ounces of water and drink once a day as needed for mild constipation   No facility-administered encounter medications on file as of 08/25/2022.    Allergies (verified) Celecoxib, Ciprofloxacin, Cleocin [clindamycin], Clindamycin hcl, Erythromycin, Gabapentin, Meperidine, Meperidine hcl, Morphine, Morphine sulfate, Tetracycline, Triprolidine-pseudoephedrine, Zolpidem, and Latex   History: Past Medical History:  Diagnosis Date   A-fib (HCC)    Allergic rhinitis, cause unspecified    Diabetes mellitus without complication (HCC)    borderline"never on meds"   Disturbances of sensation of smell and taste    Dizziness    Headache(784.0)    has decreased to none in later years   Insomnia, unspecified    Other and unspecified hyperlipidemia    Personal history of other diseases of digestive system    Scarlet fever    Unspecified essential hypertension    Metoprolol decreased due to drop in BP  readings.   Unspecified sinusitis (chronic)    Whooping cough, unspecified organism    Past Surgical History:  Procedure Laterality Date   ABDOMINAL HYSTERECTOMY     ANAL RECTAL MANOMETRY N/A 07/07/2015   Procedure: ANO RECTAL MANOMETRY;  Surgeon: Charolett BumpersMartin K Johnson, MD;  Location: [facility];  Service: Endoscopy;  Laterality: N/A;   BILATERAL OOPHORECTOMY     CATARACT EXTRACTION, BILATERAL     COLONOSCOPY W/ POLYPECTOMY     COLONOSCOPY WITH PROPOFOL N/A 05/29/2015   Procedure: COLONOSCOPY WITH PROPOFOL;  Surgeon: Charolett BumpersMartin K  Johnson, MD;  Location: [facility];  Service: Endoscopy;  Laterality: N/A;   correction of hammertoe deformity     CYSTOSCOPY     w/ stenting of both ureters   HEMORRHOID SURGERY     hydrosalpinx diagnostic laparoscopy     TONSILLECTOMY  age 88   vericose vein surgery     Family History  Problem Relation Age of Onset   Cancer Mother        ?   Diabetes Mother    Other Mother        coagulopathy/ ruptured viscus   Hypertension Father    Hyperlipidemia Father        ?   Other Father        CVA   Cancer Paternal Grandmother        breast   Social History   Socioeconomic History   Marital status: Married    Spouse name: Not on file   Number of children: Not on file   Years of education: Not on file   Highest education level: Not on file  Occupational History   Not on file  Tobacco Use   Smoking status: Never   Smokeless tobacco: Never   Tobacco comments:    Never smoke 03/08/22  Substance and Sexual Activity   Alcohol use: No    Alcohol/week: 0.0 standard drinks of alcohol   Drug use: No   Sexual activity: Not on file  Other Topics Concern   Not on file  Social History Narrative   7 grand children, 4 great-grandchildren   Mostly homemaker, 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, 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   Death of step- grandson by suicide- 9-10   Social Determinants of Health   Financial Resource Strain: Low Risk  (08/25/2022)   Overall Financial Resource Strain (CARDIA)    Difficulty of Paying Living Expenses: Not hard at all  Food Insecurity: No Food Insecurity (08/25/2022)   Hunger Vital Sign    Worried About Running Out of Food in the Last Year: Never true    Ran Out of Food in the Last Year: Never true  Transportation Needs: No Transportation Needs (08/25/2022)   PRAPARE - Administrator, Civil ServiceTransportation    Lack of Transportation (Medical): No    Lack of Transportation (Non-Medical): No  Physical Activity: Sufficiently Active (08/25/2022)   Exercise Vital Sign    Days of  Exercise per Week: 5 days    Minutes of Exercise per Session: 30 min  Stress: No Stress Concern Present (08/25/2022)   [employer] - Occupational Stress Questionnaire    Feeling of Stress : Not at all  Social Connections: Moderately Integrated (08/25/2022)   Social Connection and Isolation Panel [NHANES]    Frequency of Communication with Friends and Family: More than three times a week    Frequency of Social Gatherings with Friends and Family: More than three times a week    Attends  Religious Services: More than 4 times per year    Active Member of Clubs or Organizations: Yes    Attends Archivist Meetings: More than 4 times per year    Marital Status: Widowed    Tobacco Counseling Counseling given: Not Answered Tobacco comments: Never smoke 03/08/22   Clinical Intake:  Pre-visit preparation completed: Yes  Pain : No/denies pain Pain Score: 0-No pain     BMI - recorded: 18.91 Nutritional Status: BMI <19  Underweight Nutritional Risks: None Diabetes: No CBG done?: No  How often do you need to have someone help you when you read instructions, pamphlets, or other written materials from your doctor or pharmacy?: 1 - Never What is the last grade level you completed in school?: HSG  Diabetic? Yes; Controlled with diet and exercise. No medications.  Interpreter Needed?: No  Information entered by :: Lisette Abu, LPN.   Activities of Daily Living    08/25/2022    4:21 PM  In your present state of health, do you have any difficulty performing the following activities:  Hearing? 0  Vision? 0  Difficulty concentrating or making decisions? 0  Walking or climbing stairs? 1  Dressing or bathing? 0  Doing errands, shopping? 1  Preparing Food and eating ? N  Using the Toilet? N  In the past six months, have you accidently leaked urine? Y  Do you have problems with loss of bowel control? Y  Managing your Medications? N  Managing  your Finances? N  Housekeeping or managing your Housekeeping? N    Patient Care Team: Plotnikov, Evie Lacks, MD as PCP - General (Internal Medicine) Lorretta Harp, MD as PCP - Cardiology (Cardiology) Pedro Earls, MD (Orthopedic Surgery) Arvella Nigh, MD (Obstetrics and Gynecology) Gerarda Fraction, MD as Referring Physician (Ophthalmology) Clarene Essex, MD as Consulting Physician (Gastroenterology) Lorretta Harp, MD as Consulting Physician (Cardiology)  Indicate any recent Medical Services you may have received from other than [facility] providers in the past year (date may be approximate).     Assessment:   This is a routine wellness examination for [place].  Hearing/Vision screen Hearing Screening - Comments:: Denies hearing difficulties   Vision Screening - Comments:: Wears rx glasses - up to date with routine eye exams with Dr. Gerarda Fraction   Dietary issues and exercise activities discussed: Current Exercise Habits: Home exercise routine, Type of exercise: walking, Time (Minutes): 30, Frequency (Times/Week): 5, Weekly Exercise (Minutes/Week): 150, Intensity: Moderate, Exercise limited by: orthopedic condition(s);cardiac condition(s);neurologic condition(s)   Goals Addressed             This Visit's Progress    MAINTAIN [place].        Depression Screen    08/25/2022    4:19 PM 03/29/2022    3:06 PM 03/25/2022   11:55 AM 01/06/2022    1:56 PM 08/12/2021    2:16 PM 07/08/2020    1:53 PM 11/28/2018   10:10 AM  PHQ 2/9 Scores  PHQ - 2 Score 0 0 2 0 0 0 0  PHQ- 9 Score  6 5  0      Fall Risk    08/25/2022    4:21 PM 03/29/2022    3:06 PM 03/25/2022   11:55 AM 01/06/2022    1:56 PM 08/12/2021    2:16 PM  Fall Risk   Falls in the past year? 1 1 0 0 1  Number falls in past yr: 1 1 0  1  Injury with Fall? 1 1 0  1  Risk for fall due to : History of fall(s);Impaired balance/gait;Orthopedic patient    History of fall(s);Impaired balance/gait   Follow up Falls prevention discussed;Falls evaluation completed  Falls evaluation completed      FALL RISK PREVENTION PERTAINING TO THE HOME:  Any stairs in or around the home? Yes  If so, are there any without handrails? No  Home free of loose throw rugs in walkways, pet beds, electrical cords, etc? Yes  Adequate lighting in your home to reduce risk of falls? Yes   ASSISTIVE DEVICES UTILIZED TO PREVENT FALLS:  Life alert? No  Use of a cane, walker or w/c? Yes  Grab bars in the bathroom? Yes  Shower chair or bench in shower? No  Elevated toilet seat or a handicapped toilet? No   TIMED UP AND GO:  Was the test performed? Yes .  Length of time to ambulate 10 feet: 12 sec.   Gait slow and steady with assistive device  Cognitive Function:        08/25/2022    4:23 PM 07/08/2020    1:55 PM  6CIT Screen  What Year? 0 points 0 points  What month? 0 points 0 points  What time? 0 points 0 points  Count back from 20 0 points 0 points  Months in reverse 2 points 0 points  Repeat phrase 0 points 0 points  Total Score 2 points 0 points    Immunizations Immunization History  Administered Date(s) Administered   Fluad Quad(high Dose 65+) 08/20/2019, 09/11/2020, 08/12/2021   Influenza Split 09/29/2011, 09/07/2012   Influenza Whole 09/10/2003, 09/02/2008, 08/13/2009, 08/06/2010   Influenza, High Dose Seasonal PF 08/05/2016, 08/16/2017, 09/06/2018   Influenza,inj,Quad PF,6+ Mos 08/21/2013, 08/14/2014, 07/24/2015   PFIZER(Purple Top)SARS-COV-2 Vaccination 12/08/2019, 12/29/2019   Pneumococcal Conjugate-13 07/11/2014   Pneumococcal Polysaccharide-23 09/10/2003, 07/16/2015   Td 06/08/2012   Zoster, Live 05/03/2007    TDAP status: Due, Education has been provided regarding the importance of this vaccine. Advised may receive this vaccine at local pharmacy or Health Dept. Aware to provide a copy of the vaccination record if obtained from local pharmacy or Health Dept. Verbalized  acceptance and understanding.  Flu Vaccine status: Due, Education has been provided regarding the importance of this vaccine. Advised may receive this vaccine at local pharmacy or Health Dept. Aware to provide a copy of the vaccination record if obtained from local pharmacy or Health Dept. Verbalized acceptance and understanding.  Pneumococcal vaccine status: Up to date  Covid-19 vaccine status: Completed vaccines  Qualifies for Shingles Vaccine? Yes   Zostavax completed Yes   Shingrix Completed?: No.    Education has been provided regarding the importance of this vaccine. Patient has been advised to call insurance company to determine out of pocket expense if they have not yet received this vaccine. Advised may also receive vaccine at local pharmacy or Health Dept. Verbalized acceptance and understanding.  Screening Tests Health Maintenance  Topic Date Due   FOOT EXAM  Never done   OPHTHALMOLOGY EXAM  Never done   Zoster Vaccines- Shingrix (1 of 2) Never done   DEXA SCAN  Never done   COVID-19 Vaccine (3 - Pfizer series) 02/23/2020   TETANUS/TDAP  06/08/2022   INFLUENZA VACCINE  06/15/2022   HEMOGLOBIN A1C  08/14/2022   Pneumonia Vaccine 29+ Years old  Completed   HPV VACCINES  Aged Out    Health Maintenance  Health Maintenance Due  Topic Date  Due   FOOT EXAM  Never done   OPHTHALMOLOGY EXAM  Never done   Zoster Vaccines- Shingrix (1 of 2) Never done   DEXA SCAN  Never done   COVID-19 Vaccine (3 - Pfizer series) 02/23/2020   TETANUS/TDAP  06/08/2022   INFLUENZA VACCINE  06/15/2022   HEMOGLOBIN A1C  08/14/2022    Colorectal cancer screening: No longer required.   Mammogram status: No longer required due to age.  Bone Density status: Never done  Lung Cancer Screening: (Low Dose CT Chest recommended if Age 57-80 years, 30 pack-year currently smoking OR have quit w/in 15years.) does not qualify.   Lung Cancer Screening Referral: no  Additional Screening:  Hepatitis C  Screening: does not qualify; Completed no  Vision Screening: Recommended annual ophthalmology exams for early detection of glaucoma and other disorders of the eye. Is the patient up to date with their annual eye exam?  Yes  Who is the provider or what is the name of the office in which the patient attends annual eye exams? Marvis Repress, MD. If pt is not established with a provider, would they like to be referred to a provider to establish care? No .   Dental Screening: Recommended annual dental exams for proper oral hygiene  Community Resource Referral / Chronic Care Management: CRR required this visit?  No   CCM required this visit?  No      Plan:     I have personally reviewed and noted the following in the patient's chart:   Medical and social history Use of alcohol, tobacco or illicit drugs  Current medications and supplements including opioid prescriptions. Patient is not currently taking opioid prescriptions. Functional ability and status Nutritional status Physical activity Advanced directives List of other physicians Hospitalizations, surgeries, and ER visits in previous 12 months Vitals Screenings to include cognitive, depression, and falls Referrals and appointments  In addition, I have reviewed and discussed with patient certain preventive protocols, quality metrics, and best practice recommendations. A written personalized care plan for preventive services as well as general preventive health recommendations were provided to patient.     Mickeal Needy, LPN   [id number]   Nurse Notes: N/A  Medical screening examination/treatment/procedure(s) were performed by non-physician practitioner and as supervising physician I was immediately available for consultation/collaboration.  I agree with above. Jacinta Shoe, MD

## 2022-08-25 NOTE — Assessment & Plan Note (Signed)
  Diet controlled - no medications. Continue w/diet. Monitor A1c

## 2022-08-25 NOTE — Progress Notes (Signed)
Subjective:  Patient ID: Toni Mcmillan, female    DOB: 1931/09/15  Age: 88 y.o. MRN: [id number]  CC: Follow-up (3 month f/u)   HPI ARIYAH SEDLACK presents for syncope - passed out this am; dementia, hearing loss  Here w/her dtr  Outpatient Medications Prior to Visit  Medication Sig Dispense Refill   apixaban (ELIQUIS) 2.5 MG TABS tablet Take 1 tablet (2.5 mg total) by mouth 2 (two) times daily. 60 tablet 3   aspirin-acetaminophen-caffeine (EXCEDRIN MIGRAINE) 250-250-65 MG tablet Take 2 tablets by mouth as needed for headache. Pt takes 250-65 mg as needed.     Cholecalciferol (VITAMIN D-3) 25 MCG (1000 UT) CAPS Take 1,000 Units by mouth daily.     Multiple Vitamins-Minerals (ONE-A-DAY WOMENS 50+) TABS Take 1 tablet by mouth daily with breakfast.     Omega-3 Fatty Acids (FISH OIL PO) Take 1,000 mg by mouth every morning.     pantoprazole (PROTONIX) 40 MG tablet Take 1 tablet (40 mg total) by mouth daily. 90 tablet 3   Polyvinyl Alcohol-Povidone (REFRESH OP) Place 1-2 drops into both eyes daily as needed (dry eyes).     senna-docusate (SENOKOT-S) 8.6-50 MG tablet Take 2 tablets by mouth 2 (two) times daily. 60 tablet 5   simvastatin (ZOCOR) 40 MG tablet Take 1 tablet (40 mg total) by mouth daily at 6 PM. 90 tablet 1   vitamin C (ASCORBIC ACID) 500 MG tablet Take 500 mg by mouth daily.     Wheat Dextrin (BENEFIBER PO) Take by mouth See admin instructions. Mix 1 teaspoonful of powder into 4-8 ounces of water and drink once a day as needed for mild constipation     donepezil (ARICEPT) 10 MG tablet Take 1 tablet (10 mg total) by mouth at bedtime. 90 tablet 3   ramipril (ALTACE) 2.5 MG capsule TAKE ONE CAPSULE BY MOUTH DAILY 90 capsule 3   metoprolol tartrate (LOPRESSOR) 50 MG tablet TAKE 1 AND 1/2 TABLET BY MOUTH TWO TIMES A DAY (Patient not taking: Reported on 08/25/2022) 270 tablet 1   No facility-administered medications prior to visit.    ROS: Review of Systems  Constitutional:   Negative for activity change, appetite change, chills, fatigue and unexpected weight change.  HENT:  Negative for congestion, mouth sores and sinus pressure.   Eyes:  Negative for visual disturbance.  Respiratory:  Negative for cough and chest tightness.   Cardiovascular:  Negative for palpitations.  Gastrointestinal:  Negative for abdominal pain and nausea.  Genitourinary:  Negative for difficulty urinating, frequency and vaginal pain.  Musculoskeletal:  Negative for back pain and gait problem.  Skin:  Negative for pallor and rash.  Neurological:  Positive for syncope. Negative for dizziness, tremors, weakness, numbness and headaches.  Psychiatric/Behavioral:  Positive for decreased concentration. Negative for confusion, sleep disturbance and suicidal ideas.     Objective:  BP 122/68 (BP Location: Left Arm)   Pulse 63   Temp 98 F (36.7 C) (Oral)   Ht 5\' 2"  (1.575 m)   Wt 103 lb 6.4 oz (46.9 kg)   SpO2 97%   BMI 18.91 kg/m   BP Readings from Last 3 Encounters:  08/25/22 122/68  08/25/22 122/68  08/18/22 134/72    Wt Readings from Last 3 Encounters:  08/25/22 103 lb 6.3 oz (46.9 kg)  08/25/22 103 lb 6.4 oz (46.9 kg)  08/18/22 104 lb (47.2 kg)    Physical Exam Constitutional:      General: She is not in acute  distress.    Appearance: Normal appearance. She is well-developed.  HENT:     Head: Normocephalic.     Right Ear: External ear normal.     Left Ear: External ear normal.     Nose: Nose normal.  Eyes:     General:        Right eye: No discharge.        Left eye: No discharge.     Conjunctiva/sclera: Conjunctivae normal.     Pupils: Pupils are equal, round, and reactive to light.  Neck:     Thyroid: No thyromegaly.     Vascular: No JVD.     Trachea: No tracheal deviation.  Cardiovascular:     Rate and Rhythm: Normal rate and regular rhythm.     Heart sounds: Normal heart sounds.  Pulmonary:     Effort: No respiratory distress.     Breath sounds: No  stridor. No wheezing.  Abdominal:     General: Bowel sounds are normal. There is no distension.     Palpations: Abdomen is soft. There is no mass.     Tenderness: There is no abdominal tenderness. There is no guarding or rebound.  Musculoskeletal:        General: No tenderness.     Cervical back: Normal range of motion and neck supple. No rigidity.  Lymphadenopathy:     Cervical: No cervical adenopathy.  Skin:    Findings: No erythema or rash.  Neurological:     Cranial Nerves: No cranial nerve deficit.     Motor: No abnormal muscle tone.     Coordination: Coordination normal.     Deep Tendon Reflexes: Reflexes normal.  Psychiatric:        Behavior: Behavior normal.        Thought Content: Thought content normal.        Judgment: Judgment normal.   Hematoma on the occipital area    A total time of 45 minutes was spent preparing to see the patient, reviewing tests, x-rays, operative reports and other medical records.  Also, obtaining history and performing comprehensive physical exam.  Additionally, counseling the patient regarding the above listed issues.   Finally, documenting clinical information in the health records, coordination of care, educating the patient and dtr re falls, syncope.    Lab Results  Component Value Date   WBC 9.1 08/16/2022   HGB 10.9 (L) 08/16/2022   HCT 34.2 (L) 08/16/2022   PLT 319 08/16/2022   GLUCOSE 130 (H) 08/16/2022   CHOL 126 02/12/2022   TRIG 147.0 02/12/2022   HDL 45.10 02/12/2022   LDLDIRECT 84.0 12/11/2019   LDLCALC 52 02/12/2022   ALT 12 03/05/2022   AST 20 03/05/2022   NA 139 08/16/2022   K 4.4 08/16/2022   CL 103 08/16/2022   CREATININE 0.74 08/16/2022   BUN 15 08/16/2022   CO2 26 08/16/2022   TSH 1.503 03/04/2022   HGBA1C 6.6 (H) 02/12/2022   MICROALBUR 1.4 02/12/2022    CT Head Wo Contrast  Result Date: 08/16/2022 CLINICAL DATA:  Sudden onset severe headache EXAM: CT HEAD WITHOUT CONTRAST TECHNIQUE: Contiguous axial  images were obtained from the base of the skull through the vertex without intravenous contrast. RADIATION DOSE REDUCTION: This exam was performed according to the departmental dose-optimization program which includes automated exposure control, adjustment of the mA and/or kV according to patient size and/or use of iterative reconstruction technique. COMPARISON:  Head CT 07/03/2022 FINDINGS: Brain: There is no acute intracranial hemorrhage,  extra-axial fluid collection, or acute infarct Parenchymal volume is normal. The ventricles are normal in size. Gray-white differentiation is preserved. There is no mass lesion.  There is no mass effect or midline shift. Vascular: There is calcification of the bilateral carotid siphons. Skull: Normal. Negative for fracture or focal lesion. Sinuses/Orbits: The imaged paranasal sinuses are clear. Bilateral lens implants are in place. The globes and orbits are otherwise unremarkable. Other: None. IMPRESSION: Stable noncontrast head CT with no acute intracranial pathology. Electronically Signed   By: Lesia Hausen M.D.   On: 08/16/2022 15:25    Assessment & Plan:   Problem List Items Addressed This Visit     Anemia    Monitor CBC      Falls frequently    See med changes Stop the Ramipril Reduce Donepezil to 5 mg/day Hydrate well      Memory change    Pt lives at home      Scalp hematoma    Head CT today      Syncope and collapse    Recurrent syncope Stop the Ramipril; reduce Aricept Cardiol (Dr Ladona Ridgel) appt is pending      Relevant Orders   CT HEAD WO CONTRAST ( ) (Completed)   Type 2 diabetes, controlled, with neuropathy (HCC) (Chronic)     Diet controlled - no medications. Continue w/diet. Monitor A1c         Meds ordered this encounter  Medications   donepezil (ARICEPT) 5 MG tablet    Sig: Take 1 tablet (5 mg total) by mouth at bedtime.    Dispense:  90 tablet    Refill:  3      Follow-up: Return in about 3 months (around  11/25/2022) for a follow-up visit.  Sonda Primes, MD

## 2022-08-25 NOTE — Assessment & Plan Note (Addendum)
Recurrent syncope Stop the Ramipril; reduce Aricept Cardiol (Dr Lovena Le) appt is pending

## 2022-08-25 NOTE — Patient Instructions (Signed)
Toni Mcmillan , Thank you for taking time to come for your Medicare Wellness Visit. I appreciate your ongoing commitment to your health goals. Please review the following plan we discussed and let me know if I can assist you in the future.   These are the goals we discussed:  Goals      MAINTAIN [place].        This is a list of the screening recommended for you and due dates:  Health Maintenance  Topic Date Due   Complete foot exam   Never done   Eye exam for diabetics  Never done   Zoster (Shingles) Vaccine (1 of 2) Never done   DEXA scan (bone density measurement)  Never done   COVID-19 Vaccine (3 - Pfizer series) 02/23/2020   Tetanus Vaccine  06/08/2022   Flu Shot  06/15/2022   Hemoglobin A1C  08/14/2022   Pneumonia Vaccine  Completed   HPV Vaccine  Aged Out    Advanced directives: YES  Conditions/risks identified: YES; HISTORY OF FALLS  Next appointment: Follow up in one year for your annual wellness visit.   Preventive Care 86 Years and Older, Female Preventive care refers to lifestyle choices and visits with your health care provider that can promote health and wellness. What does preventive care include? A yearly physical exam. This is also called an annual well check. Dental exams once or twice a year. Routine eye exams. Ask your health care provider how often you should have your eyes checked. Personal lifestyle choices, including: Daily care of your teeth and gums. Regular physical activity. Eating a healthy diet. Avoiding tobacco and drug use. Limiting alcohol use. Practicing safe sex. Taking low-dose aspirin every day. Taking vitamin and mineral supplements as recommended by your health care provider. What happens during an annual well check? The services and screenings done by your health care provider during your annual well check will depend on your age, overall health, lifestyle risk factors, and family history of  disease. Counseling  Your health care provider may ask you questions about your: Alcohol use. Tobacco use. Drug use. Emotional well-being. Home and relationship well-being. Sexual activity. Eating habits. History of falls. Memory and ability to understand (cognition). Work and work Statistician. Reproductive health. Screening  You may have the following tests or measurements: Height, weight, and BMI. Blood pressure. Lipid and cholesterol levels. These may be checked every 5 years, or more frequently if you are over 80 years old. Skin check. Lung cancer screening. You may have this screening every year starting at age 86 if you have a 30-pack-year history of smoking and currently smoke or have quit within the past 15 years. Fecal occult blood test (FOBT) of the stool. You may have this test every year starting at age 86 Flexible sigmoidoscopy or colonoscopy. You may have a sigmoidoscopy every 5 years or a colonoscopy every 10 years starting at age 86 Hepatitis C blood test. Hepatitis B blood test. Sexually transmitted disease (STD) testing. Diabetes screening. This is done by checking your blood sugar (glucose) after you have not eaten for a while (fasting). You may have this done every 1-3 years. Bone density scan. This is done to screen for osteoporosis. You may have this done starting at age 86 Mammogram. This may be done every 1-2 years. Talk to your health care provider about how often you should have regular mammograms. Talk with your health care provider about your test results, treatment options, and if  necessary, the need for more tests. Vaccines  Your health care provider may recommend certain vaccines, such as: Influenza vaccine. This is recommended every year. Tetanus, diphtheria, and acellular pertussis (Tdap, Td) vaccine. You may need a Td booster every 10 years. Zoster vaccine. You may need this after age 58. Pneumococcal 13-valent conjugate (PCV13) vaccine. One  dose is recommended after age 86 Pneumococcal polysaccharide (PPSV23) vaccine. One dose is recommended after age 86 Talk to your health care provider about which screenings and vaccines you need and how often you need them. This information is not intended to replace advice given to you by your health care provider. Make sure you discuss any questions you have with your health care provider. Document Released: 11/28/2015 Document Revised: 07/21/2016 Document Reviewed: 09/02/2015 Elsevier Interactive Patient Education  2017 [place] Prevention in the Home Falls can cause injuries. They can happen to people of all ages. There are many things you can do to make your home safe and to help prevent falls. What can I do on the outside of my home? Regularly fix the edges of walkways and driveways and fix any cracks. Remove anything that might make you trip as you walk through a door, such as a raised step or threshold. Trim any bushes or trees on the path to your home. Use bright outdoor lighting. Clear any walking paths of anything that might make someone trip, such as rocks or tools. Regularly check to see if handrails are loose or broken. Make sure that both sides of any steps have handrails. Any raised decks and porches should have guardrails on the edges. Have any leaves, snow, or ice cleared regularly. Use sand or salt on walking paths during winter. Clean up any spills in your garage right away. This includes oil or grease spills. What can I do in the bathroom? Use night lights. Install grab bars by the toilet and in the tub and shower. Do not use towel bars as grab bars. Use non-skid mats or decals in the tub or shower. If you need to sit down in the shower, use a plastic, non-slip stool. Keep the floor dry. Clean up any water that spills on the floor as soon as it happens. Remove soap buildup in the tub or shower regularly. Attach bath mats securely with double-sided  non-slip rug tape. Do not have throw rugs and other things on the floor that can make you trip. What can I do in the bedroom? Use night lights. Make sure that you have a light by your bed that is easy to reach. Do not use any sheets or blankets that are too big for your bed. They should not hang down onto the floor. Have a firm chair that has side arms. You can use this for support while you get dressed. Do not have throw rugs and other things on the floor that can make you trip. What can I do in the kitchen? Clean up any spills right away. Avoid walking on wet floors. Keep items that you use a lot in easy-to-reach places. If you need to reach something above you, use a strong step stool that has a grab bar. Keep electrical cords out of the way. Do not use floor polish or wax that makes floors slippery. If you must use wax, use non-skid floor wax. Do not have throw rugs and other things on the floor that can make you trip. What can I do with my stairs? Do not leave any items  on the stairs. Make sure that there are handrails on both sides of the stairs and use them. Fix handrails that are broken or loose. Make sure that handrails are as long as the stairways. Check any carpeting to make sure that it is firmly attached to the stairs. Fix any carpet that is loose or worn. Avoid having throw rugs at the top or bottom of the stairs. If you do have throw rugs, attach them to the floor with carpet tape. Make sure that you have a light switch at the top of the stairs and the bottom of the stairs. If you do not have them, ask someone to add them for you. What else can I do to help prevent falls? Wear shoes that: Do not have high heels. Have rubber bottoms. Are comfortable and fit you well. Are closed at the toe. Do not wear sandals. If you use a stepladder: Make sure that it is fully opened. Do not climb a closed stepladder. Make sure that both sides of the stepladder are locked into place. Ask  someone to hold it for you, if possible. Clearly mark and make sure that you can see: Any grab bars or handrails. First and last steps. Where the edge of each step is. Use tools that help you move around (mobility aids) if they are needed. These include: Canes. Walkers. Scooters. Crutches. Turn on the lights when you go into a dark area. Replace any light bulbs as soon as they burn out. Set up your furniture so you have a clear path. Avoid moving your furniture around. If any of your floors are uneven, fix them. If there are any pets around you, be aware of where they are. Review your medicines with your doctor. Some medicines can make you feel dizzy. This can increase your chance of falling. Ask your doctor what other things that you can do to help prevent falls. This information is not intended to replace advice given to you by your health care provider. Make sure you discuss any questions you have with your health care provider. Document Released: 08/28/2009 Document Revised: 04/08/2016 Document Reviewed: 12/06/2014 Elsevier Interactive Patient Education  2017 [employer].

## 2022-08-25 NOTE — Assessment & Plan Note (Addendum)
See med changes Stop the Ramipril Reduce Donepezil to 5 mg/day Hydrate well

## 2022-08-25 NOTE — Patient Instructions (Addendum)
Stop the Ramipril Reduce Donepezil to 5 mg/day Hydrate well

## 2022-08-25 NOTE — Assessment & Plan Note (Signed)
Monitor CBC 

## 2022-09-03 ENCOUNTER — Encounter: Payer: Self-pay | Admitting: Podiatry

## 2022-09-03 ENCOUNTER — Ambulatory Visit: Payer: Medicare Other | Admitting: Cardiovascular Disease

## 2022-09-03 ENCOUNTER — Ambulatory Visit: Payer: Medicare Other | Admitting: Podiatry

## 2022-09-03 DIAGNOSIS — E1140 Type 2 diabetes mellitus with diabetic neuropathy, unspecified: Secondary | ICD-10-CM | POA: Diagnosis not present

## 2022-09-03 DIAGNOSIS — M79674 Pain in right toe(s): Secondary | ICD-10-CM

## 2022-09-03 DIAGNOSIS — B351 Tinea unguium: Secondary | ICD-10-CM | POA: Diagnosis not present

## 2022-09-03 DIAGNOSIS — M79675 Pain in left toe(s): Secondary | ICD-10-CM | POA: Diagnosis not present

## 2022-09-03 NOTE — Progress Notes (Signed)

## 2022-09-04 ENCOUNTER — Observation Stay (HOSPITAL_COMMUNITY)
Admission: EM | Admit: 2022-09-04 | Discharge: 2022-09-05 | Disposition: A | Discharge status: Home / Self Care | Payer: Medicare Other | Attending: Family Medicine | Admitting: Family Medicine

## 2022-09-04 ENCOUNTER — Other Ambulatory Visit: Payer: Self-pay

## 2022-09-04 ENCOUNTER — Emergency Department (HOSPITAL_COMMUNITY): Payer: Medicare Other

## 2022-09-04 ENCOUNTER — Encounter (HOSPITAL_COMMUNITY): Payer: Self-pay

## 2022-09-04 DIAGNOSIS — M549 Dorsalgia, unspecified: Secondary | ICD-10-CM | POA: Diagnosis not present

## 2022-09-04 DIAGNOSIS — R0902 Hypoxemia: Secondary | ICD-10-CM | POA: Diagnosis not present

## 2022-09-04 DIAGNOSIS — E1140 Type 2 diabetes mellitus with diabetic neuropathy, unspecified: Secondary | ICD-10-CM | POA: Diagnosis present

## 2022-09-04 DIAGNOSIS — Z7982 Long term (current) use of aspirin: Secondary | ICD-10-CM | POA: Insufficient documentation

## 2022-09-04 DIAGNOSIS — R778 Other specified abnormalities of plasma proteins: Secondary | ICD-10-CM | POA: Diagnosis not present

## 2022-09-04 DIAGNOSIS — R7989 Other specified abnormal findings of blood chemistry: Secondary | ICD-10-CM | POA: Diagnosis not present

## 2022-09-04 DIAGNOSIS — I10 Essential (primary) hypertension: Secondary | ICD-10-CM | POA: Diagnosis present

## 2022-09-04 DIAGNOSIS — R413 Other amnesia: Secondary | ICD-10-CM | POA: Diagnosis present

## 2022-09-04 DIAGNOSIS — I480 Paroxysmal atrial fibrillation: Secondary | ICD-10-CM | POA: Diagnosis not present

## 2022-09-04 DIAGNOSIS — Z7901 Long term (current) use of anticoagulants: Secondary | ICD-10-CM | POA: Insufficient documentation

## 2022-09-04 DIAGNOSIS — I4891 Unspecified atrial fibrillation: Secondary | ICD-10-CM | POA: Diagnosis not present

## 2022-09-04 DIAGNOSIS — Z79899 Other long term (current) drug therapy: Secondary | ICD-10-CM | POA: Diagnosis not present

## 2022-09-04 DIAGNOSIS — R079 Chest pain, unspecified: Secondary | ICD-10-CM | POA: Diagnosis not present

## 2022-09-04 DIAGNOSIS — R42 Dizziness and giddiness: Secondary | ICD-10-CM | POA: Diagnosis not present

## 2022-09-04 DIAGNOSIS — Z91040 Latex allergy status: Secondary | ICD-10-CM | POA: Diagnosis not present

## 2022-09-04 DIAGNOSIS — R55 Syncope and collapse: Principal | ICD-10-CM | POA: Diagnosis present

## 2022-09-04 DIAGNOSIS — F0280 Dementia in other diseases classified elsewhere without behavioral disturbance: Secondary | ICD-10-CM | POA: Diagnosis present

## 2022-09-04 LAB — CBC
HCT: 36.3 % (ref 36.0–46.0)
Hemoglobin: 11.3 g/dL — ABNORMAL LOW (ref 12.0–15.0)
MCH: 29.7 pg (ref 26.0–34.0)
MCHC: 31.1 g/dL (ref 30.0–36.0)
MCV: 95.3 fL (ref 80.0–100.0)
Platelets: 336 10*3/uL (ref 150–400)
RBC: 3.81 MIL/uL — ABNORMAL LOW (ref 3.87–5.11)
RDW: 12.6 % (ref 11.5–15.5)
WBC: 8.2 10*3/uL (ref 4.0–10.5)
nRBC: 0 % (ref 0.0–0.2)

## 2022-09-04 LAB — BASIC METABOLIC PANEL
Anion gap: 9 (ref 5–15)
BUN: 16 mg/dL (ref 8–23)
CO2: 24 mmol/L (ref 22–32)
Calcium: 9.2 mg/dL (ref 8.9–10.3)
Chloride: 108 mmol/L (ref 98–111)
Creatinine, Ser: 0.89 mg/dL (ref 0.44–1.00)
GFR, Estimated: >60 mL/min (ref >60)
Glucose, Bld: 138 mg/dL — ABNORMAL HIGH (ref 70–99)
Potassium: 3.9 mmol/L (ref 3.5–5.1)
Sodium: 141 mmol/L (ref 135–145)

## 2022-09-04 LAB — TROPONIN I (HIGH SENSITIVITY)
Troponin I (High Sensitivity): 11 ng/L (ref <18)
Troponin I (High Sensitivity): 47 ng/L — ABNORMAL HIGH (ref <18)

## 2022-09-04 LAB — URINALYSIS, ROUTINE W REFLEX MICROSCOPIC
Bilirubin Urine: NEGATIVE
Glucose, UA: NEGATIVE mg/dL
Hgb urine dipstick: NEGATIVE
Ketones, ur: 15 mg/dL — AB
Nitrite: NEGATIVE
Protein, ur: NEGATIVE mg/dL
Specific Gravity, Urine: 1.01 (ref 1.005–1.030)
pH: 6 (ref 5.0–8.0)

## 2022-09-04 LAB — URINALYSIS, MICROSCOPIC (REFLEX): Squamous Epithelial / HPF: NONE SEEN (ref 0–5)

## 2022-09-04 LAB — CBG MONITORING, ED
Glucose-Capillary: 106 mg/dL — ABNORMAL HIGH (ref 70–99)
Glucose-Capillary: 127 mg/dL — ABNORMAL HIGH (ref 70–99)

## 2022-09-04 MED ORDER — SIMVASTATIN 20 MG PO TABS: 40.0000 mg | ORAL_TABLET | Freq: Every day | ORAL | Status: DC

## 2022-09-04 MED ORDER — ACETAMINOPHEN 650 MG RE SUPP: 650.0000 mg | Freq: Four times a day (QID) | RECTAL | Status: DC | PRN

## 2022-09-04 MED ORDER — APIXABAN 2.5 MG PO TABS
2.5000 mg | ORAL_TABLET | Freq: Two times a day (BID) | ORAL | Status: DC
  Administered 2022-09-05: 2.5 mg via ORAL
  Filled 2022-09-04: qty 1

## 2022-09-04 MED ORDER — ACETAMINOPHEN 325 MG PO TABS: 650.0000 mg | ORAL_TABLET | Freq: Four times a day (QID) | ORAL | Status: DC | PRN

## 2022-09-04 MED ORDER — INSULIN ASPART 100 UNIT/ML IJ SOLN: 0.0000 [IU] | Freq: Three times a day (TID) | INTRAMUSCULAR | Status: DC

## 2022-09-04 MED ORDER — SODIUM CHLORIDE 0.9 % IV SOLN: 250.0000 mL | INTRAVENOUS | Status: DC | PRN

## 2022-09-04 MED ORDER — INSULIN ASPART 100 UNIT/ML IJ SOLN: 0.0000 [IU] | Freq: Every day | INTRAMUSCULAR | Status: DC

## 2022-09-04 MED ORDER — SENNOSIDES-DOCUSATE SODIUM 8.6-50 MG PO TABS: 1.0000 | ORAL_TABLET | Freq: Every evening | ORAL | Status: DC | PRN

## 2022-09-04 MED ORDER — SODIUM CHLORIDE 0.9% FLUSH
3.0000 mL | Freq: Two times a day (BID) | INTRAVENOUS | Status: DC
  Administered 2022-09-04: 3 mL via INTRAVENOUS

## 2022-09-04 MED ORDER — DONEPEZIL HCL 10 MG PO TABS: 5.0000 mg | ORAL_TABLET | Freq: Every day | ORAL | Status: DC

## 2022-09-04 MED ORDER — SODIUM CHLORIDE 0.9% FLUSH: 3.0000 mL | INTRAVENOUS | Status: DC | PRN

## 2022-09-04 MED ORDER — SODIUM CHLORIDE 0.9% FLUSH
3.0000 mL | Freq: Two times a day (BID) | INTRAVENOUS | Status: DC
  Administered 2022-09-04 – 2022-09-05 (×2): 3 mL via INTRAVENOUS

## 2022-09-04 NOTE — ED Triage Notes (Signed)
Pt BIB [facility] from home for near syncopal episode. Patient dizzy when attempting to stand and sat down immediately. Patient found to be in afib RVR. HR in the 160s. Denies chest pain, SHOB, n/v/d. Only complains of back pain. AOX4  EMS gave 10 mg cardizem and 500 ml ns w/ conversion to NS in the 70s.   BP stable CBG 156

## 2022-09-04 NOTE — ED Triage Notes (Signed)
Patient had a near syncopal episode while fixing coffee this am.

## 2022-09-04 NOTE — H&P (Addendum)
History and Physical    Toni Mcmillan PJK:[id number] DOB: [birth year] DOA: 09/04/2022  PCP: Tresa Garter, MD   Patient coming from: Home   Chief Complaint: Lightheaded, near-syncope, rapid atrial fibrillation  HPI: Toni Mcmillan is a pleasant 88 y.o. female with medical history significant for diet-controlled diabetes mellitus, hypertension, memory loss, and paroxysmal atrial fibrillation on Eliquis who presents to the emergency department after an episode of lightheadedness with near syncope.  She describes being in her usual state when she had acute onset of lightheadedness, vision change described as seen lights, and felt as though she was about to pass out.  She was able to sit down and call her neighbor prior to losing consciousness.  EMS found the patient to be in rapid atrial fibrillation with rate 160s and administered 10 mg IV diltiazem. She converted to SR prior to arrival in ED.   She reports 4 or 5 syncopal or near-syncopal episodes in the past month. She was evaluated by cardiology for this and wore an ambulatory monitor that revealed tachycardia-bradycardia syndrome, AF with both rapid and slow ventricular response, and SVT with rates up to 200-range. She is no longer taking metoprolol or ramipril.   ED Course: Upon arrival to the ED, patient is found to be afebrile and saturating well on room air with stable blood pressure.  EKG features sinus rhythm and chest x-ray is negative for acute cardiopulmonary disease.  Chemistry panel and CBC are unremarkable.  Troponin was 11 and then 47.  Cardiology fellow (Dr. Rosita Fire) was consulted by the ED physician and recommended medical admission.  Review of Systems:  All other systems reviewed and apart from HPI, are negative.  Past Medical History:  Diagnosis Date   A-fib (HCC)    Allergic rhinitis, cause unspecified    Diabetes mellitus without complication (HCC)    borderline"never on meds"   Disturbances of sensation of  smell and taste    Dizziness    Headache(784.0)    has decreased to none in later years   Insomnia, unspecified    Other and unspecified hyperlipidemia    Personal history of other diseases of digestive system    Scarlet fever    Unspecified essential hypertension    Metoprolol decreased due to drop in BP readings.   Unspecified sinusitis (chronic)    Whooping cough, unspecified organism     Past Surgical History:  Procedure Laterality Date   ABDOMINAL HYSTERECTOMY     ANAL RECTAL MANOMETRY N/A 07/07/2015   Procedure: ANO RECTAL MANOMETRY;  Surgeon: Charolett Bumpers, MD;  Location: [facility];  Service: Endoscopy;  Laterality: N/A;   BILATERAL OOPHORECTOMY     CATARACT EXTRACTION, BILATERAL     COLONOSCOPY W/ POLYPECTOMY     COLONOSCOPY WITH PROPOFOL N/A 05/29/2015   Procedure: COLONOSCOPY WITH PROPOFOL;  Surgeon: Charolett Bumpers, MD;  Location: [facility];  Service: Endoscopy;  Laterality: N/A;   correction of hammertoe deformity     CYSTOSCOPY     w/ stenting of both ureters   HEMORRHOID SURGERY     hydrosalpinx diagnostic laparoscopy     TONSILLECTOMY  age 12   vericose vein surgery      Social History:   reports that she has never smoked. She has never used smokeless tobacco. She reports that she does not drink alcohol and does not use drugs.  Allergies  Allergen Reactions   Celecoxib Other (See Comments)    Reaction not recalled    Ciprofloxacin Swelling  and Other (See Comments)    Caused wrist swelling   Cleocin [Clindamycin] Itching   Clindamycin Hcl Itching   Erythromycin Other (See Comments)    GI upset   Gabapentin Other (See Comments)    Made the patient feel not well the next day   Meperidine Itching   Meperidine Hcl Itching   Morphine Itching   Morphine Sulfate Itching   Tetracycline Other (See Comments)    GI upset   Triprolidine-Pseudoephedrine Other (See Comments)    Reaction not recalled   Zolpidem Other (See Comments)    "Did not have a  good sleep"   Latex Itching and Rash    Family History  Problem Relation Age of Onset   Cancer Mother        ?   Diabetes Mother    Other Mother        coagulopathy/ ruptured viscus   Hypertension Father    Hyperlipidemia Father        ?   Other Father        CVA   Cancer Paternal Grandmother        breast     Prior to Admission medications   Medication Sig Start Date End Date Taking? Authorizing Provider  apixaban (ELIQUIS) 2.5 MG TABS tablet Take 1 tablet (2.5 mg total) by mouth 2 (two) times daily. 03/08/22   Fenton, Clint R, PA  aspirin-acetaminophen-caffeine (EXCEDRIN MIGRAINE) [phone] MG tablet Take 2 tablets by mouth as needed for headache. Pt takes 250-65 mg as needed.    [provider]  Cholecalciferol (VITAMIN D-3) 25 MCG (1000 UT) CAPS Take 1,000 Units by mouth daily.    [provider]  donepezil (ARICEPT) 5 MG tablet Take 1 tablet (5 mg total) by mouth at bedtime. 08/25/22   Plotnikov, Georgina Quint, MD  Multiple Vitamins-Minerals (ONE-A-DAY WOMENS 50+) TABS Take 1 tablet by mouth daily with breakfast.    [provider]  Omega-3 Fatty Acids (FISH OIL PO) Take 1,000 mg by mouth every morning.    [provider]  pantoprazole (PROTONIX) 40 MG tablet Take 1 tablet (40 mg total) by mouth daily. 12/11/19   Plotnikov, Georgina Quint, MD  Polyvinyl Alcohol-Povidone (REFRESH OP) Place 1-2 drops into both eyes daily as needed (dry eyes).    [provider]  senna-docusate (SENOKOT-S) 8.6-50 MG tablet Take 2 tablets by mouth 2 (two) times daily. 11/25/21 11/25/22  Plotnikov, Georgina Quint, MD  simvastatin (ZOCOR) 40 MG tablet Take 1 tablet (40 mg total) by mouth daily at 6 PM. 04/02/22   Plotnikov, Georgina Quint, MD  vitamin C (ASCORBIC ACID) 500 MG tablet Take 500 mg by mouth daily.    [provider]  Wheat Dextrin (BENEFIBER PO) Take by mouth See admin instructions. Mix 1 teaspoonful of powder into 4-8 ounces of water and drink once a day  as needed for mild constipation    [provider]    Physical Exam: Vitals:   09/04/22 1745 09/04/22 1800 09/04/22 1815 09/04/22 1830  BP: (!) 149/69 124/77 127/68 135/63  Pulse: (!) 57 60 61 64  Resp: 16 19 14 14   Temp:      TempSrc:      SpO2: 100% 98% 99% 98%  Weight:      Height:        Constitutional: NAD, calm  Eyes: PERTLA, lids and conjunctivae normal ENMT: Mucous membranes are moist. Posterior pharynx clear of any exudate or lesions.   Neck: supple, no  masses  Respiratory:  no wheezing, no crackles. No accessory muscle use.  Cardiovascular: S1 & S2 heard, regular rate and rhythm. No extremity edema.   Abdomen: No distension, no tenderness, soft. Bowel sounds active.  Musculoskeletal: no clubbing / cyanosis. No joint deformity upper and lower extremities.   Skin: no significant rashes, lesions, ulcers. Warm, dry, well-perfused. Neurologic: CN 2-12 grossly intact. Moving all extremities. Alert and oriented to person, place, and situation.  Psychiatric: Pleasant. Cooperative.    Labs and Imaging on Admission: I have personally reviewed following labs and imaging studies  CBC: Recent Labs  Lab 09/04/22 1139  WBC 8.2  HGB 11.3*  HCT 36.3  MCV 95.3  PLT 222   Basic Metabolic Panel: Recent Labs  Lab 09/04/22 1139  NA 141  K 3.9  CL 108  CO2 24  GLUCOSE 138*  BUN 16  CREATININE 0.89  CALCIUM 9.2   GFR: Estimated Creatinine Clearance: 31.1 mL/min (by C-G formula based on SCr of 0.89 mg/dL). Liver Function Tests: No results for input(s): "AST", "ALT", "ALKPHOS", "BILITOT", "PROT", "ALBUMIN" in the last 168 hours. No results for input(s): "LIPASE", "AMYLASE" in the last 168 hours. No results for input(s): "AMMONIA" in the last 168 hours. Coagulation Profile: No results for input(s): "INR", "PROTIME" in the last 168 hours. Cardiac Enzymes: No results for input(s): "CKTOTAL", "CKMB", "CKMBINDEX", "TROPONINI" in the last 168 hours. BNP (last 3  results) No results for input(s): "PROBNP" in the last 8760 hours. HbA1C: No results for input(s): "HGBA1C" in the last 72 hours. CBG: Recent Labs  Lab 09/04/22 1136  GLUCAP 127*   Lipid Profile: No results for input(s): "CHOL", "HDL", "LDLCALC", "TRIG", "CHOLHDL", "LDLDIRECT" in the last 72 hours. Thyroid Function Tests: No results for input(s): "TSH", "T4TOTAL", "FREET4", "T3FREE", "THYROIDAB" in the last 72 hours. Anemia Panel: No results for input(s): "VITAMINB12", "FOLATE", "FERRITIN", "TIBC", "IRON", "RETICCTPCT" in the last 72 hours. Urine analysis:    Component Value Date/Time   COLORURINE YELLOW 09/04/2022 [place] 09/04/2022 1134   LABSPEC 1.010 09/04/2022 1134   PHURINE 6.0 09/04/2022 1134   GLUCOSEU NEGATIVE 09/04/2022 1134   GLUCOSEU NEGATIVE 12/24/2020 1452   HGBUR NEGATIVE 09/04/2022 1134   BILIRUBINUR NEGATIVE 09/04/2022 1134   BILIRUBINUR negative 09/26/2019 1058   KETONESUR 15 (A) 09/04/2022 1134   PROTEINUR NEGATIVE 09/04/2022 1134   UROBILINOGEN 0.2 12/24/2020 1452   NITRITE NEGATIVE 09/04/2022 1134   LEUKOCYTESUR MODERATE (A) 09/04/2022 1134   Sepsis Labs: @LABRCNTIP (procalcitonin:4,lacticidven:4) )No results found for this or any previous visit (from the past 240 hour(s)).   Radiological Exams on Admission: DG Chest 1 View  Result Date: 09/04/2022 CLINICAL DATA:  Chest pain EXAM: CHEST  1 VIEW COMPARISON:  07/03/2022 FINDINGS: The heart size and mediastinal contours are within normal limits. Aortic atherosclerosis. No focal airspace consolidation, pleural effusion, or pneumothorax. The visualized skeletal structures are unremarkable. IMPRESSION: No active disease. Electronically Signed   By: Davina Poke D.O.   On: 09/04/2022 12:25    EKG: Independently reviewed. Sinus rhythm.   Assessment/Plan   1. Near-syncope  - Presents after a near-syncopal episode in setting of rapid a fib with rate 160s; she reports 4-5 similar  episodes this month  - She converted to SR prior to arrival in ED and is now asymptomatic  - Recent ambulatory heart monitoring revealed tachycardia-bradycardia syndrome, AF with both rapid and slow ventricular response, and SVT with rates up to 200-range - [place] cardiology consultation, will follow-up on recommendations  2. PAF with RVR  - She was in a fib with rate 160s with EMS, was treated with 10 mg IV diltiazem, and converted to SR prior to arrival  - Continue cardiac monitoring, continue Eliquis (2.5 mg BID given age and weight)   3. Elevated troponin  - Troponin was 11 and then 47 five hours later  - No chest pain or acute ischemic features on EKG, likely secondary to rapid a fib  - Continue cardiac monitoring, repeat troponin, follow-up cardiology recommendations    4. Hypertension  - Antihypertensives stopped in August 2023  - Monitor and treat as-needed only for now    5. Type II DM  - A1c was 6.6% in March 2023  - Diet-controlled at home  - Monitor and use low-intensity SSI if needed   6. Memory loss  - Continue Aricept, use delirium precautions    DVT prophylaxis: Eliquis  Code Status: Full  Level of Care: Level of care: Telemetry Cardiac Family Communication: son at bedside  Disposition Plan:  Patient is from: home  Anticipated d/c is to: TBD Anticipated d/c date is: 10/22 or 09/06/22  Patient currently: Pending cardiology consultation  Consults called: Cardiology  Admission status: Observation     Briscoe Deutscher, MD [facility]  09/04/2022, 7:38 PM

## 2022-09-04 NOTE — ED Provider Triage Note (Signed)
Emergency Medicine Provider Triage Evaluation Note  Toni Mcmillan , a 88 y.o. female  was evaluated in triage.  Pt complains of near syncopal event.  Patient says she was making coffee earlier this morning and felt dizzy/lightheaded with some visual changes.  She sat down in her recliner and called her neighbor who then called EMS.  Patient is accompanied by daughter who is cohistorian.  Patient's daughter states that patient has had 3 similar episodes in the past week and is increasingly concerned about a fall on blood thinner.  Patient has a known history of A-fib of which is anticoagulated on Eliquis.  Per EMS, patient was in A-fib with RVR with a rate of 160 which was converted with 10 mg of Cardizem.  Patient is currently complaining of no symptoms..  Review of Systems  Positive: See above Negative:   Physical Exam  BP (!) 141/94   Pulse 77   Temp 97.7 F (36.5 C) (Oral)   Resp 16   SpO2 98%  Gen:   Awake, no distress   Resp:  Normal effort  MSK:   Moves extremities without difficulty  Other:  Normal rate and rhythm  Medical Decision Making  Medically screening exam initiated at 12:00 PM.  Appropriate orders placed.  Margreta Journey was informed that the remainder of the evaluation will be completed by another provider, this initial triage assessment does not replace that evaluation, and the importance of remaining in the ED until their evaluation is complete.     Wilnette Kales, [place] 09/04/22 1202

## 2022-09-04 NOTE — ED Provider Notes (Addendum)
[facility] EMERGENCY DEPARTMENT Provider Note   CSN: [id number] Arrival date & time: 09/04/22  1125     History  Chief Complaint  Patient presents with   Near Syncope   Palpitations    Toni Mcmillan is a 88 y.o. female.  Patient is an 88 year old female with a history of diabetes, hypertension, paroxysmal atrial fibrillation currently on Eliquis who in August discontinued her ramipril and metoprolol presenting today with near syncope.  She reports this is now the fourth or fifth episode this month which prior to that she would have episodes on a rare occasion.  Today she was standing in her kitchen when she suddenly started feeling like her vision had sparks in it and she felt like she needed to sit down very quickly.  She did not fall today and she was able to get to her chair and call her neighbor.  Her's family member reports that she is on video and he saw her start breathing heavy and bend over her counter.  She continued to breathe heavy but did not syncopized today.  Last week she did syncopized and he reports she fell straight down smacking her face on the ground.  Patient is currently on Eliquis and takes it regularly.  She was worked up at the beginning of October for this and recommended admission at that time however she decided to leave AMA and followed up with Dr. Allyson Sabal this month and was referred to EP which she has an appointment with later this month.  Patient reports she otherwise has been in her normal state of health.  Her family member reported that they were out shopping yesterday and she was her normal self.  She has been eating and drinking regularly.  When seeing Dr. Allyson Sabal she was found to have tachybradycardia syndrome with A-fib with both fast and slow ventricular response as well as SVT with heart rates up in the 200 range.  A 2D echo performed 07/08/2022 was essentially normal with grade 1 diastolic dysfunction and mild to moderate TR  The  history is provided by the patient.  Near Syncope  Palpitations Associated symptoms: near-syncope        Home Medications Prior to Admission medications   Medication Sig Start Date End Date Taking? Authorizing Provider  apixaban (ELIQUIS) 2.5 MG TABS tablet Take 1 tablet (2.5 mg total) by mouth 2 (two) times daily. 03/08/22   Fenton, Clint R, PA  aspirin-acetaminophen-caffeine (EXCEDRIN MIGRAINE) [phone] MG tablet Take 2 tablets by mouth as needed for headache. Pt takes 250-65 mg as needed.    [provider]  Cholecalciferol (VITAMIN D-3) 25 MCG (1000 UT) CAPS Take 1,000 Units by mouth daily.    [provider]  donepezil (ARICEPT) 5 MG tablet Take 1 tablet (5 mg total) by mouth at bedtime. 08/25/22   Plotnikov, Georgina Quint, MD  Multiple Vitamins-Minerals (ONE-A-DAY WOMENS 50+) TABS Take 1 tablet by mouth daily with breakfast.    [provider]  Omega-3 Fatty Acids (FISH OIL PO) Take 1,000 mg by mouth every morning.    [provider]  pantoprazole (PROTONIX) 40 MG tablet Take 1 tablet (40 mg total) by mouth daily. 12/11/19   Plotnikov, Georgina Quint, MD  Polyvinyl Alcohol-Povidone (REFRESH OP) Place 1-2 drops into both eyes daily as needed (dry eyes).    [provider]  senna-docusate (SENOKOT-S) 8.6-50 MG tablet Take 2 tablets by mouth 2 (two) times daily. 11/25/21 11/25/22  Plotnikov, Georgina Quint, MD  simvastatin (ZOCOR) 40 MG tablet Take 1 tablet (40 mg total) by mouth daily at 6 PM. 04/02/22   Plotnikov, Evie Lacks, MD  vitamin C (ASCORBIC ACID) 500 MG tablet Take 500 mg by mouth daily.    [provider]  Wheat Dextrin (BENEFIBER PO) Take by mouth See admin instructions. Mix 1 teaspoonful of powder into 4-8 ounces of water and drink once a day as needed for mild constipation    [provider]      Allergies    Celecoxib, Ciprofloxacin, Cleocin [clindamycin], Clindamycin hcl, Erythromycin, Gabapentin, Meperidine, Meperidine hcl,  Morphine, Morphine sulfate, Tetracycline, Triprolidine-pseudoephedrine, Zolpidem, and Latex    Review of Systems   Review of Systems  Cardiovascular:  Positive for palpitations and near-syncope.    Physical Exam Updated Vital Signs BP 127/68   Pulse 61   Temp 97.9 F (36.6 C)   Resp 14   Ht 5\' 2"  (1.575 m)   Wt 46 kg   SpO2 99%   BMI 18.55 kg/m  Physical Exam Vitals and nursing note reviewed.  Constitutional:      General: She is not in acute distress.    Appearance: She is well-developed.  HENT:     Head: Normocephalic and atraumatic.  Eyes:     Pupils: Pupils are equal, round, and reactive to light.  Cardiovascular:     Rate and Rhythm: Regular rhythm. Bradycardia present.     Heart sounds: Normal heart sounds. No murmur heard.    No friction rub.  Pulmonary:     Effort: Pulmonary effort is normal.     Breath sounds: Normal breath sounds. No wheezing or rales.  Abdominal:     General: Bowel sounds are normal. There is no distension.     Palpations: Abdomen is soft.     Tenderness: There is no abdominal tenderness. There is no guarding or rebound.  Musculoskeletal:        General: No tenderness. Normal range of motion.     Comments: No edema  Skin:    General: Skin is warm and dry.     Findings: No rash.  Neurological:     Mental Status: She is alert and oriented to person, place, and time. Mental status is at baseline.     Cranial Nerves: No cranial nerve deficit.  Psychiatric:        Mood and Affect: Mood normal.        Behavior: Behavior normal.     ED Results / Procedures / Treatments   Labs (all labs ordered are listed, but only abnormal results are displayed) Labs Reviewed  BASIC METABOLIC PANEL - Abnormal; Notable for the following components:      Result Value   Glucose, Bld 138 (*)    All other components within normal limits  CBC - Abnormal; Notable for the following components:   RBC 3.81 (*)    Hemoglobin 11.3 (*)    All other components  within normal limits  URINALYSIS, ROUTINE W REFLEX MICROSCOPIC - Abnormal; Notable for the following components:   Ketones, ur 15 (*)    Leukocytes,Ua MODERATE (*)    All other components within normal limits  URINALYSIS, MICROSCOPIC (REFLEX) - Abnormal; Notable for the following components:   Bacteria, UA RARE (*)    All other components within normal limits  CBG MONITORING, ED - Abnormal; Notable for the following components:   Glucose-Capillary 127 (*)    All other components within normal limits  TROPONIN I (HIGH SENSITIVITY) -  Abnormal; Notable for the following components:   Troponin I (High Sensitivity) 47 (*)    All other components within normal limits  TROPONIN I (HIGH SENSITIVITY)    EKG EKG Interpretation  Date/Time:  Saturday September 04 2022 11:27:57 EDT Ventricular Rate:  77 PR Interval:  182 QRS Duration: 84 QT Interval:  374 QTC Calculation: 423 R Axis:   -24 Text Interpretation: Normal sinus rhythm Septal infarct , age undetermined When compared with ECG of 16-Aug-2022 14:30, PREVIOUS ECG IS PRESENT Confirmed by Gwyneth Sprout ([id number]) on 09/04/2022 6:47:43 PM  Radiology DG Chest 1 View  Result Date: 09/04/2022 CLINICAL DATA:  Chest pain EXAM: CHEST  1 VIEW COMPARISON:  07/03/2022 FINDINGS: The heart size and mediastinal contours are within normal limits. Aortic atherosclerosis. No focal airspace consolidation, pleural effusion, or pneumothorax. The visualized skeletal structures are unremarkable. IMPRESSION: No active disease. Electronically Signed   By: Duanne Guess D.O.   On: 09/04/2022 12:25    Procedures Procedures    Medications Ordered in ED Medications - No data to display  ED Course/ Medical Decision Making/ A&P                           Medical Decision Making Amount and/or Complexity of Data Reviewed Independent Historian:     Details: family External Data Reviewed: notes.    Details: cardiology Labs: ordered. Decision-making  details documented in ED Course. Radiology: ordered and independent interpretation performed. Decision-making details documented in ED Course. ECG/medicine tests: ordered and independent interpretation performed. Decision-making details documented in ED Course.  Risk Decision regarding hospitalization.   Pt with multiple medical problems and comorbidities and presenting today with a complaint that caries a high risk for morbidity and mortality.  Here today with recurrent near syncope.  When EMS arrived today patient's heart rate was 160 and in A-fib RVR.  She was given 10 mg of Cardizem and converted to sinus rhythm.  She is waited in the waiting room approximately 6 hours but has not had any recurrent events.  Heart rate has been in the 60s.  EKG shows a sinus rhythm without acute changes.  Patient this month alone has had 4 events and was seen in the emergency room earlier this month.  She followed up with her cardiologist as she left the hospital AMA and at that time was noted to have tachybradycardia syndrome with A-fib and was referred to EP.  She did discontinue ramipril and metoprolol in August unclear if that could be adding into why she is having more frequent episodes.  Also on her event monitor she was having SVT.  Patient currently is awake alert in asymptomatic however given the multiple episodes this month with very little warning discussed this with she and her family and they feel that she would benefit from admission to the hospital and expedited cardiology evaluation.  I tend to agree with them as patient is on anticoagulation and has a higher risk if she has another syncopal event and hits her head.  I independently interpreted her EKG and labs today.  EKG without acute changes in sinus rhythm, CBC, BMP and initial troponin are all within normal limits.  Delta troponin was elevated at 47.  Could be related to the rate.  UA without classic findings concerning for UTI and patient is  asymptomatic.  I have independently visualized and interpreted pt's images today.  Chest x-ray within normal limits.  Consulted cardiology for further recommendations  and admission.  7:24 PM Cardiology did evaluate the patient but they are not sure that all of patient's symptoms are related to dysrhythmia and are concerned that some of her syncope may be from another cause.  They want to consult and follow the patient but feel that she should be admitted by the hospitalist service.         Final Clinical Impression(s) / ED Diagnoses Final diagnoses:  Near syncope  Atrial fibrillation with RVR [facility])    Rx / DC Orders ED Discharge Orders     None         Gwyneth Sprout, MD 09/04/22 Kermit Balo, MD 09/04/22 1925

## 2022-09-04 NOTE — Consult Note (Signed)
Cardiology Consultation   Patient ID: Toni Mcmillan MRN: [id number]; DOB: 1931/11/06  Admit date: 09/04/2022 Date of Consult: 09/04/2022  PCP:  Cassandria Anger, MD   [facility] Cardiologist:  Quay Burow, MD        Patient Profile:   Toni Mcmillan is a 88 y.o. female with a hx of HTN, HLD, paroxysmal atrial fibrillation on eliquis and hx of syncope and collapse who is being seen 09/04/2022 for the evaluation of Afib/sinus bradycardia at the request of Dr.Plunkett.  History of Present Illness:   Toni Mcmillan is seen in the room with her daughter at bedside. She states she had one episode of lightheadedness, vision change and felt she was about to pass out this morning while preparing her breakfast. She says she was able to sit down and call her neighbor  Her daughter says the patient had another 3 episodes of unwitnessed "pass out" in the last 10 days. They all occurred in the morning while she was standing and preparing her breakfast. Patient could not recall any detail. Denies fever, chills, cough,   chest discomfort, heart palpitations, dyspnea, nausea, diaphoresis, vomiting, abdominal fullness, dysuria, diarrhea, pedal edema or any bleeding events. Her daughter say the family members have been watching her closely via webcam/video. Unfortunately, the webcam did not work well recently and did not record those episodes. She says the patient had another episode of syncope in August which was recorded on the camera, and appears she was standing and preparing her breakfast and suddenly she passed out and fell backward. Again, the patient does not recall any detail. Her ramipril and metoprolol were discontinued in August.  Patient initially presented with A Fib with RVR (160) and converted to sinus rhythm with 10mg  IV diltiazem, and now in sinus rhythm (50-60bpm). She is on room air, no acute distress.  Of note, patient was seen by Dr. Gwenlyn Found recently. An  event monitor showed A Fib with fast and slow VR and episodes of SVT with HR up to 200 range. No pauses or high grade AV Block. Patient was referred to EP for further evaluation.   Past Medical History:  Diagnosis Date   A-fib ([place])    Allergic rhinitis, cause unspecified    Diabetes mellitus without complication ([place])    borderline"never on meds"   Disturbances of sensation of smell and taste    Dizziness    Headache(784.0)    has decreased to none in later years   Insomnia, unspecified    Other and unspecified hyperlipidemia    Personal history of other diseases of digestive system    Scarlet fever    Unspecified essential hypertension    Metoprolol decreased due to drop in BP readings.   Unspecified sinusitis (chronic)    Whooping cough, unspecified organism     Past Surgical History:  Procedure Laterality Date   ABDOMINAL HYSTERECTOMY     ANAL RECTAL MANOMETRY N/A 07/07/2015   Procedure: ANO RECTAL MANOMETRY;  Surgeon: Garlan Fair, MD;  Location: [facility] ENDOSCOPY;  Service: Endoscopy;  Laterality: N/A;   BILATERAL OOPHORECTOMY     CATARACT EXTRACTION, BILATERAL     COLONOSCOPY W/ POLYPECTOMY     COLONOSCOPY WITH PROPOFOL N/A 05/29/2015   Procedure: COLONOSCOPY WITH PROPOFOL;  Surgeon: Garlan Fair, MD;  Location: [facility] ENDOSCOPY;  Service: Endoscopy;  Laterality: N/A;   correction of hammertoe deformity     CYSTOSCOPY     w/ stenting of both ureters  HEMORRHOID SURGERY     hydrosalpinx diagnostic laparoscopy     TONSILLECTOMY  age 58   vericose vein surgery         Inpatient Medications: Scheduled Meds:  apixaban  2.5 mg Oral BID   donepezil  5 mg Oral QHS   insulin aspart  0-5 Units Subcutaneous QHS   [START ON 09/05/2022] insulin aspart  0-6 Units Subcutaneous TID WC   simvastatin  40 mg Oral q1800   sodium chloride flush  3 mL Intravenous Q12H   sodium chloride flush  3 mL Intravenous Q12H   Continuous Infusions:  sodium chloride     PRN Meds: sodium  chloride, acetaminophen **OR** acetaminophen, senna-docusate, sodium chloride flush  Allergies:    Allergies  Allergen Reactions   Celecoxib Other (See Comments)    Reaction not recalled    Ciprofloxacin Swelling and Other (See Comments)    Caused wrist swelling   Cleocin [Clindamycin] Itching   Clindamycin Hcl Itching   Erythromycin Other (See Comments)    GI upset   Gabapentin Other (See Comments)    Made the patient feel not well the next day   Meperidine Itching   Meperidine Hcl Itching   Morphine Itching   Morphine Sulfate Itching   Tetracycline Other (See Comments)    GI upset   Triprolidine-Pseudoephedrine Other (See Comments)    Reaction not recalled   Zolpidem Other (See Comments)    "Did not have a good sleep"   Latex Itching and Rash    Social History:   Social History   Socioeconomic History   Marital status: Married    Spouse name: Not on file   Number of children: Not on file   Years of education: Not on file   Highest education level: Not on file  Occupational History   Not on file  Tobacco Use   Smoking status: Never   Smokeless tobacco: Never   Tobacco comments:    Never smoke 03/08/22  Substance and Sexual Activity   Alcohol use: No    Alcohol/week: 0.0 standard drinks of alcohol   Drug use: No   Sexual activity: Not on file  Other Topics Concern   Not on file  Social History Narrative   7 grand children, 4 great-grandchildren   Mostly homemaker, 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, 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   Death of step- grandson by suicide- 9-10   Social Determinants of Health   Financial Resource Strain: Low Risk  (08/25/2022)   Overall Financial Resource Strain (CARDIA)    Difficulty of Paying Living Expenses: Not hard at all  Food Insecurity: No Food Insecurity (08/25/2022)   Hunger Vital Sign    Worried About Running Out of Food in the Last Year: Never true    Ran Out of Food in the Last Year: Never true  Transportation Needs: No  Transportation Needs (08/25/2022)   PRAPARE - Administrator, Civil Service (Medical): No    Lack of Transportation (Non-Medical): No  Physical Activity: Sufficiently Active (08/25/2022)   Exercise Vital Sign    Days of Exercise per Week: 5 days    Minutes of Exercise per Session: 30 min  Stress: No Stress Concern Present (08/25/2022)   [employer] of Occupational Health - Occupational Stress Questionnaire    Feeling of Stress : Not at all  Social Connections: Moderately Integrated (08/25/2022)   Social Connection and Isolation Panel [NHANES]    Frequency of Communication with Friends and Family:  More than three times a week    Frequency of Social Gatherings with Friends and Family: More than three times a week    Attends Religious Services: More than 4 times per year    Active Member of Clubs or Organizations: Yes    Attends Banker Meetings: More than 4 times per year    Marital Status: Widowed  Intimate Partner Violence: Not At Risk (08/25/2022)   Humiliation, Afraid, Rape, and Kick questionnaire    Fear of Current or Ex-Partner: No    Emotionally Abused: No    Physically Abused: No    Sexually Abused: No    Family History:    Family History  Problem Relation Age of Onset   Cancer Mother        ?   Diabetes Mother    Other Mother        coagulopathy/ ruptured viscus   Hypertension Father    Hyperlipidemia Father        ?   Other Father        CVA   Cancer Paternal Grandmother        breast     ROS:  Please see the history of present illness.   All other ROS reviewed and negative.     Physical Exam/Data:   Vitals:   09/04/22 1800 09/04/22 1815 09/04/22 1830 09/04/22 1954  BP: 124/77 127/68 135/63   Pulse: 60 61 64   Resp: 19 14 14    Temp:    98 F (36.7 C)  TempSrc:    Oral  SpO2: 98% 99% 98%   Weight:      Height:       No intake or output data in the 24 hours ending 09/04/22 2018    09/04/2022    5:44 PM 08/25/2022     4:17 PM 08/25/2022    2:31 PM  Last 3 Weights  Weight (lbs) 101 lb 6.6 oz 103 lb 6.3 oz 103 lb 6.4 oz  Weight (kg) 46 kg 46.9 kg 46.902 kg     Body mass index is 18.55 kg/m. General:  Well nourished, well developed, in no acute distress HEENT: healed scar occipital scalp Neck: no JVD Vascular: No carotid bruits; Distal pulses 2+ bilaterally Cardiac:  normal S1, S2; RRR; no murmur  Lungs:  clear to auscultation bilaterally, no wheezing, rhonchi or rales  Abd: soft, nontender, no hepatomegaly  Ext: no edema Musculoskeletal:  No deformities, BUE and BLE strength normal and equal Skin: warm and dry  Neuro:  CNs 2-12 intact, no focal abnormalities noted Psych:  Normal affect   Relevant CV Studies:   Laboratory Data:  High Sensitivity Troponin:   Recent Labs  Lab 08/16/22 1435 08/16/22 1919 09/04/22 1139 09/04/22 1633  TROPONINIHS 10 17 11  47*     Chemistry Recent Labs  Lab 09/04/22 1139  NA 141  K 3.9  CL 108  CO2 24  GLUCOSE 138*  BUN 16  CREATININE 0.89  CALCIUM 9.2  GFRNONAA >60  ANIONGAP 9    No results for input(s): "PROT", "ALBUMIN", "AST", "ALT", "ALKPHOS", "BILITOT" in the last 168 hours. Lipids No results for input(s): "CHOL", "TRIG", "HDL", "LABVLDL", "LDLCALC", "CHOLHDL" in the last 168 hours.  Hematology Recent Labs  Lab 09/04/22 1139  WBC 8.2  RBC 3.81*  HGB 11.3*  HCT 36.3  MCV 95.3  MCH 29.7  MCHC 31.1  RDW 12.6  PLT 336   Thyroid No results for input(s): "TSH", "FREET4" in  the last 168 hours.  BNPNo results for input(s): "BNP", "PROBNP" in the last 168 hours.  DDimer No results for input(s): "DDIMER" in the last 168 hours.   Radiology/Studies:  DG Chest 1 View  Result Date: 09/04/2022 CLINICAL DATA:  Chest pain EXAM: CHEST  1 VIEW COMPARISON:  07/03/2022 FINDINGS: The heart size and mediastinal contours are within normal limits. Aortic atherosclerosis. No focal airspace consolidation, pleural effusion, or pneumothorax. The  visualized skeletal structures are unremarkable. IMPRESSION: No active disease. Electronically Signed   By: Duanne Guess D.O.   On: 09/04/2022 12:25     Assessment and Plan:   #Syncope and near syncope -some of her syncopal event appears to be vasovagal or related to positional change -her recent ~7-day monitor showed showed A Fib with fast and slow VR and episodes of SVT with HR up to 200 range. Frequent PVC. No pauses or high grade AV Block.  -patient presented with Afib with RVR and now in sinus rhythm (50-60bpm). It appears patient has tachy-brady syndrome. -Patient will benefit from a pacemaker given tachy-brady syndrome and rate control for her Afib with RVR. However, given some of her syncopal events likely 2/2 vasovagal or position change, a pacemaker will not completely resolve her episodes. We will have our EP service see the patient  as well.   Risk Assessment/Risk Scores:     CHA2DS2-VASc Score = 6   This indicates a 9.7% annual risk of stroke. The patient's score is based upon: CHF History: 0 HTN History: 1 Diabetes History: 1 Stroke History: 0 Vascular Disease History: 1 Age Score: 2 Gender Score: 1     For questions or updates, please contact [facility] Please consult [email or web address] for contact info under    Signed, Filiberto Pinks, MD  09/04/2022 8:18 PM

## 2022-09-05 DIAGNOSIS — R55 Syncope and collapse: Secondary | ICD-10-CM | POA: Diagnosis not present

## 2022-09-05 LAB — BASIC METABOLIC PANEL
Anion gap: 7 (ref 5–15)
BUN: 16 mg/dL (ref 8–23)
CO2: 24 mmol/L (ref 22–32)
Calcium: 9.3 mg/dL (ref 8.9–10.3)
Chloride: 109 mmol/L (ref 98–111)
Creatinine, Ser: 0.68 mg/dL (ref 0.44–1.00)
GFR, Estimated: >60 mL/min (ref >60)
Glucose, Bld: 95 mg/dL (ref 70–99)
Potassium: 4.2 mmol/L (ref 3.5–5.1)
Sodium: 140 mmol/L (ref 135–145)

## 2022-09-05 LAB — CBC
HCT: 31.6 % — ABNORMAL LOW (ref 36.0–46.0)
Hemoglobin: 10.2 g/dL — ABNORMAL LOW (ref 12.0–15.0)
MCH: 30.4 pg (ref 26.0–34.0)
MCHC: 32.3 g/dL (ref 30.0–36.0)
MCV: 94 fL (ref 80.0–100.0)
Platelets: 285 10*3/uL (ref 150–400)
RBC: 3.36 MIL/uL — ABNORMAL LOW (ref 3.87–5.11)
RDW: 12.5 % (ref 11.5–15.5)
WBC: 8.8 10*3/uL (ref 4.0–10.5)
nRBC: 0 % (ref 0.0–0.2)

## 2022-09-05 LAB — HEMOGLOBIN A1C
Hgb A1c MFr Bld: 5.6 % (ref 4.8–5.6)
Mean Plasma Glucose: 114.02 mg/dL

## 2022-09-05 LAB — TROPONIN I (HIGH SENSITIVITY): Troponin I (High Sensitivity): 47 ng/L — ABNORMAL HIGH (ref <18)

## 2022-09-05 LAB — CBG MONITORING, ED
Glucose-Capillary: 103 mg/dL — ABNORMAL HIGH (ref 70–99)
Glucose-Capillary: 121 mg/dL — ABNORMAL HIGH (ref 70–99)

## 2022-09-05 LAB — MAGNESIUM: Magnesium: 2.1 mg/dL (ref 1.7–2.4)

## 2022-09-05 MED ORDER — AMIODARONE HCL 200 MG PO TABS: ORAL_TABLET | ORAL | 0 refills | Status: DC

## 2022-09-05 MED ORDER — AMIODARONE HCL 200 MG PO TABS: 200.0000 mg | ORAL_TABLET | Freq: Once | ORAL | Status: DC

## 2022-09-05 MED ORDER — DICLOFENAC SODIUM 1 % EX GEL
2.0000 g | Freq: Four times a day (QID) | CUTANEOUS | Status: DC
  Filled 2022-09-05: qty 100

## 2022-09-05 MED ORDER — PANTOPRAZOLE SODIUM 40 MG PO TBEC: 40.0000 mg | DELAYED_RELEASE_TABLET | Freq: Every day | ORAL | 1 refills | Status: DC

## 2022-09-05 NOTE — Discharge Summary (Signed)
Physician Discharge Summary  Toni Mcmillan YWV:[id number] DOB: [birth year] DOA: 09/04/2022  PCP: Tresa Garter, MD  Admit date: 09/04/2022 Discharge date: 09/05/2022  Time spent: 40 minutes  Recommendations for Outpatient Follow-up:  Follow outpatient CBC/CMP  Follow cardiology outpatient   Discharge Diagnoses:  Principal Problem:   Near syncope Active Problems:   Type 2 diabetes, controlled, with neuropathy (HCC)   Essential hypertension   Memory change   PAF (paroxysmal atrial fibrillation) (HCC)   Elevated troponin   Discharge Condition: stable  Diet recommendation: heart healthy, diabetic  Filed Weights   09/04/22 1744  Weight: 46 kg    History of present illness:  Toni Mcmillan is Toni Mcmillan pleasant 88 y.o. female with medical history significant for diet-controlled diabetes mellitus, hypertension, memory loss, and paroxysmal atrial fibrillation on Eliquis who presents to the emergency department after an episode of lightheadedness with near syncope.  She was seen by cardiology who made recommendations with regards to her afib and recommended outpatient EP follow up.  Plan for amiodarone.  Question of orthostasis, instructed to change positions slowly.  Stable for discharge, see below for additional details.  Hospital Course:  Assessment and Plan: 1. Near-syncope  - Presents after [place] near-syncopal episode in setting of rapid Briseyda Fehr fib with rate 160s; she reports 4-5 similar episodes this month  - She converted to SR prior to arrival in ED and is now asymptomatic  - Recent ambulatory heart monitoring revealed tachycardia-bradycardia syndrome, AF with both rapid and slow ventricular response, and SVT with rates up to 200-range - Appreciate cardiology consultation, recommending amiodarone and follow up with EP clinic - cards suspects possibly orthostatic, recommending change position slowly, if additional episodes will need ILR implant   2. PAF with RVR  - She was in  Bertrand Vowels fib with rate 160s with EMS, was treated with 10 mg IV diltiazem, and converted to SR prior to arrival  - Continue cardiac monitoring, continue Eliquis (2.5 mg BID given age and weight)  - amiodarone started per cardiology   3. Elevated troponin  - Troponin was 11 and then 47 -> repeat flat - suspect demand related to fib - No chest pain or acute ischemic features on EKG, likely secondary to rapid Embry Manrique fib  - follow outpatient with cardiology    4. Hypertension  - Antihypertensives stopped in August 2023    5. Type II DM  - A1c was 6.6% in March 2023  - Diet-controlled at home    6. Memory loss  - Continue Aricept, use delirium precautions  - follow outpatient    Procedures: none   Consultations: cardiology  Discharge Exam: Vitals:   09/05/22 0900 09/05/22 1246  BP: (!) 139/92   Pulse: 63   Resp: 12   Temp:  (!) 97.4 F (36.3 C)  SpO2: 98%    Feels ok, no new complaints Discussed d/c plans with daughter  General: No acute distress. Cardiovascular: Heart sounds show [place] regular rate, and rhythm.  Lungs: unlabored Abdomen: Soft, nontender, nondistended Neurological: Alert and oriented 3. Moves all extremities 4 with equal strength. Cranial nerves II through XII grossly intact. Extremities: No clubbing or cyanosis. No edema.  Discharge Instructions   Discharge Instructions     Call MD for:  difficulty breathing, headache or visual disturbances   Complete by: As directed    Call MD for:  extreme fatigue   Complete by: As directed    Call MD for:  hives   Complete by: As directed  Call MD for:  persistant dizziness or light-headedness   Complete by: As directed    Call MD for:  persistant nausea and vomiting   Complete by: As directed    Call MD for:  redness, tenderness, or signs of infection (pain, swelling, redness, odor or green/yellow discharge around incision site)   Complete by: As directed    Call MD for:  severe uncontrolled pain   Complete by: As  directed    Call MD for:  temperature >100.4   Complete by: As directed    Diet - low sodium heart healthy   Complete by: As directed    Discharge instructions   Complete by: As directed    You were seen for presyncope (near fainting).  You were seen by the cardiologists for concern that this maybe related to your atrial fibrillation.  We're going to start you on amiodarone.  There's concern that your symptoms may be related to positional changes.  Change positions slowly.  We'll have you follow with cardiology as an outpatient.  Return for new, recurrent, or worsening symptoms.  Please ask your PCP to request records from this hospitalization so they know what was done and what the next steps will be.   Increase activity slowly   Complete by: As directed       Allergies as of 09/05/2022       Reactions   Celecoxib Other (See Comments)   Reaction not recalled    Ciprofloxacin Swelling, Other (See Comments)   Caused wrist swelling   Cleocin [clindamycin] Itching   Clindamycin Hcl Itching   Erythromycin Other (See Comments)   GI upset   Gabapentin Other (See Comments)   Made the patient feel not well the next day   Meperidine Itching   Meperidine Hcl Itching   Morphine Itching   Morphine Sulfate Itching   Tetracycline Other (See Comments)   GI upset   Triprolidine-pseudoephedrine Other (See Comments)   Reaction not recalled   Zolpidem Other (See Comments)   "Did not have Xyla Leisner good sleep"   Latex Itching, Rash        Medication List     TAKE these medications    amiodarone 200 MG tablet Commonly known as: PACERONE Take 1 tablet (200 mg total) by mouth 2 (two) times daily for 30 days, THEN 1 tablet (200 mg total) daily. Start taking on: September 05, 2022   apixaban 2.5 MG Tabs tablet Commonly known as: Eliquis Take 1 tablet (2.5 mg total) by mouth 2 (two) times daily.   ascorbic acid 500 MG tablet Commonly known as: VITAMIN C Take 500 mg by mouth daily.    aspirin-acetaminophen-caffeine 250-250-65 MG tablet Commonly known as: EXCEDRIN MIGRAINE Take 2 tablets by mouth as needed for headache. Pt takes 250-65 mg as needed.   BENEFIBER PO Take by mouth See admin instructions. Mix 1 teaspoonful of powder into 4-8 ounces of water and drink once Juston Goheen day as needed for mild constipation   donepezil 5 MG tablet Commonly known as: ARICEPT Take 1 tablet (5 mg total) by mouth at bedtime.   FISH OIL PO Take 1,000 mg by mouth every morning.   One-Piper [place]-Day Womens 50+ Tabs Take 1 tablet by mouth daily with breakfast.   pantoprazole 40 MG tablet Commonly known as: PROTONIX Take 1 tablet (40 mg total) by mouth daily.   REFRESH OP Place 1-2 drops into both eyes daily as needed (dry eyes).   senna-docusate 8.6-50 MG tablet Commonly known as:  Senokot-S Take 2 tablets by mouth 2 (two) times daily.   simvastatin 40 MG tablet Commonly known as: ZOCOR Take 1 tablet (40 mg total) by mouth daily at 6 PM.   Vitamin D-3 25 MCG (1000 UT) Caps Take 1,000 Units by mouth daily.       Allergies  Allergen Reactions   Celecoxib Other (See Comments)    Reaction not recalled    Ciprofloxacin Swelling and Other (See Comments)    Caused wrist swelling   Cleocin [Clindamycin] Itching   Clindamycin Hcl Itching   Erythromycin Other (See Comments)    GI upset   Gabapentin Other (See Comments)    Made the patient feel not well the next day   Meperidine Itching   Meperidine Hcl Itching   Morphine Itching   Morphine Sulfate Itching   Tetracycline Other (See Comments)    GI upset   Triprolidine-Pseudoephedrine Other (See Comments)    Reaction not recalled   Zolpidem Other (See Comments)    "Did not have Tareva Leske good sleep"   Latex Itching and Rash    Follow-up Information     Plotnikov, Georgina Quint, MD Follow up.   Specialty: Internal Medicine Why: F/u post hospitalization Contact information: [address] [place] [address] [phone]          [facility] Follow up.   Contact information: [address]  [phone]        [facility] [employer] Follow up.   Why: Offer exercize classes arts, crafts, book club Contact information: [address] [phone]                 The results of significant diagnostics from this hospitalization (including imaging, microbiology, ancillary and laboratory) are listed below for reference.    Significant Diagnostic Studies: DG Chest 1 View  Result Date: 09/04/2022 CLINICAL DATA:  Chest pain EXAM: CHEST  1 VIEW COMPARISON:  07/03/2022 FINDINGS: The heart size and mediastinal contours are within normal limits. Aortic atherosclerosis. No focal airspace consolidation, pleural effusion, or pneumothorax. The visualized skeletal structures are unremarkable. IMPRESSION: No active disease. Electronically Signed   By: Duanne Guess D.O.   On: 09/04/2022 12:25   CT HEAD WO CONTRAST ( )  Result Date: 08/25/2022 CLINICAL DATA:  Syncope and collapse with loss consciousness stroke head EXAM: CT HEAD WITHOUT CONTRAST TECHNIQUE: Contiguous axial images were obtained from the base of the skull through the vertex without intravenous contrast. RADIATION DOSE REDUCTION: This exam was performed according to the departmental dose-optimization program which includes automated exposure control, adjustment of the mA and/or kV according to patient size and/or use of iterative reconstruction technique. COMPARISON:  08/16/2022 FINDINGS: Brain: No evidence of acute infarction, hemorrhage, mass, mass effect, or midline shift. No hydrocephalus or extra-axial fluid collection. Cerebral volume is within normal limits for age. Vascular: No hyperdense vessel. Skull: Negative for fracture or focal lesion. Small right paramedian occipital scalp hematoma. Sinuses/Orbits: No acute finding. Status post bilateral lens replacements. Other: The mastoid  air cells are well aerated. IMPRESSION: No acute intracranial process. Electronically Signed   By: Wiliam Ke M.D.   On: 08/25/2022 16:16   CT Head Wo Contrast  Result Date: 08/16/2022 CLINICAL DATA:  Sudden onset severe headache EXAM: CT HEAD WITHOUT CONTRAST TECHNIQUE: Contiguous axial images were obtained from the base of the skull through the vertex without intravenous contrast. RADIATION DOSE REDUCTION: This exam was performed according to the departmental dose-optimization  program which includes automated exposure control, adjustment of the mA and/or kV according to patient size and/or use of iterative reconstruction technique. COMPARISON:  Head CT 07/03/2022 FINDINGS: Brain: There is no acute intracranial hemorrhage, extra-axial fluid collection, or acute infarct Parenchymal volume is normal. The ventricles are normal in size. Gray-white differentiation is preserved. There is no mass lesion.  There is no mass effect or midline shift. Vascular: There is calcification of the bilateral carotid siphons. Skull: Normal. Negative for fracture or focal lesion. Sinuses/Orbits: The imaged paranasal sinuses are clear. Bilateral lens implants are in place. The globes and orbits are otherwise unremarkable. Other: None. IMPRESSION: Stable noncontrast head CT with no acute intracranial pathology. Electronically Signed   By: Valetta Mole M.D.   On: 08/16/2022 15:25    Microbiology: No results found for this or any previous visit (from the past 240 hour(s)).   Labs: Basic Metabolic Panel: Recent Labs  Lab 09/04/22 1139 09/05/22 0334  NA 141 140  K 3.9 4.2  CL 108 109  CO2 24 24  GLUCOSE 138* 95  BUN 16 16  CREATININE 0.89 0.68  CALCIUM 9.2 9.3  MG  --  2.1   Liver Function Tests: No results for input(s): "AST", "ALT", "ALKPHOS", "BILITOT", "PROT", "ALBUMIN" in the last 168 hours. No results for input(s): "LIPASE", "AMYLASE" in the last 168 hours. No results for input(s): "AMMONIA" in the last  168 hours. CBC: Recent Labs  Lab 09/04/22 1139 09/05/22 0334  WBC 8.2 8.8  HGB 11.3* 10.2*  HCT 36.3 31.6*  MCV 95.3 94.0  PLT 336 285   Cardiac Enzymes: No results for input(s): "CKTOTAL", "CKMB", "CKMBINDEX", "TROPONINI" in the last 168 hours. BNP: BNP (last 3 results) Recent Labs    03/05/22 1921  BNP 119.2*    ProBNP (last 3 results) No results for input(s): "PROBNP" in the last 8760 hours.  CBG: Recent Labs  Lab 09/04/22 1136 09/04/22 2333 09/05/22 0722 09/05/22 1144  GLUCAP 127* 106* 103* 121*       Signed:  Fayrene Helper MD.  [facility] 09/05/2022, 1:12 PM

## 2022-09-05 NOTE — Care Management (Signed)
Met with patient and daughter at bedside. Discussed discharge planning. No DME or HH needs identified. OBS moon letter given. Will DC soon, daughter can transport

## 2022-09-05 NOTE — Care Management Obs Status (Signed)
[place] NOTIFICATION   Patient Details  Name: Toni Mcmillan MRN: [id number] Date of Birth: Jul 15, 1931   Medicare Observation Status Notification Given:       Verdell Carmine, RN 09/05/2022, 1:00 PM

## 2022-09-05 NOTE — Consult Note (Signed)
Cardiology Consultation   Patient ID: Toni MoreGertrude L Donze MRN: [id number]; DOB: 05/24/1931  Admit date: 09/04/2022 Date of Consult: 09/05/2022  PCP:  Tresa GarterPlotnikov, Aleksei V, MD   [facility] Providers Cardiologist:  Nanetta BattyJonathan Berry, MD        Patient Profile:   Toni Mcmillan is a 88 y.o. female with a hx of hypertension, hyperlipidemia, atrial fibrillation, SVT, syncope who is being seen 09/05/2022 for the evaluation of syncope, atrial fibrillation at the request of Lacretia NicksCaldwell Powell.  History of Present Illness:   Toni Mcmillan she has a past history as above.  She presented to the hospital after episode of lightheadedness, visual changes.  She does have a history of multiple episodes of syncope.  She sat down and called her neighbor and felt improved after she sat down.  She has had multiple episodes of syncope.  She wore a cardiac monitor that showed mainly sinus rhythm, but episodes of atrial fibrillation as well as SVT.  Her heart rates averaged in the 60s to 70s in sinus rhythm and she did not appear to have postconversion pauses.  She states that her episodes of syncope occur mainly in the mornings.  She feels that they may be related to standing, though she is not completely clear on this.  When she presented to the emergency room she was in rapid atrial fibrillation but did convert to sinus rhythm with IV Cardizem.  Her heart rates have been in the 50s to 60s in the emergency room.   Past Medical History:  Diagnosis Date   A-fib (HCC)    Allergic rhinitis, cause unspecified    Diabetes mellitus without complication (HCC)    borderline"never on meds"   Disturbances of sensation of smell and taste    Dizziness    Headache(784.0)    has decreased to none in later years   Insomnia, unspecified    Other and unspecified hyperlipidemia    Personal history of other diseases of digestive system    Scarlet fever    Unspecified essential hypertension    Metoprolol decreased  due to drop in BP readings.   Unspecified sinusitis (chronic)    Whooping cough, unspecified organism     Past Surgical History:  Procedure Laterality Date   ABDOMINAL HYSTERECTOMY     ANAL RECTAL MANOMETRY N/A 07/07/2015   Procedure: ANO RECTAL MANOMETRY;  Surgeon: Charolett BumpersMartin K Johnson, MD;  Location: [facility];  Service: Endoscopy;  Laterality: N/A;   BILATERAL OOPHORECTOMY     CATARACT EXTRACTION, BILATERAL     COLONOSCOPY W/ POLYPECTOMY     COLONOSCOPY WITH PROPOFOL N/A 05/29/2015   Procedure: COLONOSCOPY WITH PROPOFOL;  Surgeon: Charolett BumpersMartin K Johnson, MD;  Location: [facility];  Service: Endoscopy;  Laterality: N/A;   correction of hammertoe deformity     CYSTOSCOPY     w/ stenting of both ureters   HEMORRHOID SURGERY     hydrosalpinx diagnostic laparoscopy     TONSILLECTOMY  age 88   vericose vein surgery       Home Medications:  Prior to Admission medications   Medication Sig Start Date End Date Taking? Authorizing Provider  apixaban (ELIQUIS) 2.5 MG TABS tablet Take 1 tablet (2.5 mg total) by mouth 2 (two) times daily. 03/08/22   Fenton, Clint R, PA  aspirin-acetaminophen-caffeine (EXCEDRIN MIGRAINE) [phone] MG tablet Take 2 tablets by mouth as needed for headache. Pt takes 250-65 mg as needed.    [provider]  Cholecalciferol (VITAMIN D-3) 25 MCG (  1000 UT) CAPS Take 1,000 Units by mouth daily.    [provider]  donepezil (ARICEPT) 5 MG tablet Take 1 tablet (5 mg total) by mouth at bedtime. 08/25/22   Plotnikov, Georgina Quint, MD  Multiple Vitamins-Minerals (ONE-A-DAY WOMENS 50+) TABS Take 1 tablet by mouth daily with breakfast.    [provider]  Omega-3 Fatty Acids (FISH OIL PO) Take 1,000 mg by mouth every morning.    [provider]  pantoprazole (PROTONIX) 40 MG tablet Take 1 tablet (40 mg total) by mouth daily. 12/11/19   Plotnikov, Georgina Quint, MD  Polyvinyl Alcohol-Povidone (REFRESH OP) Place 1-2 drops into both eyes daily as needed  (dry eyes).    [provider]  senna-docusate (SENOKOT-S) 8.6-50 MG tablet Take 2 tablets by mouth 2 (two) times daily. 11/25/21 11/25/22  Plotnikov, Georgina Quint, MD  simvastatin (ZOCOR) 40 MG tablet Take 1 tablet (40 mg total) by mouth daily at 6 PM. 04/02/22   Plotnikov, Georgina Quint, MD  vitamin C (ASCORBIC ACID) 500 MG tablet Take 500 mg by mouth daily.    [provider]  Wheat Dextrin (BENEFIBER PO) Take by mouth See admin instructions. Mix 1 teaspoonful of powder into 4-8 ounces of water and drink once a day as needed for mild constipation    [provider]    Inpatient Medications: Scheduled Meds:  apixaban  2.5 mg Oral BID   diclofenac Sodium  2 g Topical QID   donepezil  5 mg Oral QHS   insulin aspart  0-5 Units Subcutaneous QHS   insulin aspart  0-6 Units Subcutaneous TID WC   simvastatin  40 mg Oral q1800   sodium chloride flush  3 mL Intravenous Q12H   sodium chloride flush  3 mL Intravenous Q12H   Continuous Infusions:  sodium chloride     PRN Meds: sodium chloride, acetaminophen **OR** acetaminophen, senna-docusate, sodium chloride flush  Allergies:    Allergies  Allergen Reactions   Celecoxib Other (See Comments)    Reaction not recalled    Ciprofloxacin Swelling and Other (See Comments)    Caused wrist swelling   Cleocin [Clindamycin] Itching   Clindamycin Hcl Itching   Erythromycin Other (See Comments)    GI upset   Gabapentin Other (See Comments)    Made the patient feel not well the next day   Meperidine Itching   Meperidine Hcl Itching   Morphine Itching   Morphine Sulfate Itching   Tetracycline Other (See Comments)    GI upset   Triprolidine-Pseudoephedrine Other (See Comments)    Reaction not recalled   Zolpidem Other (See Comments)    "Did not have a good sleep"   Latex Itching and Rash    Social History:   Social History   Socioeconomic History   Marital status: Married    Spouse name: Not on file   Number of  children: Not on file   Years of education: Not on file   Highest education level: Not on file  Occupational History   Not on file  Tobacco Use   Smoking status: Never   Smokeless tobacco: Never   Tobacco comments:    Never smoke 03/08/22  Substance and Sexual Activity   Alcohol use: No    Alcohol/week: 0.0 standard drinks of alcohol   Drug use: No   Sexual activity: Not on file  Other Topics Concern   Not on file  Social History Narrative   7 grand children, 4 great-grandchildren  Mostly homemaker, 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, 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   Death of step- grandson by suicide- 9-10   Social Determinants of Health   Financial Resource Strain: Low Risk  (08/25/2022)   Overall Financial Resource Strain (CARDIA)    Difficulty of Paying Living Expenses: Not hard at all  Food Insecurity: No Food Insecurity (08/25/2022)   Hunger Vital Sign    Worried About Running Out of Food in the Last Year: Never true    Ran Out of Food in the Last Year: Never true  Transportation Needs: No Transportation Needs (08/25/2022)   PRAPARE - Administrator, Civil Service (Medical): No    Lack of Transportation (Non-Medical): No  Physical Activity: Sufficiently Active (08/25/2022)   Exercise Vital Sign    Days of Exercise per Week: 5 days    Minutes of Exercise per Session: 30 min  Stress: No Stress Concern Present (08/25/2022)   [employer] of Occupational Health - Occupational Stress Questionnaire    Feeling of Stress : Not at all  Social Connections: Moderately Integrated (08/25/2022)   Social Connection and Isolation Panel [NHANES]    Frequency of Communication with Friends and Family: More than three times a week    Frequency of Social Gatherings with Friends and Family: More than three times a week    Attends Religious Services: More than 4 times per year    Active Member of [employer] or Organizations: Yes    Attends Banker Meetings: More than 4  times per year    Marital Status: Widowed  Intimate Partner Violence: Not At Risk (08/25/2022)   Humiliation, Afraid, Rape, and Kick questionnaire    Fear of Current or Ex-Partner: No    Emotionally Abused: No    Physically Abused: No    Sexually Abused: No    Family History:    Family History  Problem Relation Age of Onset   Cancer Mother        ?   Diabetes Mother    Other Mother        coagulopathy/ ruptured viscus   Hypertension Father    Hyperlipidemia Father        ?   Other Father        CVA   Cancer Paternal Grandmother        breast     ROS:  Please see the history of present illness.   All other ROS reviewed and negative.     Physical Exam/Data:   Vitals:   09/05/22 0415 09/05/22 0500 09/05/22 0800 09/05/22 0816  BP: 119/63 114/79 (!) 143/68   Pulse: (!) 55 (!) 53 (!) 54   Resp: 18 (!) 21 19   Temp: 97.7 F (36.5 C)   (!) 97.5 F (36.4 C)  TempSrc: Oral     SpO2: 99% 98% 92%   Weight:      Height:       No intake or output data in the 24 hours ending 09/05/22 1043    09/04/2022    5:44 PM 08/25/2022    4:17 PM 08/25/2022    2:31 PM  Last 3 Weights  Weight (lbs) 101 lb 6.6 oz 103 lb 6.3 oz 103 lb 6.4 oz  Weight (kg) 46 kg 46.9 kg 46.902 kg     Body mass index is 18.55 kg/m.  General:  Well nourished, well developed, in no acute distress HEENT: normal Neck: no JVD Vascular: No carotid bruits; Distal pulses 2+  bilaterally Cardiac:  normal S1, S2; RRR; no murmur  Lungs:  clear to auscultation bilaterally, no wheezing, rhonchi or rales  Abd: soft, nontender, no hepatomegaly  Ext: no edema Musculoskeletal:  No deformities, BUE and BLE strength normal and equal Skin: warm and dry  Neuro:  CNs 2-12 intact, no focal abnormalities noted Psych:  Normal affect   EKG:  The EKG was personally reviewed and demonstrates: Sinus rhythm Telemetry:  Telemetry was personally reviewed and demonstrates: Atrial fibrillation, sinus rhythm  Relevant CV  Studies: TTE 07/08/22  1. Left ventricular ejection fraction, by estimation, is 60 to 65%. The  left ventricle has normal function. The left ventricle has no regional  wall motion abnormalities. Left ventricular diastolic parameters are  consistent with Grade I diastolic  dysfunction (impaired relaxation).   2. Right ventricular systolic function is normal. The right ventricular  size is normal. There is normal pulmonary artery systolic pressure. The  estimated right ventricular systolic pressure is 37.9 mmHg.   3. The mitral valve is normal in structure. Mild mitral valve  regurgitation.   4. Tricuspid valve regurgitation is mild to moderate.   5. The aortic valve is tricuspid. Aortic valve regurgitation is not  visualized. Aortic valve sclerosis/calcification is present, without any  evidence of aortic stenosis.   6. The inferior vena cava is normal in size with greater than 50%  respiratory variability, suggesting right atrial pressure of 3 mmHg.   Laboratory Data:  High Sensitivity Troponin:   Recent Labs  Lab 08/16/22 1435 08/16/22 1919 09/04/22 1139 09/04/22 1633 09/04/22 2341  TROPONINIHS 10 17 11  47* 47*     Chemistry Recent Labs  Lab 09/04/22 1139 09/05/22 0334  NA 141 140  K 3.9 4.2  CL 108 109  CO2 24 24  GLUCOSE 138* 95  BUN 16 16  CREATININE 0.89 0.68  CALCIUM 9.2 9.3  MG  --  2.1  GFRNONAA >60 >60  ANIONGAP 9 7    No results for input(s): "PROT", "ALBUMIN", "AST", "ALT", "ALKPHOS", "BILITOT" in the last 168 hours. Lipids No results for input(s): "CHOL", "TRIG", "HDL", "LABVLDL", "LDLCALC", "CHOLHDL" in the last 168 hours.  Hematology Recent Labs  Lab 09/04/22 1139 09/05/22 0334  WBC 8.2 8.8  RBC 3.81* 3.36*  HGB 11.3* 10.2*  HCT 36.3 31.6*  MCV 95.3 94.0  MCH 29.7 30.4  MCHC 31.1 32.3  RDW 12.6 12.5  PLT 336 285   Thyroid No results for input(s): "TSH", "FREET4" in the last 168 hours.  BNPNo results for input(s): "BNP", "PROBNP" in the  last 168 hours.  DDimer No results for input(s): "DDIMER" in the last 168 hours.   Radiology/Studies:  DG Chest 1 View  Result Date: 09/04/2022 CLINICAL DATA:  Chest pain EXAM: CHEST  1 VIEW COMPARISON:  07/03/2022 FINDINGS: The heart size and mediastinal contours are within normal limits. Aortic atherosclerosis. No focal airspace consolidation, pleural effusion, or pneumothorax. The visualized skeletal structures are unremarkable. IMPRESSION: No active disease. Electronically Signed   By: Davina Poke D.O.   On: 09/04/2022 12:25     Assessment and Plan:   Paroxysmal atrial fibrillation: Has had multiple episodes.  Currently on Eliquis 2.5 mg twice daily.  CHA2DS2-VASc of 3.  She is continue to have episodes of atrial fibrillation that make her feel potentially quite poorly, we Alaisha Eversley start her on amiodarone 200 mg twice daily for 1 month followed by 200 mg daily.  We Toddy Boyd arrange for follow-up in EP clinic for further management.  Syncope: Syncope sounds to be somewhat orthostatic.  When she sat down during her most recent episode of near syncope, she felt improved.  She was found to be tachycardic and was in atrial fibrillation on presentation to the emergency room.  She understands that she needs to stand up more slowly and have a place to sit down.  Okay for discharge now and Benedict Kue have her follow-up in clinic.  If further episodes of syncope occur, she Illyanna Petillo likely need ILR implant.   Risk Assessment/Risk Scores:          CHA2DS2-VASc Score = 6   This indicates a 9.7% annual risk of stroke. The patient's score is based upon: CHF History: 0 HTN History: 1 Diabetes History: 1 Stroke History: 0 Vascular Disease History: 1 Age Score: 2 Gender Score: 1     [facility] Chiquita Heckert sign off.   Medication Recommendations: Amiodarone 200 mg twice daily for 1 month followed by 200 mg daily Other recommendations (labs, testing, etc): None Follow up as an outpatient: To be arranged  in EP clinic  For questions or updates, please contact [facility] Please consult [email or web address] for contact info under    Signed, Kyngston Pickelsimer Jorja Loa, MD  09/05/2022 10:43 AM

## 2022-09-05 NOTE — H&P (Incomplete)
Chief Complaint:   History of Present Illness (HPI) Toni Mcmillan is a 88 year old female with a pmhx of Paroxymal Afib on eliquis, HTN and dementia complaints of syncopal episodes and confirmed by daughter at beside. Patient mentions that syncopal episodes have been ongoing for this past year with them becoming more frequent over the last couple weeks. Patient recently had her metoprolol and ramipril stopped in August due to these syncopal events. Her family has been able to capture some of the syncopal episodes on video but not this recent one on Friday when she was making herself breakfast. The syncopal episodes are accompanied by shortness of breath, dizziness, visual flashing colors and usually occur in the morning with no warning. Patient denies any substernal chest pain, headache, blood loss, dehydration but has had a 20lb weight loss since her husband passed 6 months ago. Upon ED arrival, patient was noted to have Afib RVR with rates in the 160s and converted to SR with 10mg  IV of cardizem. Patient was seen recently by Dr. with cardiology and noted to have Afib with fast and slow ventricular response and SVT with HR exceeding into the 200 range. She was referred to EP and scheduled October 31st, 2023.      Past Medical History (PMH):   Medical Hx: DM Type 2 w/out complication  GERD HTN Paroxsymal Afib on eliquis  Allergic Rhinitis  Dizziness HLD Headache Insomnia  Scarlet Fever Dementia (last 5 years)   Surgical HX  Hysterectomy  BL oophorectomy  Cataract Extraction  Colonoscopy  Cystoscopy Hemorrhoid  Tonsillectomy    Psychiatric Hx:  None   o Over the past 2 weeks, have you felt down, depressed, or hopeless? No   o Over the past 2 weeks have you felt little interest in doing things? No    Allergies:  Celecoxib  Ciprofloxacin-Swelling Cleocin-Itching  Clindamycin Hcl-Itching  Meperidine -Itching  Meperidine Hcl -Itching  Morphine -Itching  Morphine Sulfate -Itching   Zolpidem- Sleep disturbances  Latex -Itching, Rash  Erythromycin -GI upset  Gabapentin-malaise  Tetracycline -GI upset  Triprolidine-pseudoephedrine  Reaction not recalled   Current Medication Hx:  apixaban (ELIQUIS) 2.5 MG, PO, BID   aspirin-acetaminophen-caffeine (EXCEDRIN MIGRAINE) 250-65 MG, PO, PRN  Cholecalciferol (VITAMIN D-3) 25 MCG (1000 UT), PO, Once Daily     donepezil (ARICEPT) 5 MG, PO, Once at Bedtime    Multiple Vitamins-Minerals (ONE-A-DAY WOMENS 50+), PO, 1 tablet with breakfast  Omega-3 Fatty Acids (FISH OIL), PO, 1000mg , Daily/Morning pantoprazole (PROTONIX) 40 MG, Once, Daily   Polyvinyl Alcohol-Povidone (REFRESH OP) -not  senna-docusate (SENOKOT-S) 8.6-50 MG, PO, TID. PRN simvastatin (ZOCOR) 40 MG , PO, Daily  vitamin C (ASCORBIC ACID) 500 MG, PO, Daily    Wheat Dextrin (BENEFIBER) , PO, Daily        Social History:   Tobacco Use: No Alcohol Use: once every month, 2 small beers  Illicit Substance/Drug Use: None Current Household: Living alone  Occupation: Retired-  Recent Travel: No    Family History:   ROS:    General: Negative for fever, chills, or fatigue. Positive for weight loss approximately 20lbs.  HEENT:  Negative for visual changes but positve for dry eye in right eye. Negative for nasal congestion, hearing changes, swallowing issues.    Neuro: Negative for numbness or tingling sensations, sensation loss.   Pulm: Negative for cough, positive for shortness of breath. Negative for wheezing and hemoptysis.    CV: Negative for chest pain, positive for peripheral edema in lower  extremities.    GI/GU: Negative for heartburn, pain with swallowing, abdominal pain, or presence of blood stool. Negative for changes in  urination, frequency with urination, negative for presence of hematuria or blood in urine.    Heme/Onc/:  Negative for abnormal bleeding or bruising.    Endo: Negative for polyuria, polydipsia, polyphagia, heat or cold  intolerances    MSK / Skin: Negative for changes in skin, or presences of rashes or blisters. Negative for swelling, or decrease ROM in other joints of extremities bilaterally. Negative for pain in all joints and extremities except joints at both knees.    Psych: Negative for anxiety and depression. Negative for thoughts of self harm or suicide.    Physical Examination (PE): Constitutional: Cooperative and no apparent distress Eyes: EOM , PERRLA intact.  ENMT: Hearing intact, pink moist, mucous membranes  Neck: supple, trachea middline Respiratory: Clear breath sounds anterior and posteriorly in all lung fields.  Cardiovascular: S1 & S2 heard, regular rate and rhythm. No extremity edema.   Abdomen: No distension, no tenderness, soft. Bowel sounds active.  Musculoskeletal: no clubbing / cyanosis. No joint deformity upper and lower extremities.   Skin: no significant rashes, lesions, ulcers.  Neurologic: CN 2-12 intact. Moving all extremities. Alert and oriented x 4. Psychiatric: Pleasant. Cooperative for situation.      Vital Signs   ~800 Temp 97.5 F  BP143/68 (90) HR 54 RR 19 Pulse Ox O2 92% RA  Labs:  BMET 10/20  Na 141, K 3.9, Cl 108, Co2 24, Glucose 138, BUN 16, Cr 0.89, Cal 9.2, Anion GAP 9, GFR >60  BMET 10/21 Na 140, K 4.2, Cl 109, Co2 24, Glucose 95, BUN 16, Cr 0.68, Cal 9.3, Anion Gap 7, GFR >60  Mag 2.1   Other Imagining and Diagnostics  10/21 UA + Ketones, Moderate Leukocytes  10/21 CXR- No acute abnormalities or cardiopulmonary disease  10/21 ECG NSR, No Ectopy   07/08/22 ECHO LVEF 60-65%  Normal LV function No regional wall abnormalities or Pericardial effusion  Grade 1 Diastolic-impaired relaxation RV function normal Mild Mitral Valve Regurg Mild-Moderate Tricuspid Regurg   Assessment and Plan   Syncopal Episodes  Ddx: Arrhthymias (Afib RVR), heart failure, stroke, anemia, vasovagal response, dehydration  Patient not having any neuro deficits,  stroke unlikely. Patient not anemic per labs.  Last ECHO 8/24 showed normal LVEF 92-33%, Grade 1 Diastolic dysfunction  Patient was in AfiB RVR and also has history of Paroxymal Afib upon ED arrival, converted to SR with Cardizem 10mg  IV Patient follows with Cardiologist MD Alvester Chou noting Tachybrady Syndrome-Recent event monitor showed fast and slow ventricular response with Afib and SVT  Cardiology EP consulted and recommended Amioadorone 200mg  BID for one month and 200mg  daily following   CHADVASC score 4 Continue eliquis 2.5 mg, PO, Daily   HLD hx Continue simvastatin 40mg  PO daily   HTN hx Metoprolol and Ramipril recently discontinued Recommendations from Cardiology   Dementia Hx Continue aricept   GERD hx Continue Protonix 40mg , PO Daily

## 2022-09-06 ENCOUNTER — Telehealth: Payer: Self-pay

## 2022-09-06 ENCOUNTER — Telehealth: Payer: Self-pay | Admitting: Cardiovascular Disease

## 2022-09-06 MED ORDER — ROSUVASTATIN CALCIUM 40 MG PO TABS: 40.0000 mg | ORAL_TABLET | Freq: Every day | ORAL | 6 refills | Status: DC

## 2022-09-06 NOTE — Telephone Encounter (Signed)
Noted! Thank you

## 2022-09-06 NOTE — Telephone Encounter (Signed)
Pt daughter notified she will p/u new rx and stop tonight and start th rest as well. New rx sent to pharmacy.

## 2022-09-06 NOTE — Telephone Encounter (Signed)
Pt c/o medication issue:  1. Name of Medication: Amiodarone  2. How are you currently taking this medication (dosage and times per day)?   3. Are you having a reaction (difficulty breathing--STAT)?   4. What is your medication issue? She was prescribed this medicine was prescribed while she weas in the hospital. Martin Majestic she went to the pharmacy toio pick it up, the pharmacist told her to check with the doctor. She was told that that there was an interaction with  this medicine and Simvastatin and Donepezil

## 2022-09-06 NOTE — Telephone Encounter (Signed)
Recommend switching simvastatin 40mg  to rosuvastatin 10mg .  Recommend discontinuing donepezil since syncope can be an adverse effect. Patient recently hospitalized for syncope and falling.  Ok to fill amiodarone. Check EKG at appointment with Dr Lovena Le on 10/31.

## 2022-09-06 NOTE — Telephone Encounter (Signed)
Transition Care Management Follow-up Telephone Call Date of discharge and from where: [facility] 09/05/2022 How have you been since you were released from the hospital? better Any questions or concerns? No  Items Reviewed: Did the pt receive and understand the discharge instructions provided? Yes  Medications obtained and verified? Yes  Other? No  Any new allergies since your discharge? No  Dietary orders reviewed? Yes Do you have support at home? Yes   Home Care and Equipment/Supplies: Were home health services ordered? no If so, what is the name of the agency? N/a  Has the agency set up a time to come to the patient's home? not applicable Were any new equipment or medical supplies ordered?  No What is the name of the medical supply agency? unknown Were you able to get the supplies/equipment? not applicable Do you have any questions related to the use of the equipment or supplies? No  Functional Questionnaire: (I = Independent and D = Dependent) ADLs: I  Bathing/Dressing- I  Meal Prep- I  Eating- I  Maintaining continence- I  Transferring/Ambulation- I  Managing Meds- I  Follow up appointments reviewed:  PCP Hospital f/u appt confirmed? Yes  Scheduled to see Dr Alain Marion on 12/15/2021 @ 3:40. [facility] f/u appt confirmed? No   Are transportation arrangements needed? No  If their condition worsens, is the pt aware to call PCP or go to the Emergency Dept.? Yes Was the patient provided with contact information for the PCP's office or ED? Yes Was to pt encouraged to call back with questions or concerns? Yes  Juanda Crumble, LPN [facility] Direct Dial [phone]

## 2022-09-07 ENCOUNTER — Telehealth: Payer: Self-pay | Admitting: Cardiovascular Disease

## 2022-09-07 MED ORDER — ROSUVASTATIN CALCIUM 10 MG PO TABS: 10.0000 mg | ORAL_TABLET | Freq: Every day | ORAL | 3 refills | Status: AC

## 2022-09-07 NOTE — Telephone Encounter (Signed)
Pt c/o medication issue:  1. Name of Medication:  rosuvastatin (CRESTOR)   2. How are you currently taking this medication (dosage and times per day)?   3. Are you having a reaction (difficulty breathing--STAT)?   4. What is your medication issue?   Toni Mcmillan of [facility] is following up. Toni Mcmillan states patient advised Crestor was changed to 10 MG (see 10/23 encounter), but the pharmacy received a prescription for 40 MG instead. Please clarify/send corrected Rx if necessary.

## 2022-09-07 NOTE — Addendum Note (Signed)
Addended by: Rockne Menghini on: 09/07/2022 11:02 AM   Modules accepted: Orders

## 2022-09-07 NOTE — Telephone Encounter (Signed)
This was a message from after hours answering service, I did not have Pharmacist on the phone.

## 2022-09-07 NOTE — Telephone Encounter (Signed)
Pt c/o medication issue:  1. Name of Medication:   rosuvastatin (CRESTOR) 40 MG tablet   2. How are you currently taking this medication (dosage and times per day)? Not taking yet  3. Are you having a reaction (difficulty breathing--STAT)? no  4. What is your medication issue? Toni Mcmillan from [facility] is calling because this prescription was filled for 40mg  but is usually filled for 10mg . Please advise.

## 2022-09-07 NOTE — Telephone Encounter (Signed)
This has already been taken care of

## 2022-09-14 ENCOUNTER — Institutional Professional Consult (permissible substitution): Payer: Medicare Other | Admitting: Internal Medicine

## 2022-09-14 ENCOUNTER — Ambulatory Visit: Payer: Medicare Other | Admitting: Internal Medicine

## 2022-09-14 ENCOUNTER — Encounter: Payer: Self-pay | Admitting: Internal Medicine

## 2022-09-14 VITALS — BP 162/90 | HR 70 | Temp 98.2°F | Ht 62.0 in | Wt 104.8 lb

## 2022-09-14 DIAGNOSIS — G309 Alzheimer's disease, unspecified: Secondary | ICD-10-CM | POA: Diagnosis not present

## 2022-09-14 DIAGNOSIS — R296 Repeated falls: Secondary | ICD-10-CM

## 2022-09-14 DIAGNOSIS — Z23 Encounter for immunization: Secondary | ICD-10-CM

## 2022-09-14 DIAGNOSIS — I4891 Unspecified atrial fibrillation: Secondary | ICD-10-CM

## 2022-09-14 DIAGNOSIS — F0280 Dementia in other diseases classified elsewhere without behavioral disturbance: Secondary | ICD-10-CM

## 2022-09-14 MED ORDER — MEMANTINE HCL 5 MG PO TABS: 5.0000 mg | ORAL_TABLET | Freq: Two times a day (BID) | ORAL | 5 refills | Status: DC

## 2022-09-14 NOTE — Assessment & Plan Note (Addendum)
Aricept 5 mg at hs - it was stopped due to syncope, amiodarone We can try Namenda for memory

## 2022-09-14 NOTE — Assessment & Plan Note (Signed)
No relapse 

## 2022-09-14 NOTE — Assessment & Plan Note (Signed)
On Amiodarone Aricept 5 mg at hs - it was stopped due to syncope, amiodarone We can try Namenda for memory

## 2022-09-14 NOTE — Progress Notes (Signed)
Subjective:  Patient ID: Toni Mcmillan, female    DOB: Jan 19, 1931  Age: 88 y.o. MRN: [id number]  CC: Follow-up (Hosp follow-up)   HPI Toni Mcmillan presents for another syncope and PAF. Doing better. She is  here w/her dtr. Off Aricept  Per hx: " Admit date: 09/04/2022 Discharge date: 09/05/2022   Time spent: 40 minutes   Recommendations for Outpatient Follow-up:  Follow outpatient CBC/CMP  Follow cardiology outpatient    Discharge Diagnoses:  Principal Problem:   Near syncope Active Problems:   Type 2 diabetes, controlled, with neuropathy ([place])   Essential hypertension   Memory change   PAF (paroxysmal atrial fibrillation) (HCC)   Elevated troponin     Discharge Condition: stable   Diet recommendation: heart healthy, diabetic      Filed Weights    09/04/22 1744  Weight: 46 kg      History of present illness:  Toni Mcmillan is a pleasant 88 y.o. female with medical history significant for diet-controlled diabetes mellitus, hypertension, memory loss, and paroxysmal atrial fibrillation on Eliquis who presents to the emergency department after an episode of lightheadedness with near syncope.   She was seen by cardiology who made recommendations with regards to her afib and recommended outpatient EP follow up.  Plan for amiodarone.  Question of orthostasis, instructed to change positions slowly.  Stable for discharge, see below for additional details.   Hospital Course:  Assessment and Plan: 1. Near-syncope  - Presents after a near-syncopal episode in setting of rapid a fib with rate 160s; she reports 4-5 similar episodes this month  - She converted to SR prior to arrival in ED and is now asymptomatic  - Recent ambulatory heart monitoring revealed tachycardia-bradycardia syndrome, AF with both rapid and slow ventricular response, and SVT with rates up to 200-range - Appreciate cardiology consultation, recommending amiodarone and follow up with EP clinic -  cards suspects possibly orthostatic, recommending change position slowly, if additional episodes will need ILR implant   2. PAF with RVR  - She was in a fib with rate 160s with EMS, was treated with 10 mg IV diltiazem, and converted to SR prior to arrival  - Continue cardiac monitoring, continue Eliquis (2.5 mg BID given age and weight)  - amiodarone started per cardiology   3. Elevated troponin  - Troponin was 11 and then 47 -> repeat flat - suspect demand related to fib - No chest pain or acute ischemic features on EKG, likely secondary to rapid a fib  - follow outpatient with cardiology    4. Hypertension  - Antihypertensives stopped in August 2023    5. Type II DM  - A1c was 6.6% in March 2023  - Diet-controlled at home    6. Memory loss  - Continue Aricept, use delirium precautions  - follow outpatient"   Outpatient Medications Prior to Visit  Medication Sig Dispense Refill   amiodarone (PACERONE) 200 MG tablet Take 1 tablet (200 mg total) by mouth 2 (two) times daily for 30 days, THEN 1 tablet (200 mg total) daily. 90 tablet 0   apixaban (ELIQUIS) 2.5 MG TABS tablet Take 1 tablet (2.5 mg total) by mouth 2 (two) times daily. 60 tablet 3   aspirin-acetaminophen-caffeine (EXCEDRIN MIGRAINE) 250-250-65 MG tablet Take 2 tablets by mouth as needed for headache. Pt takes 250-65 mg as needed.     Cholecalciferol (VITAMIN D-3) 25 MCG (1000 UT) CAPS Take 1,000 Units by mouth daily.  Multiple Vitamins-Minerals (ONE-A-DAY WOMENS 50+) TABS Take 1 tablet by mouth daily with breakfast.     Omega-3 Fatty Acids (FISH OIL PO) Take 1,000 mg by mouth every morning.     pantoprazole (PROTONIX) 40 MG tablet Take 1 tablet (40 mg total) by mouth daily. 30 tablet 1   Polyvinyl Alcohol-Povidone (REFRESH OP) Place 1-2 drops into both eyes daily as needed (dry eyes).     rosuvastatin (CRESTOR) 10 MG tablet Take 1 tablet (10 mg total) by mouth daily. 90 tablet 3   senna-docusate (SENOKOT-S) 8.6-50 MG  tablet Take 2 tablets by mouth 2 (two) times daily. 60 tablet 5   vitamin C (ASCORBIC ACID) 500 MG tablet Take 500 mg by mouth daily.     Wheat Dextrin (BENEFIBER PO) Take by mouth See admin instructions. Mix 1 teaspoonful of powder into 4-8 ounces of water and drink once a day as needed for mild constipation     No facility-administered medications prior to visit.    ROS: Review of Systems  Objective:  BP (!) 162/90 (BP Location: Left Arm)   Pulse 70   Temp 98.2 F (36.8 C) (Oral)   Ht 5\' 2"  (1.575 m)   Wt 104 lb 12.8 oz (47.5 kg)   SpO2 97%   BMI 19.17 kg/m   BP Readings from Last 3 Encounters:  09/14/22 (!) 162/90  09/05/22 (!) 149/82  08/25/22 122/68    Wt Readings from Last 3 Encounters:  09/14/22 104 lb 12.8 oz (47.5 kg)  09/04/22 101 lb 6.6 oz (46 kg)  08/25/22 103 lb 6.3 oz (46.9 kg)    Physical Exam  Lab Results  Component Value Date   WBC 8.8 09/05/2022   HGB 10.2 (L) 09/05/2022   HCT 31.6 (L) 09/05/2022   PLT 285 09/05/2022   GLUCOSE 95 09/05/2022   CHOL 126 02/12/2022   TRIG 147.0 02/12/2022   HDL 45.10 02/12/2022   LDLDIRECT 84.0 12/11/2019   LDLCALC 52 02/12/2022   ALT 12 03/05/2022   AST 20 03/05/2022   NA 140 09/05/2022   K 4.2 09/05/2022   CL 109 09/05/2022   CREATININE 0.68 09/05/2022   BUN 16 09/05/2022   CO2 24 09/05/2022   TSH 1.503 03/04/2022   HGBA1C 5.6 09/05/2022   MICROALBUR 1.4 02/12/2022    DG Chest 1 View  Result Date: 09/04/2022 CLINICAL DATA:  Chest pain EXAM: CHEST  1 VIEW COMPARISON:  07/03/2022 FINDINGS: The heart size and mediastinal contours are within normal limits. Aortic atherosclerosis. No focal airspace consolidation, pleural effusion, or pneumothorax. The visualized skeletal structures are unremarkable. IMPRESSION: No active disease. Electronically Signed   By: Davina Poke D.O.   On: 09/04/2022 12:25    Assessment & Plan:   Problem List Items Addressed This Visit     Alzheimer disease ([place])     Aricept 5 mg at hs - it was stopped due to syncope, amiodarone We can try Namenda for memory      Relevant Medications   memantine (NAMENDA) 5 MG tablet   Atrial fibrillation with RVR (HCC)    On Amiodarone Aricept 5 mg at hs - it was stopped due to syncope, amiodarone We can try Namenda for memory       Falls frequently    No relapse      Other Visit Diagnoses     Needs flu shot    -  Primary   Relevant Orders   Flu Vaccine QUAD High Dose(Fluad) (Completed)  Meds ordered this encounter  Medications   memantine (NAMENDA) 5 MG tablet    Sig: Take 1 tablet (5 mg total) by mouth 2 (two) times daily.    Dispense:  60 tablet    Refill:  5      Follow-up: Return in about 2 months (around 11/14/2022) for a follow-up visit.  Walker Kehr, MD

## 2022-10-05 ENCOUNTER — Ambulatory Visit: Payer: Medicare Other | Attending: Cardiology | Admitting: Cardiology

## 2022-10-05 ENCOUNTER — Encounter: Payer: Self-pay | Admitting: Cardiology

## 2022-10-05 ENCOUNTER — Telehealth: Payer: Self-pay | Admitting: Cardiology

## 2022-10-05 VITALS — BP 162/82 | HR 64 | Ht 62.0 in | Wt 104.0 lb

## 2022-10-05 DIAGNOSIS — I480 Paroxysmal atrial fibrillation: Secondary | ICD-10-CM

## 2022-10-05 DIAGNOSIS — D6869 Other thrombophilia: Secondary | ICD-10-CM

## 2022-10-05 DIAGNOSIS — R55 Syncope and collapse: Secondary | ICD-10-CM

## 2022-10-05 DIAGNOSIS — I471 Supraventricular tachycardia, unspecified: Secondary | ICD-10-CM | POA: Diagnosis not present

## 2022-10-05 NOTE — Progress Notes (Signed)
Electrophysiology Office Note   Date:  10/05/2022   ID:  Toni Mcmillan, DOB 04/30/1931, MRN [id number]  PCP:  Tresa Garter, MD  Cardiologist:  Allyson Sabal Primary Electrophysiologist:  Nansi Birmingham Jorja Loa, MD    Chief Complaint: AF/SVT   History of Present Illness: Toni Mcmillan is a 88 y.o. female who is being seen today for the evaluation of AF/SVT at the request of Plotnikov, Georgina Quint, MD. Presenting today for electrophysiology evaluation.  She has a history significant for hypertension, hyperlipidemia, atrial fibrillation, SVT.  She presented to the hospital after an episode of lightheadedness and visual changes.  She has had multiple episodes of syncope.  She sat down and called her neighbor but felt improved after she sat down.  She wore a cardiac monitor that showed episodes of atrial fibrillation and SVT.  Syncopal episodes mainly occur in the morning.  Not necessarily related to standing.  Today, she denies symptoms of palpitations, chest pain, shortness of breath, orthopnea, PND, lower extremity edema, claudication, dizziness, presyncope, syncope, bleeding, or neurologic sequela. The patient is tolerating medications without difficulties.  Her hospitalization, she has done well.  She has noted no further episodes of tachypalpitations.  She is also not had any further episodes of syncope.   Past Medical History:  Diagnosis Date   A-fib (HCC)    Allergic rhinitis, cause unspecified    Diabetes mellitus without complication (HCC)    borderline"never on meds"   Disturbances of sensation of smell and taste    Dizziness    Headache(784.0)    has decreased to none in later years   Insomnia, unspecified    Other and unspecified hyperlipidemia    Personal history of other diseases of digestive system    Scarlet fever    Unspecified essential hypertension    Metoprolol decreased due to drop in BP readings.   Unspecified sinusitis (chronic)    Whooping cough,  unspecified organism    Past Surgical History:  Procedure Laterality Date   ABDOMINAL HYSTERECTOMY     ANAL RECTAL MANOMETRY N/A 07/07/2015   Procedure: ANO RECTAL MANOMETRY;  Surgeon: Charolett Bumpers, MD;  Location: [facility];  Service: Endoscopy;  Laterality: N/A;   BILATERAL OOPHORECTOMY     CATARACT EXTRACTION, BILATERAL     COLONOSCOPY W/ POLYPECTOMY     COLONOSCOPY WITH PROPOFOL N/A 05/29/2015   Procedure: COLONOSCOPY WITH PROPOFOL;  Surgeon: Charolett Bumpers, MD;  Location: [facility];  Service: Endoscopy;  Laterality: N/A;   correction of hammertoe deformity     CYSTOSCOPY     w/ stenting of both ureters   HEMORRHOID SURGERY     hydrosalpinx diagnostic laparoscopy     TONSILLECTOMY  age 72   vericose vein surgery       Current Outpatient Medications  Medication Sig Dispense Refill   amiodarone (PACERONE) 200 MG tablet Take 1 tablet (200 mg total) by mouth 2 (two) times daily for 30 days, THEN 1 tablet (200 mg total) daily. 90 tablet 0   apixaban (ELIQUIS) 2.5 MG TABS tablet Take 1 tablet (2.5 mg total) by mouth 2 (two) times daily. 60 tablet 3   aspirin-acetaminophen-caffeine (EXCEDRIN MIGRAINE) 250-250-65 MG tablet Take 2 tablets by mouth as needed for headache. Pt takes 250-65 mg as needed.     Cholecalciferol (VITAMIN D-3) 25 MCG (1000 UT) CAPS Take 1,000 Units by mouth daily.     Multiple Vitamins-Minerals (ONE-A-DAY WOMENS 50+) TABS Take 1 tablet by mouth daily with breakfast.  Omega-3 Fatty Acids (FISH OIL PO) Take 1,000 mg by mouth every morning.     pantoprazole (PROTONIX) 40 MG tablet Take 1 tablet (40 mg total) by mouth daily. 30 tablet 1   Polyvinyl Alcohol-Povidone (REFRESH OP) Place 1-2 drops into both eyes daily as needed (dry eyes).     rosuvastatin (CRESTOR) 10 MG tablet Take 1 tablet (10 mg total) by mouth daily. 90 tablet 3   senna-docusate (SENOKOT-S) 8.6-50 MG tablet Take 2 tablets by mouth 2 (two) times daily. 60 tablet 5   vitamin C (ASCORBIC  ACID) 500 MG tablet Take 500 mg by mouth daily.     Wheat Dextrin (BENEFIBER PO) Take by mouth See admin instructions. Mix 1 teaspoonful of powder into 4-8 ounces of water and drink once a day as needed for mild constipation     memantine (NAMENDA) 5 MG tablet Take 1 tablet (5 mg total) by mouth 2 (two) times daily. (Patient not taking: Reported on 10/05/2022) 60 tablet 5   No current facility-administered medications for this visit.    Allergies:   Celecoxib, Ciprofloxacin, Cleocin [clindamycin], Clindamycin hcl, Erythromycin, Gabapentin, Meperidine, Meperidine hcl, Morphine, Morphine sulfate, Tetracycline, Triprolidine-pseudoephedrine, Zolpidem, and Latex   Social History:  The patient  reports that she has never smoked. She has never used smokeless tobacco. She reports that she does not drink alcohol and does not use drugs.   Family History:  The patient's family history includes Cancer in her mother and paternal grandmother; Diabetes in her mother; Hyperlipidemia in her father; Hypertension in her father; Other in her father and mother.    ROS:  Please see the history of present illness.   Otherwise, review of systems is positive for none.   All other systems are reviewed and negative.    PHYSICAL EXAM: VS:  BP (!) 162/82   Pulse 64   Ht 5\' 2"  (1.575 m)   Wt 104 lb (47.2 kg)   SpO2 97%   BMI 19.02 kg/m  , BMI Body mass index is 19.02 kg/m. GEN: Well nourished, well developed, in no acute distress  HEENT: normal  Neck: no JVD, carotid bruits, or masses Cardiac: RRR; no murmurs, rubs, or gallops,no edema  Respiratory:  clear to auscultation bilaterally, normal work of breathing GI: soft, nontender, nondistended, + BS MS: no deformity or atrophy  Skin: warm and dry Neuro:  Strength and sensation are intact Psych: euthymic mood, full affect  EKG:  EKG is ordered today. Personal review of the ekg ordered shows [place] rhythm, rate 63  Recent Labs: 03/04/2022: TSH  1.503 03/05/2022: ALT 12; B Natriuretic Peptide 119.2 09/05/2022: BUN 16; Creatinine, Ser 0.68; Hemoglobin 10.2; Magnesium 2.1; Platelets 285; Potassium 4.2; Sodium 140    Lipid Panel     Component Value Date/Time   CHOL 126 02/12/2022 1703   TRIG 147.0 02/12/2022 1703   HDL 45.10 02/12/2022 1703   CHOLHDL 3 02/12/2022 1703   VLDL 29.4 02/12/2022 1703   LDLCALC 52 02/12/2022 1703   LDLDIRECT 84.0 12/11/2019 1208     Wt Readings from Last 3 Encounters:  10/05/22 104 lb (47.2 kg)  09/14/22 104 lb 12.8 oz (47.5 kg)  09/04/22 101 lb 6.6 oz (46 kg)      Other studies Reviewed: Additional studies/ records that were reviewed today include: TTE 07/08/22  Review of the above records today demonstrates:   1. Left ventricular ejection fraction, by estimation, is 60 to 65%. The  left ventricle has normal function. The left ventricle  has no regional  wall motion abnormalities. Left ventricular diastolic parameters are  consistent with Grade I diastolic  dysfunction (impaired relaxation).   2. Right ventricular systolic function is normal. The right ventricular  size is normal. There is normal pulmonary artery systolic pressure. The  estimated right ventricular systolic pressure is 22.0 mmHg.   3. The mitral valve is normal in structure. Mild mitral valve  regurgitation.   4. Tricuspid valve regurgitation is mild to moderate.   5. The aortic valve is tricuspid. Aortic valve regurgitation is not  visualized. Aortic valve sclerosis/calcification is present, without any  evidence of aortic stenosis.   6. The inferior vena cava is normal in size with greater than 50%  respiratory variability, suggesting right atrial pressure of 3 mmHg.   Cardiac monitor 08/08/2022 personally reviewed SR/SB/ST Freq PVCs (4%) Freq long episodes up to 200 BPM SVT Freq runs of Afib with Fast and slow VR  ASSESSMENT AND PLAN:  1.  Paroxysmal atrial fibrillation: CHA2DS2-VASc of 3.  Currently on Eliquis 2.5  mg twice daily.  Has been started on amiodarone 200 mg daily.  She is currently feeling well.  Per the patient, she is better than she has been over the last few months.  We Richy Spradley continue with current management.  2.  Syncope: Previously sounded somewhat orthostatic.  No further episodes since being in the hospital.  We Alexie Samson continue with current management.  3.  Secondary hypercoagulable state: Currently on Eliquis for atrial fibrillation as above  4.  SVT: No further episodes.  Current medicines are reviewed at length with the patient today.   The patient does not have concerns regarding her medicines.  The following changes were made today:  none  Labs/ tests ordered today include:  Orders Placed This Encounter  Procedures   EKG 12-Lead     Disposition:   FU 6 months  Signed, Shalene Gallen Jorja Loa, MD  10/05/2022 2:22 PM     [facility] [address] [place] [address] [phone] (office) [phone] (fax)

## 2022-10-05 NOTE — Patient Instructions (Signed)
Medication Instructions:  Your physician recommends that you continue on your current medications as directed. Please refer to the Current Medication list given to you today.  *If you need a refill on your cardiac medications before your next appointment, please call your pharmacy*   Lab Work: None ordered  Testing/Procedures: None ordered   Follow-Up: At [facility], you and your health needs are our priority.  As part of our continuing mission to provide you with exceptional heart care, we have created designated Provider Care Teams.  These Care Teams include your primary Cardiologist (physician) and Advanced Practice Providers (APPs -  Physician Assistants and Nurse Practitioners) who all work together to provide you with the care you need, when you need it.   Your next appointment:   6 month(s)  The format for your next appointment:   In Person  Provider:   You will see one of the following Advanced Practice Providers on your designated Care Team:   Renee Ursuy, PA-C Michael "Andy" Tillery, PA-C    Thank you for choosing [facility]!!   Ilhan Debenedetto, RN [phone]  Other Instructions   Important Information About Sugar           

## 2022-10-05 NOTE — Telephone Encounter (Signed)
Spoke to dtr (verbal given by pt) Aware that yes pt should be taking Amiodarone 200 mg once daily going forward. Dtr verbalized understanding and agreeable to plan.

## 2022-10-05 NOTE — Telephone Encounter (Signed)
Pt c/o medication issue:  1. Name of Medication:  amiodarone (PACERONE) 200 MG tablet  2. How are you currently taking this medication (dosage and times per day)?   3. Are you having a reaction (difficulty breathing--STAT)?    4. What is your medication issue?   Patient's daughter would like to confirm that Amiodarone is being reduced from twice daily to once daily starting today.

## 2022-11-02 ENCOUNTER — Encounter: Payer: Self-pay | Admitting: Internal Medicine

## 2022-11-03 MED ORDER — MEMANTINE HCL 5 MG PO TABS: 5.0000 mg | ORAL_TABLET | Freq: Two times a day (BID) | ORAL | 5 refills | Status: DC

## 2022-11-04 ENCOUNTER — Other Ambulatory Visit: Payer: Self-pay

## 2022-11-04 MED ORDER — AMIODARONE HCL 200 MG PO TABS: 200.0000 mg | ORAL_TABLET | Freq: Every day | ORAL | 3 refills | Status: AC

## 2022-11-17 ENCOUNTER — Encounter: Payer: Self-pay | Admitting: Internal Medicine

## 2022-12-08 ENCOUNTER — Ambulatory Visit: Payer: Medicare Other | Admitting: Internal Medicine

## 2022-12-08 ENCOUNTER — Encounter: Payer: Self-pay | Admitting: Internal Medicine

## 2022-12-08 VITALS — BP 130/82 | HR 55 | Temp 98.1°F | Ht 62.0 in | Wt 101.0 lb

## 2022-12-08 DIAGNOSIS — I480 Paroxysmal atrial fibrillation: Secondary | ICD-10-CM | POA: Diagnosis not present

## 2022-12-08 DIAGNOSIS — F0280 Dementia in other diseases classified elsewhere without behavioral disturbance: Secondary | ICD-10-CM

## 2022-12-08 DIAGNOSIS — I4891 Unspecified atrial fibrillation: Secondary | ICD-10-CM

## 2022-12-08 DIAGNOSIS — G309 Alzheimer's disease, unspecified: Secondary | ICD-10-CM

## 2022-12-08 DIAGNOSIS — F4321 Adjustment disorder with depressed mood: Secondary | ICD-10-CM

## 2022-12-08 NOTE — Assessment & Plan Note (Addendum)
Intolerant of Namenda, Aricept  You can try lion's mane mushrooms for memory

## 2022-12-08 NOTE — Assessment & Plan Note (Signed)
Pt is taking Eliquis too close to each other sometimes - no other issues Will continue Eliquis D/c fish oil 

## 2022-12-08 NOTE — Progress Notes (Signed)
Subjective:  Patient ID: Toni Mcmillan, female    DOB: 1931/02/01  Age: 89 y.o. MRN: [id number]  CC: No chief complaint on file.   HPI Toni Mcmillan presents for dementia, A fib  The pt is here w/dtr Jeanett Schlein Pt is taking Eliquis too close to each other sometimes   Outpatient Medications Prior to Visit  Medication Sig Dispense Refill   amiodarone (PACERONE) 200 MG tablet Take 1 tablet (200 mg total) by mouth daily. 90 tablet 3   apixaban (ELIQUIS) 2.5 MG TABS tablet Take 1 tablet (2.5 mg total) by mouth 2 (two) times daily. 60 tablet 3   aspirin-acetaminophen-caffeine (EXCEDRIN MIGRAINE) 250-250-65 MG tablet Take 2 tablets by mouth as needed for headache. Pt takes 250-65 mg as needed.     Cholecalciferol (VITAMIN D-3) 25 MCG (1000 UT) CAPS Take 1,000 Units by mouth daily.     Multiple Vitamins-Minerals (ONE-A-DAY WOMENS 50+) TABS Take 1 tablet by mouth daily with breakfast.     Omega-3 Fatty Acids (FISH OIL PO) Take 1,000 mg by mouth every morning.     Polyvinyl Alcohol-Povidone (REFRESH OP) Place 1-2 drops into both eyes daily as needed (dry eyes).     ramipril (ALTACE) 2.5 MG capsule Take 2.5 mg by mouth daily.     rosuvastatin (CRESTOR) 10 MG tablet Take 1 tablet (10 mg total) by mouth daily. 90 tablet 3   vitamin C (ASCORBIC ACID) 500 MG tablet Take 500 mg by mouth daily.     Wheat Dextrin (BENEFIBER PO) Take by mouth See admin instructions. Mix 1 teaspoonful of powder into 4-8 ounces of water and drink once a day as needed for mild constipation     memantine (NAMENDA) 5 MG tablet Take 1 tablet (5 mg total) by mouth 2 (two) times daily. 60 tablet 5   pantoprazole (PROTONIX) 40 MG tablet Take 1 tablet (40 mg total) by mouth daily. 30 tablet 1   No facility-administered medications prior to visit.    ROS: Review of Systems  Constitutional:  Positive for fatigue. Negative for activity change, appetite change, chills and unexpected weight change.  HENT:  Negative for  congestion, mouth sores and sinus pressure.   Eyes:  Negative for visual disturbance.  Respiratory:  Negative for cough and chest tightness.   Gastrointestinal:  Negative for abdominal pain and nausea.  Genitourinary:  Negative for difficulty urinating, frequency and vaginal pain.  Musculoskeletal:  Positive for gait problem. Negative for back pain.  Skin:  Negative for pallor and rash.  Neurological:  Negative for dizziness, tremors, weakness, numbness and headaches.  Psychiatric/Behavioral:  Positive for confusion and decreased concentration. Negative for behavioral problems, sleep disturbance and suicidal ideas.     Objective:  BP 130/82 (BP Location: Right Arm, Patient Position: Sitting, Cuff Size: Large)   Pulse (!) 55   Temp 98.1 F (36.7 C) (Oral)   Ht 5\' 2"  (1.575 m)   Wt 101 lb (45.8 kg)   SpO2 97%   BMI 18.47 kg/m   BP Readings from Last 3 Encounters:  12/08/22 130/82  10/05/22 (!) 162/82  09/14/22 (!) 162/90    Wt Readings from Last 3 Encounters:  12/08/22 101 lb (45.8 kg)  10/05/22 104 lb (47.2 kg)  09/14/22 104 lb 12.8 oz (47.5 kg)    Physical Exam Constitutional:      General: She is not in acute distress.    Appearance: Normal appearance. She is well-developed.  HENT:     Head: Normocephalic.  Right Ear: External ear normal.     Left Ear: External ear normal.     Nose: Nose normal.  Eyes:     General:        Right eye: No discharge.        Left eye: No discharge.     Conjunctiva/sclera: Conjunctivae normal.     Pupils: Pupils are equal, round, and reactive to light.  Neck:     Thyroid: No thyromegaly.     Vascular: No JVD.     Trachea: No tracheal deviation.  Cardiovascular:     Rate and Rhythm: Normal rate and regular rhythm.     Heart sounds: Normal heart sounds.  Pulmonary:     Effort: No respiratory distress.     Breath sounds: No stridor. No wheezing.  Abdominal:     General: Bowel sounds are normal. There is no distension.      Palpations: Abdomen is soft. There is no mass.     Tenderness: There is no abdominal tenderness. There is no guarding or rebound.  Musculoskeletal:        General: No tenderness.     Cervical back: Normal range of motion and neck supple. No rigidity.  Lymphadenopathy:     Cervical: No cervical adenopathy.  Skin:    Findings: No erythema or rash.  Neurological:     Mental Status: Mental status is at baseline.     Cranial Nerves: No cranial nerve deficit.     Motor: No abnormal muscle tone.     Coordination: Coordination normal.     Gait: Gait abnormal.     Deep Tendon Reflexes: Reflexes normal.  Psychiatric:        Mood and Affect: Mood normal.        Behavior: Behavior normal.        Thought Content: Thought content normal.     Lab Results  Component Value Date   WBC 8.8 09/05/2022   HGB 10.2 (L) 09/05/2022   HCT 31.6 (L) 09/05/2022   PLT 285 09/05/2022   GLUCOSE 95 09/05/2022   CHOL 126 02/12/2022   TRIG 147.0 02/12/2022   HDL 45.10 02/12/2022   LDLDIRECT 84.0 12/11/2019   LDLCALC 52 02/12/2022   ALT 12 03/05/2022   AST 20 03/05/2022   NA 140 09/05/2022   K 4.2 09/05/2022   CL 109 09/05/2022   CREATININE 0.68 09/05/2022   BUN 16 09/05/2022   CO2 24 09/05/2022   TSH 1.503 03/04/2022   HGBA1C 5.6 09/05/2022   MICROALBUR 1.4 02/12/2022    DG Chest 1 View  Result Date: 09/04/2022 CLINICAL DATA:  Chest pain EXAM: CHEST  1 VIEW COMPARISON:  07/03/2022 FINDINGS: The heart size and mediastinal contours are within normal limits. Aortic atherosclerosis. No focal airspace consolidation, pleural effusion, or pneumothorax. The visualized skeletal structures are unremarkable. IMPRESSION: No active disease. Electronically Signed   By: Davina Poke D.O.   On: 09/04/2022 12:25    Assessment & Plan:   Problem List Items Addressed This Visit       Cardiovascular and Mediastinum   Atrial fibrillation with RVR ([place]) - Primary    Pt is taking Eliquis too close to each  other sometimes - no other issues Will continue Eliquis D/c fish oil      Relevant Medications   ramipril (ALTACE) 2.5 MG capsule   PAF (paroxysmal atrial fibrillation) (HCC)    Pt is taking Eliquis too close to each other sometimes - no other issues Will  continue Eliquis D/c fish oil      Relevant Medications   ramipril (ALTACE) 2.5 MG capsule     Nervous and Auditory   Alzheimer disease (HCC)    Intolerant of Namenda, Aricept  You can try lion's mane mushrooms for memory        Other   Grief    Discussed          No orders of the defined types were placed in this encounter.     Follow-up: Return in about 3 months (around 03/09/2023) for a follow-up visit.  Sonda Primes, MD

## 2022-12-08 NOTE — Patient Instructions (Signed)
You can try lion's mane mushrooms for memory

## 2022-12-08 NOTE — Assessment & Plan Note (Signed)
Discussed.

## 2022-12-08 NOTE — Assessment & Plan Note (Signed)
Pt is taking Eliquis too close to each other sometimes - no other issues Will continue Eliquis D/c fish oil

## 2022-12-24 ENCOUNTER — Encounter: Payer: Self-pay | Admitting: Internal Medicine

## 2022-12-30 ENCOUNTER — Emergency Department (HOSPITAL_BASED_OUTPATIENT_CLINIC_OR_DEPARTMENT_OTHER): Payer: Medicare Other

## 2022-12-30 ENCOUNTER — Emergency Department (HOSPITAL_BASED_OUTPATIENT_CLINIC_OR_DEPARTMENT_OTHER): Payer: Medicare Other | Admitting: Radiology

## 2022-12-30 ENCOUNTER — Emergency Department (HOSPITAL_BASED_OUTPATIENT_CLINIC_OR_DEPARTMENT_OTHER)
Admission: EM | Admit: 2022-12-30 | Discharge: 2022-12-30 | Disposition: A | Discharge status: Home / Self Care | Payer: Medicare Other | Attending: Emergency Medicine | Admitting: Emergency Medicine

## 2022-12-30 ENCOUNTER — Telehealth: Payer: Self-pay | Admitting: Cardiology

## 2022-12-30 ENCOUNTER — Encounter: Payer: Self-pay | Admitting: Cardiovascular Disease

## 2022-12-30 ENCOUNTER — Encounter (HOSPITAL_BASED_OUTPATIENT_CLINIC_OR_DEPARTMENT_OTHER): Payer: Self-pay

## 2022-12-30 ENCOUNTER — Other Ambulatory Visit: Payer: Self-pay

## 2022-12-30 DIAGNOSIS — S3210XA Unspecified fracture of sacrum, initial encounter for closed fracture: Secondary | ICD-10-CM | POA: Insufficient documentation

## 2022-12-30 DIAGNOSIS — Y92009 Unspecified place in unspecified non-institutional (private) residence as the place of occurrence of the external cause: Secondary | ICD-10-CM | POA: Insufficient documentation

## 2022-12-30 DIAGNOSIS — S5001XA Contusion of right elbow, initial encounter: Secondary | ICD-10-CM | POA: Diagnosis not present

## 2022-12-30 DIAGNOSIS — W01198A Fall on same level from slipping, tripping and stumbling with subsequent striking against other object, initial encounter: Secondary | ICD-10-CM | POA: Diagnosis not present

## 2022-12-30 DIAGNOSIS — I6789 Other cerebrovascular disease: Secondary | ICD-10-CM | POA: Diagnosis not present

## 2022-12-30 DIAGNOSIS — Z91040 Latex allergy status: Secondary | ICD-10-CM | POA: Insufficient documentation

## 2022-12-30 DIAGNOSIS — Z7982 Long term (current) use of aspirin: Secondary | ICD-10-CM | POA: Diagnosis not present

## 2022-12-30 DIAGNOSIS — Z7901 Long term (current) use of anticoagulants: Secondary | ICD-10-CM | POA: Diagnosis not present

## 2022-12-30 DIAGNOSIS — S3992XA Unspecified injury of lower back, initial encounter: Secondary | ICD-10-CM | POA: Diagnosis present

## 2022-12-30 DIAGNOSIS — R296 Repeated falls: Secondary | ICD-10-CM

## 2022-12-30 DIAGNOSIS — S0990XA Unspecified injury of head, initial encounter: Secondary | ICD-10-CM | POA: Diagnosis not present

## 2022-12-30 LAB — CBG MONITORING, ED: Glucose-Capillary: 120 mg/dL — ABNORMAL HIGH (ref 70–99)

## 2022-12-30 NOTE — Telephone Encounter (Signed)
Spoke with pt's daughter, DPR who reports pt fell today and hit her head.  She is not sure why she fell.  She states pt has been very sluggish and wants to sleep more over the past few weeks.  Pt's daughter is asking if it could possibly be medications that need adjusting.  Pt's daughter advised due to pt's fall, hitting her head and being on Eliquis pt should be further evaluated in the ED due to risk of brain bleed.  Pt's daughter verbalizes understanding and agrees with current plan.

## 2022-12-30 NOTE — Telephone Encounter (Signed)
Voided first letter. Re-written letter w/ pt daughter Altha Harm) due to her being pt care taker. Replied back to pt letter is ready for pick-up.Marland KitchenJohny Chess

## 2022-12-30 NOTE — Telephone Encounter (Signed)
Pt c/o Syncope: STAT if syncope occurred within 30 minutes and pt complains of lightheadedness High Priority if episode of passing out, completely, today or in last 24 hours   Did you pass out today? No   When is the last time you passed out?  [place] [address] today   Has this occurred multiple times?    Did you have any symptoms prior to passing out?  Nausea, drowsiness, SOB and unsteady on feet.   Pt's daughter states that she isn't passing out currently, but seems to be falling down more often. They are not sure if it is due to almost passing out or something else. Pt's daughter is wondering if this is medication related.

## 2022-12-30 NOTE — Telephone Encounter (Signed)
Error

## 2022-12-30 NOTE — ED Triage Notes (Signed)
Patient here POV from Home.  Endorses having her [employer] on today when she slipped and fell in her home. Hit her Head on the [facility] Floor.   No Dizziness or LOC. Visitor states the Patient went to Bed afterwards and went to sleep. Patient does take Eliquis.   NAD Noted during Triage. A&Ox4. GCS 15. Ambulatory with Cane. Patient does appear somewhat confused in triage as she had some difficulty recalling her Rudene Anda.

## 2022-12-30 NOTE — Telephone Encounter (Signed)
Spoke to [employer], dtr. Informed that I am not sure that medication adjustment is needed from our end, as do not think what EP prescribed and that pt has been taking since last year is the possible culprit to recent occurrences/symptoms. Aware I will discuss w/ Camnitz tomorrow and I will only reach out if he has other advisement than following up w/ PCP. Dtr agreeable to plan.

## 2022-12-30 NOTE — Telephone Encounter (Signed)
Generated letter place on MD desk for signature.Marland KitchenJohny Chess

## 2022-12-30 NOTE — Discharge Instructions (Addendum)
You were seen for your tailbone fracture and fall in the emergency department.   At home, please take Tylenol and use over-the-counter lidocaine patches for your pain.  Please also go to your local pharmacy and buy a doughnut pillow or round seat cushion for pain.    Check your MyChart online for the results of any tests that had not resulted by the time you left the emergency department.   Follow-up with your primary doctor in 2-3 days regarding your visit.    Return immediately to the emergency department if you experience any of the following: Severe pain, weakness or numbness of your legs, fainting, or any other concerning symptoms.    Thank you for visiting our Emergency Department. It was a pleasure taking care of you today.

## 2022-12-30 NOTE — ED Provider Notes (Signed)
[facility] Provider Note   CSN: [id number] Arrival date & time: 12/30/22  1628     History  Chief Complaint  Patient presents with   Lytle Michaels    Toni Mcmillan is a 89 y.o. female.  89 year old female with history of atrial fibrillation on Eliquis who presents emergency department with head strike and elbow pain after a fall.  Patient was walking today at home and reports that she was wearing slippers when her right leg slipped out from under her and she fell onto her bottom also striking her right elbow and head on the ground.  Says that she got up and went back in bed.  Did play cards with another individual afterwards but was having persistent pain so her daughter convinced her to come to the emergency department for evaluation.  Has been ambulatory since then.  Denied any dizziness, chest pain, or shortness of breath preceding the fall.  States she did not lose consciousness.       Home Medications Prior to Admission medications   Medication Sig Start Date End Date Taking? Authorizing Provider  amiodarone (PACERONE) 200 MG tablet Take 1 tablet (200 mg total) by mouth daily. 11/04/22   Camnitz, Ocie Doyne, MD  apixaban (ELIQUIS) 2.5 MG TABS tablet Take 1 tablet (2.5 mg total) by mouth 2 (two) times daily. 03/08/22   Fenton, Clint R, PA  aspirin-acetaminophen-caffeine (EXCEDRIN MIGRAINE) [phone] MG tablet Take 2 tablets by mouth as needed for headache. Pt takes 250-65 mg as needed.    [provider]  Cholecalciferol (VITAMIN D-3) 25 MCG (1000 UT) CAPS Take 1,000 Units by mouth daily.    [provider]  Multiple Vitamins-Minerals (ONE-A-DAY WOMENS 50+) TABS Take 1 tablet by mouth daily with breakfast.    [provider]  Omega-3 Fatty Acids (FISH OIL PO) Take 1,000 mg by mouth every morning.    [provider]  pantoprazole (PROTONIX) 40 MG tablet Take 1 tablet (40 mg total) by mouth daily. 09/05/22  11/04/22  Elodia Florence., MD  Polyvinyl Alcohol-Povidone (REFRESH OP) Place 1-2 drops into both eyes daily as needed (dry eyes).    [provider]  ramipril (ALTACE) 2.5 MG capsule Take 2.5 mg by mouth daily. 10/14/22   [provider]  rosuvastatin (CRESTOR) 10 MG tablet Take 1 tablet (10 mg total) by mouth daily. 09/07/22   Lorretta Harp, MD  vitamin C (ASCORBIC ACID) 500 MG tablet Take 500 mg by mouth daily.    [provider]  Wheat Dextrin (BENEFIBER PO) Take by mouth See admin instructions. Mix 1 teaspoonful of powder into 4-8 ounces of water and drink once a day as needed for mild constipation    [provider]      Allergies    Aricept [donepezil], Celecoxib, Ciprofloxacin, Cleocin [clindamycin], Clindamycin hcl, Erythromycin, Gabapentin, Meperidine, Meperidine hcl, Morphine, Morphine sulfate, Namenda [memantine], Tetracycline, Triprolidine-pseudoephedrine, Zolpidem, and Latex    Review of Systems   Review of Systems  Physical Exam Updated Vital Signs BP (!) 160/79   Pulse (!) 51   Temp 97.8 F (36.6 C) (Oral)   Resp 16   Ht 5' 2"$  (1.575 m)   Wt 45.8 kg   SpO2 98%   BMI 18.47 kg/m  Physical Exam Vitals and nursing note reviewed.  Constitutional:      General: She is not in acute distress.    Appearance: Normal appearance. She is well-developed. She is not ill-appearing.  HENT:     Head: Normocephalic and atraumatic.     Right Ear: External ear normal.     Left Ear: External ear normal.     Nose: Nose normal.     Mouth/Throat:     Mouth: Mucous membranes are moist.     Pharynx: Oropharynx is clear.  Eyes:     Extraocular Movements: Extraocular movements intact.     Conjunctiva/sclera: Conjunctivae normal.     Pupils: Pupils are equal, round, and reactive to light.  Neck:     Comments: No C-spine midline tenderness to palpation Cardiovascular:     Rate and Rhythm: Normal rate and regular rhythm.     Pulses: Normal  pulses.     Heart sounds: Normal heart sounds. No murmur heard. Pulmonary:     Effort: Pulmonary effort is normal. No respiratory distress.     Breath sounds: Normal breath sounds.  Abdominal:     General: Abdomen is flat. Bowel sounds are normal. There is no distension.     Palpations: Abdomen is soft. There is no mass.     Tenderness: There is no abdominal tenderness. There is no guarding.  Musculoskeletal:        General: No deformity. Normal range of motion.     Cervical back: Normal range of motion and neck supple. No rigidity or tenderness.     Right lower leg: No edema.     Left lower leg: No edema.     Comments: No tenderness to palpation of midline thoracic or lumbar spine.  No step-offs palpated.  Sacral tenderness to palpation.  No tenderness to palpation of chest wall.  No bruising noted.  No tenderness to palpation of bilateral clavicles.  No tenderness to palpation, bruising, or deformities noted of bilateral shoulders, wrists, hips, knees, or ankles.  Bruising and tenderness to palpation of right elbow.  Skin:    General: Skin is warm and dry.  Neurological:     General: No focal deficit present.     Mental Status: She is alert and oriented to person, place, and time. Mental status is at baseline.     Cranial Nerves: No cranial nerve deficit.     Sensory: No sensory deficit.     Motor: No weakness.  Psychiatric:        Mood and Affect: Mood normal.     ED Results / Procedures / Treatments   Labs (all labs ordered are listed, but only abnormal results are displayed) Labs Reviewed  CBG MONITORING, ED - Abnormal; Notable for the following components:      Result Value   Glucose-Capillary 120 (*)    All other components within normal limits    EKG EKG Interpretation  Date/Time:  Thursday December 30 2022 17:01:31 EST Ventricular Rate:  55 PR Interval:  204 QRS Duration: 106 QT Interval:  456 QTC Calculation: 437 R Axis:   -25 Text Interpretation: Sinus  rhythm Borderline left axis deviation Probable anteroseptal infarct, old No significant change since last tracing Confirmed by Deno Etienne [phone]) on 12/31/2022 11:55:58 AM  Radiology DG Sacrum/Coccyx  Result Date: 12/30/2022 CLINICAL DATA:  Status post slip and fall. EXAM: SACRUM AND COCCYX - 2+ VIEW COMPARISON:  None Available. FINDINGS: A small fracture deformity of indeterminate age is seen along the anterior aspect of the lower sacrum. This is best seen on the lateral view. There is no evidence of dislocation. Degenerative changes are seen within the visualized portions of both hips. IMPRESSION: Small fracture deformity  of indeterminate age along the anterior aspect of the lower sacrum. Electronically Signed   By: Virgina Norfolk M.D.   On: 12/30/2022 18:31   DG Elbow Complete Right  Result Date: 12/30/2022 CLINICAL DATA:  Status post slip and fall. EXAM: RIGHT ELBOW - COMPLETE 3+ VIEW COMPARISON:  None Available. FINDINGS: There is no evidence of acute fracture, dislocation, or joint effusion. Mild chronic and degenerative changes are seen adjacent to the dorsal aspect of the right radial head. Soft tissues are unremarkable. IMPRESSION: Mild chronic and degenerative changes without evidence of acute fracture or dislocation. Electronically Signed   By: Virgina Norfolk M.D.   On: 12/30/2022 18:21   CT Cervical Spine Wo Contrast  Result Date: 12/30/2022 CLINICAL DATA:  Status post slip and fall. EXAM: CT CERVICAL SPINE WITHOUT CONTRAST TECHNIQUE: Multidetector CT imaging of the cervical spine was performed without intravenous contrast. Multiplanar CT image reconstructions were also generated. RADIATION DOSE REDUCTION: This exam was performed according to the departmental dose-optimization program which includes automated exposure control, adjustment of the mA and/or kV according to patient size and/or use of iterative reconstruction technique. COMPARISON:  July 03, 2022 FINDINGS: Alignment:  Normal. Skull base and vertebrae: No acute fracture. Degenerative changes are seen involving the body and tip of the dens, as well as the adjacent portion of the anterior arch of C1. Soft tissues and spinal canal: No prevertebral fluid or swelling. No visible canal hematoma. Disc levels: There is marked severity endplate sclerosis, mild anterior osteophyte formation and moderate severity posterior bony spurring at the levels of C3-C4, C4-C5, C5-C6 and C6-C7. There is marked severity narrowing of the anterior atlantoaxial articulation. Marked severity intervertebral disc space narrowing is seen at C3-C4, C4-C5, C5-C6 and C6-C7. Upper chest: Negative. Other: None. IMPRESSION: Marked severity multilevel degenerative changes without evidence of an acute fracture or subluxation. Electronically Signed   By: Virgina Norfolk M.D.   On: 12/30/2022 18:20   CT Head Wo Contrast  Result Date: 12/30/2022 CLINICAL DATA:  Status post slip and fall. EXAM: CT HEAD WITHOUT CONTRAST TECHNIQUE: Contiguous axial images were obtained from the base of the skull through the vertex without intravenous contrast. RADIATION DOSE REDUCTION: This exam was performed according to the departmental dose-optimization program which includes automated exposure control, adjustment of the mA and/or kV according to patient size and/or use of iterative reconstruction technique. COMPARISON:  August 25, 2022 FINDINGS: Brain: There is mild cerebral atrophy with widening of the extra-axial spaces and ventricular dilatation. There are areas of decreased attenuation within the white matter tracts of the supratentorial brain, consistent with microvascular disease changes. Vascular: There is moderate severity bilateral cavernous carotid artery calcification. Skull: Normal. Negative for fracture or focal lesion. Sinuses/Orbits: No acute finding. Other: None. IMPRESSION: 1. No acute intracranial abnormality. 2. Generalized cerebral atrophy and microvascular  disease changes of the supratentorial brain. Electronically Signed   By: Virgina Norfolk M.D.   On: 12/30/2022 18:18    Procedures Procedures   Medications Ordered in ED Medications - No data to display  ED Course/ Medical Decision Making/ A&P                             Medical Decision Making Amount and/or Complexity of Data Reviewed Radiology: ordered.   Toni Mcmillan is a 89 y.o. female with comorbidities that complicate the patient evaluation including atrial fibrillation on Eliquis who presented to the emergency department with sacral pain, elbow pain,  and minor head trauma after a fall  Initial Ddx:  Stable fracture, elbow fracture, ICH, C-spine injury  MDM:  Concern about the above diagnoses given the patient's fall.  Does appear to be a mechanical fall so do not feel additional workup is indicated at this time.  Plan:  CT head CT C-spine X-ray right elbow Sacral x-ray  ED Summary/Re-evaluation:  On reassessment for which she was stable.  Was found to have no acute fractures aside from a sacral fracture.  No other injuries were noted on her CT scans.  Patient instructed to use donut pillow for comfort and Tylenol for pain.  Instructed to follow-up with her primary doctor in several days.  This patient presents to the ED for concern of complaints listed in HPI, this involves an extensive number of treatment options, and is a complaint that carries with it a high risk of complications and morbidity. Disposition including potential need for admission considered.   Dispo: DC Home. Return precautions discussed including, but not limited to, those listed in the AVS. Allowed pt time to ask questions which were answered fully prior to dc.  Additional history obtained from family Records reviewed Outpatient Clinic Notes I independently reviewed the following imaging with scope of interpretation limited to determining acute life threatening conditions related to emergency  care: CT Head and agree with the radiologist interpretation with the following exceptions: None I personally reviewed and interpreted cardiac monitoring: normal sinus rhythm  I personally reviewed and interpreted the pt's EKG: see above for interpretation  I have reviewed the patients home medications and made adjustments as needed Social Determinants of health:  Elderly   Final Clinical Impression(s) / ED Diagnoses Final diagnoses:  Closed fracture of sacrum, unspecified portion of sacrum, initial encounter ([place])  Fall in elderly patient    Rx / [facility] Orders ED Discharge Orders     None         Fransico Meadow, MD 01/01/23 1551

## 2022-12-31 ENCOUNTER — Ambulatory Visit: Payer: Medicare Other | Attending: Adult Health | Admitting: Adult Health

## 2022-12-31 ENCOUNTER — Encounter: Payer: Self-pay | Admitting: Adult Health

## 2022-12-31 VITALS — Ht 61.0 in | Wt 101.0 lb

## 2022-12-31 DIAGNOSIS — I480 Paroxysmal atrial fibrillation: Secondary | ICD-10-CM

## 2022-12-31 DIAGNOSIS — E785 Hyperlipidemia, unspecified: Secondary | ICD-10-CM

## 2022-12-31 DIAGNOSIS — R55 Syncope and collapse: Secondary | ICD-10-CM

## 2022-12-31 DIAGNOSIS — I10 Essential (primary) hypertension: Secondary | ICD-10-CM

## 2022-12-31 NOTE — Telephone Encounter (Signed)
Dr. Curt Bears agreeable to PCP follow up

## 2022-12-31 NOTE — Progress Notes (Signed)
Virtual Visit via Telephone Note   Because of Diesha Bullough Merrihew's co-morbid illnesses, she is at least at moderate risk for complications without adequate follow up.  This format is felt to be most appropriate for this patient at this time.  The patient did not have access to video technology/had technical difficulties with video requiring transitioning to audio format only (telephone).  All issues noted in this document were discussed and addressed.  No physical exam could be performed with this format.  Please refer to the patient's chart for her consent to telehealth for [facility].   Date:  12/31/2022   ID:  Margreta Journey, DOB 20-Oct-1931, MRN [id number]  Patient Location: Other:  Daughter's home/ Provider Location: Home Office  PCP:  Cassandria Anger, MD  Cardiologist:  Quay Burow, MD  Electrophysiologist:  None   Evaluation Performed:  Follow-Up Visit  89 year old female with history of chronic syncope, hypertension, hyperlipidemia, and dyspnea on exertion.  Event monitor revealing PVCs with a normal echo initially.  Unfortunately she had a syncopal episode in April 2023 and she was found to have documented atrial fibrillation with variable ventricular response and converted to normal sinus rhythm when she arrived to ED.    She has been followed since in the atrial fibrillation clinic and was continued on Eliquis.  Repeat event monitor in the setting of a second episode of syncope revealed tachybradycardia syndrome with A-fib with fast and slow ventricular response as well as SVT.  She was referred to EP.  She saw Dr. Curt Bears was started on amiodarone 200 mg twice daily and then 200 mg daily thereafter.  He felt that her near syncope and syncopal episodes were orthostatic.  ILR implant was considered.   The patient does have symptoms concerning for COVID-19 infection (fever, chills, cough, or new shortness of breath).   Telephone visit:  The patient had another  syncopal episode on 12/30/2022, fracturing her coccyx and injury right elbow with x-ray revealing mild chronic degenerative changes without acute fracture or dislocation.  She was seen at [place] [facility] Blood glucose was done, with a result of 120.  There are no further notes to review or labs available.  She is accompanied on the phone by her daughters and son during this visit.  Mrs. Fares does not have memory of her fall.  Her daughter who is speaking states that she has been more tired lately, staying in the bed more, and has not been very active.  They are a building and add onto her son's home for place for her to stay there as she currently is by herself.  They do have cameras watching her and people checking her daily. They also state that she has not been eating and drinking well because she is is sleeping so much.  Luckily, they do have blood pressure readings which they provide with a blood pressure of 160/75, O2 sat was 97%, pulse was 46.  I do not have vital signs from ED visit on 12/30/2022.  Mrs. Kirgan is speaking to me on the phone and is able to give me her date of birth.     Past Medical History:  Diagnosis Date   A-fib ([place])    Allergic rhinitis, cause unspecified    Diabetes mellitus without complication ([place])    borderline"never on meds"   Disturbances of sensation of smell and taste    Dizziness    Headache(784.0)    has decreased to none in later years  Insomnia, unspecified    Other and unspecified hyperlipidemia    Personal history of other diseases of digestive system    Scarlet fever    Unspecified essential hypertension    Metoprolol decreased due to drop in BP readings.   Unspecified sinusitis (chronic)    Whooping cough, unspecified organism    Past Surgical History:  Procedure Laterality Date   ABDOMINAL HYSTERECTOMY     ANAL RECTAL MANOMETRY N/A 07/07/2015   Procedure: ANO RECTAL MANOMETRY;  Surgeon: Garlan Fair, MD;  Location: [facility];  Service:  Endoscopy;  Laterality: N/A;   BILATERAL OOPHORECTOMY     CATARACT EXTRACTION, BILATERAL     COLONOSCOPY W/ POLYPECTOMY     COLONOSCOPY WITH PROPOFOL N/A 05/29/2015   Procedure: COLONOSCOPY WITH PROPOFOL;  Surgeon: Garlan Fair, MD;  Location: [facility];  Service: Endoscopy;  Laterality: N/A;   correction of hammertoe deformity     CYSTOSCOPY     w/ stenting of both ureters   HEMORRHOID SURGERY     hydrosalpinx diagnostic laparoscopy     TONSILLECTOMY  age 62   vericose vein surgery       Current Meds  Medication Sig   amiodarone (PACERONE) 200 MG tablet Take 1 tablet (200 mg total) by mouth daily.   apixaban (ELIQUIS) 2.5 MG TABS tablet Take 1 tablet (2.5 mg total) by mouth 2 (two) times daily.   aspirin-acetaminophen-caffeine (EXCEDRIN MIGRAINE) 250-250-65 MG tablet Take 2 tablets by mouth as needed for headache. Pt takes 250-65 mg as needed.   Cholecalciferol (VITAMIN D-3) 25 MCG (1000 UT) CAPS Take 1,000 Units by mouth daily.   Multiple Vitamins-Minerals (ONE-A-DAY WOMENS 50+) TABS Take 1 tablet by mouth daily with breakfast.   Omega-3 Fatty Acids (FISH OIL PO) Take 1,000 mg by mouth every morning.   Polyvinyl Alcohol-Povidone (REFRESH OP) Place 1-2 drops into both eyes daily as needed (dry eyes).   ramipril (ALTACE) 2.5 MG capsule Take 2.5 mg by mouth daily.   rosuvastatin (CRESTOR) 10 MG tablet Take 1 tablet (10 mg total) by mouth daily.   vitamin C (ASCORBIC ACID) 500 MG tablet Take 500 mg by mouth daily.   Wheat Dextrin (BENEFIBER PO) Take by mouth See admin instructions. Mix 1 teaspoonful of powder into 4-8 ounces of water and drink once a day as needed for mild constipation     Allergies:   Aricept [donepezil], Celecoxib, Ciprofloxacin, Cleocin [clindamycin], Clindamycin hcl, Erythromycin, Gabapentin, Meperidine, Meperidine hcl, Morphine, Morphine sulfate, Namenda [memantine], Tetracycline, Triprolidine-pseudoephedrine, Zolpidem, and Latex   Social History   Tobacco  Use   Smoking status: Never   Smokeless tobacco: Never   Tobacco comments:    Never smoke 03/08/22  Substance Use Topics   Alcohol use: No    Alcohol/week: 0.0 standard drinks of alcohol   Drug use: No     Family Hx: The patient's family history includes Cancer in her mother and paternal grandmother; Diabetes in her mother; Hyperlipidemia in her father; Hypertension in her father; Other in her father and mother.  ROS:   Please see the history of present illness.    Sacral pain, pain in the right elbow. More lethargic, she stays in bed more often than out of bed. All other systems reviewed and are negative.   Prior CV studies:   The following studies were reviewed today: Sacrum and Coccyx 12/30/2022 IMPRESSION: Small fracture deformity of indeterminate age along the anterior aspect of the lower sacrum.  Right Elbow 12/30/2022 Mild chronic and  degenerative changes without evidence of acute fracture or dislocation.  Cardiac Monitor 08/05/2022 Patient had a min HR of 46 bpm, max HR of 200 bpm, and avg HR of 75 bpm. Predominant underlying rhythm was Sinus Rhythm. First Degree AV Block was present. Bundle Branch Block/IVCD was present. 80 Supraventricular Tachycardia runs occurred, the run with  the fastest interval lasting 15 mins 57 secs with a max rate of 200 bpm (avg 190 bpm); the run with the fastest interval was also the longest. Atrial Fibrillation occurred (1% burden), ranging from 63-180 bpm (avg of 113 bpm), the longest lasting 2 hours  11 mins with an avg rate of 111 bpm. Isolated SVEs were occasional (4.1%, 30551), SVE Couplets were rare (<1.0%, 1465), and SVE Triplets were rare (<1.0%, 315). Isolated VEs were occasional (3.6%, [id number]), VE Couplets were rare (<1.0%, 484), and VE  Triplets were rare (<1.0%, 4). Ventricular Bigeminy and Trigeminy were present. MD notification criteria for Supraventricular Tachycardia met - report posted prior to notification per account request  (SA).   SR/SB/ST Freq PVCs (4%) Freq long episodes up to 200 BPM SVT Freq runs of Afib with Fast and slow VR Needs ROV to discuss    Echocardiogram 07/08/2022 1. Left ventricular ejection fraction, by estimation, is 60 to 65%. The  left ventricle has normal function. The left ventricle has no regional  wall motion abnormalities. Left ventricular diastolic parameters are  consistent with Grade I diastolic  dysfunction (impaired relaxation).   2. Right ventricular systolic function is normal. The right ventricular  size is normal. There is normal pulmonary artery systolic pressure. The  estimated right ventricular systolic pressure is XX123456 mmHg.   3. The mitral valve is normal in structure. Mild mitral valve  regurgitation.   4. Tricuspid valve regurgitation is mild to moderate.   5. The aortic valve is tricuspid. Aortic valve regurgitation is not  visualized. Aortic valve sclerosis/calcification is present, without any  evidence of aortic stenosis.   6. The inferior vena cava is normal in size with greater than 50%  respiratory variability, suggesting right atrial pressure of 3 mmHg.   Labs/Other Tests and Data Reviewed:    EKG:  No ECG reviewed.  Recent Labs: 03/04/2022: TSH 1.503 03/05/2022: ALT 12; B Natriuretic Peptide 119.2 09/05/2022: BUN 16; Creatinine, Ser 0.68; Hemoglobin 10.2; Magnesium 2.1; Platelets 285; Potassium 4.2; Sodium 140   Recent Lipid Panel Lab Results  Component Value Date/Time   CHOL 126 02/12/2022 05:03 PM   TRIG 147.0 02/12/2022 05:03 PM   HDL 45.10 02/12/2022 05:03 PM   CHOLHDL 3 02/12/2022 05:03 PM   LDLCALC 52 02/12/2022 05:03 PM   LDLDIRECT 84.0 12/11/2019 12:08 PM    Wt Readings from Last 3 Encounters:  12/31/22 101 lb (45.8 kg)  12/30/22 100 lb 15.5 oz (45.8 kg)  12/08/22 101 lb (45.8 kg)     Objective:    Vital Signs:  Ht 5' 1"$  (1.549 m)   Wt 101 lb (45.8 kg)   BMI 19.08 kg/m    VITAL SIGNS:  reviewed GEN:  No acute distress   LIMITED BY TELEPHONE VISIT  ASSESSMENT & PLAN:    Syncopal episode: Likely orthostatic from sedentary activity, staying in the bed longer, with infrequent eating and drinking, despite best efforts of family to take care of her.  Uncertain if PAF was also etiology as she has had syncopal episodes in the past during A-fib rhythm.  What is concerning is that she is on Eliquis and has been  falling.  Probably need to consider stopping this due to the amount of syncopal episodes she is experiencing.            I am having home health nurse practitioner see her as she is unable to make it to the office due to her pain in her coccyx from fx and elbow from recent fall.  They will be drawing a CBC, BMET as  well as getting an EKG in preparation for her being able to come into the office for a visual inspection in 1 week.  This has been discussed with her daughter and other family members who are in agreement with this.  2.  PAF: Currently on amiodarone and had been doing well on 200 mg daily until recently.  On follow-up would likely need to evaluate amiodarone level and EKG.  Her family is saying that she has become more tired and less active wanting to stay in the bed more.  Uncertain if this is being elicited by amiodarone, or thyroid issues on this medication.  Should have a TSH drawn.  3.  Type 2 diabetes: There is some concern about this with her not eating regularly.'s blood glucose was noted to be normal in ED.  She will need to follow-up with her primary care provider for ongoing management and evaluation.  4.  Alzheimer's: May also be contributing to her near syncope and falls along with her failure to thrive.  May need to have discussion about palliative care through PCP, for care and maintenance of current issues.  Will defer to primary care.   Time:   Today, I have spent 45 minutes with the patient with telehealth technology discussing the above problems.     Medication Adjustments/Labs and  Tests Ordered: Current medicines are reviewed at length with the patient today.  Concerns regarding medicines are outlined above.   Tests Ordered: Orders Placed This Encounter  Procedures   Ambulatory referral to Home Health    Medication Changes: No orders of the defined types were placed in this encounter.   Disposition:  Follow up in 1 week(s)  Signed, Phill Myron. West Pugh, ANP, AACC  12/31/2022 12:14 PM    [facility]

## 2022-12-31 NOTE — Progress Notes (Deleted)
Cardiology Clinic Note   Patient Name: Toni Mcmillan Date of Encounter: 12/31/2022  Primary Care Provider:  Cassandria Anger, MD Primary Cardiologist:  Quay Burow, MD  Patient Profile    89 year old female with history of chronic syncope, hypertension, hyperlipidemia, and dyspnea on exertion.  Event monitor revealing PVCs with a normal echo initially.  Unfortunately she had a syncopal episode in April 2023 and she was found to have documented atrial fibrillation with variable ventricular response and converted to normal sinus rhythm when she arrived to ED.  She has been followed since in the atrial fibrillation clinic and was continued on Eliquis.  Repeat event monitor in the setting of a second episode of syncope revealed tachybradycardia syndrome with A-fib with fast and slow ventricular response as well as SVT.  She was referred to EP.  She saw Dr. Curt Bears was started on amiodarone 200 mg twice daily and then 200 mg daily thereafter.  He felt that her near syncope and syncopal episodes were orthostatic.  ILR implant was considered.   Past Medical History    Past Medical History:  Diagnosis Date   A-fib ([place])    Allergic rhinitis, cause unspecified    Diabetes mellitus without complication ([place])    borderline"never on meds"   Disturbances of sensation of smell and taste    Dizziness    Headache(784.0)    has decreased to none in later years   Insomnia, unspecified    Other and unspecified hyperlipidemia    Personal history of other diseases of digestive system    Scarlet fever    Unspecified essential hypertension    Metoprolol decreased due to drop in BP readings.   Unspecified sinusitis (chronic)    Whooping cough, unspecified organism    Past Surgical History:  Procedure Laterality Date   ABDOMINAL HYSTERECTOMY     ANAL RECTAL MANOMETRY N/A 07/07/2015   Procedure: ANO RECTAL MANOMETRY;  Surgeon: Garlan Fair, MD;  [facility]: [facility];  Service:  Endoscopy;  Laterality: N/A;   BILATERAL OOPHORECTOMY     CATARACT EXTRACTION, BILATERAL     COLONOSCOPY W/ POLYPECTOMY     COLONOSCOPY WITH PROPOFOL N/A 05/29/2015   Procedure: COLONOSCOPY WITH PROPOFOL;  Surgeon: Garlan Fair, MD;  Location: [facility];  Service: Endoscopy;  Laterality: N/A;   correction of hammertoe deformity     CYSTOSCOPY     w/ stenting of both ureters   HEMORRHOID SURGERY     hydrosalpinx diagnostic laparoscopy     TONSILLECTOMY  age 26   vericose vein surgery      Allergies  Allergies  Allergen Reactions   Aricept [Donepezil]     Syncope   Celecoxib Other (See Comments)    Reaction not recalled    Ciprofloxacin Swelling and Other (See Comments)    Caused wrist swelling   Cleocin [Clindamycin] Itching   Clindamycin Hcl Itching   Erythromycin Other (See Comments)    GI upset   Gabapentin Other (See Comments)    Made the patient feel not well the next day   Meperidine Itching   Meperidine Hcl Itching   Morphine Itching   Morphine Sulfate Itching   Namenda [Memantine]     sleepy   Tetracycline Other (See Comments)    GI upset   Triprolidine-Pseudoephedrine Other (See Comments)    Reaction not recalled   Zolpidem Other (See Comments)    "Did not have a good sleep"   Latex Itching and Rash    History  of Present Illness    ***  Home Medications    Current Outpatient Medications  Medication Sig Dispense Refill   amiodarone (PACERONE) 200 MG tablet Take 1 tablet (200 mg total) by mouth daily. 90 tablet 3   apixaban (ELIQUIS) 2.5 MG TABS tablet Take 1 tablet (2.5 mg total) by mouth 2 (two) times daily. 60 tablet 3   aspirin-acetaminophen-caffeine (EXCEDRIN MIGRAINE) 250-250-65 MG tablet Take 2 tablets by mouth as needed for headache. Pt takes 250-65 mg as needed.     Cholecalciferol (VITAMIN D-3) 25 MCG (1000 UT) CAPS Take 1,000 Units by mouth daily.     Multiple Vitamins-Minerals (ONE-A-DAY WOMENS 50+) TABS Take 1 tablet by mouth daily  with breakfast.     Omega-3 Fatty Acids (FISH OIL PO) Take 1,000 mg by mouth every morning.     pantoprazole (PROTONIX) 40 MG tablet Take 1 tablet (40 mg total) by mouth daily. 30 tablet 1   Polyvinyl Alcohol-Povidone (REFRESH OP) Place 1-2 drops into both eyes daily as needed (dry eyes).     ramipril (ALTACE) 2.5 MG capsule Take 2.5 mg by mouth daily.     rosuvastatin (CRESTOR) 10 MG tablet Take 1 tablet (10 mg total) by mouth daily. 90 tablet 3   vitamin C (ASCORBIC ACID) 500 MG tablet Take 500 mg by mouth daily.     Wheat Dextrin (BENEFIBER PO) Take by mouth See admin instructions. Mix 1 teaspoonful of powder into 4-8 ounces of water and drink once a day as needed for mild constipation     No current facility-administered medications for this visit.     Family History    Family History  Problem Relation Age of Onset   Cancer Mother        ?   Diabetes Mother    Other Mother        coagulopathy/ ruptured viscus   Hypertension Father    Hyperlipidemia Father        ?   Other Father        CVA   Cancer Paternal Grandmother        breast   She indicated that her mother is deceased. She indicated that her father is deceased. She indicated that the status of her paternal grandmother is unknown. She indicated that both of her daughters are alive. She indicated that only one of her two sons is alive.  Social History    Social History   Socioeconomic History   Marital status: Married    Spouse name: Not on file   Number of children: Not on file   Years of education: Not on file   Highest education level: Not on file  Occupational History   Not on file  Tobacco Use   Smoking status: Never   Smokeless tobacco: Never   Tobacco comments:    Never smoke 03/08/22  Substance and Sexual Activity   Alcohol use: No    Alcohol/week: 0.0 standard drinks of alcohol   Drug use: No   Sexual activity: Not on file  Other Topics Concern   Not on file  Social History Narrative   7  grand children, 4 great-grandchildren   Mostly homemaker, 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, worked for two years in [employer]   Death of step- grandson by suicide- 9-10   Social Determinants of Health   Financial Resource Strain: [place]  (08/25/2022)   Overall Financial Resource Strain (CARDIA)    Difficulty of Paying Living Expenses: Not hard at all  Food Insecurity: No Food Insecurity (08/25/2022)   Hunger Vital Sign    Worried About Running Out of Food in the Last Year: Never true    Ran Out of Food in the Last Year: Never true  Transportation Needs: No Transportation Needs (08/25/2022)   PRAPARE - Hydrologist (Medical): No    Lack of Transportation (Non-Medical): No  Physical Activity: Sufficiently Active (08/25/2022)   Exercise Vital Sign    Days of Exercise per Week: 5 days    Minutes of Exercise per Session: 30 min  Stress: No Stress Concern Present (08/25/2022)   [place]    Feeling of Stress : Not at all  Social Connections: Moderately Integrated (08/25/2022)   Social Connection and Isolation Panel [NHANES]    Frequency of Communication with Friends and Family: More than three times a week    Frequency of Social Gatherings with Friends and Family: More than three times a week    Attends Religious Services: More than 4 times per year    Active Member of [employer] or Organizations: Yes    Attends Archivist Meetings: More than 4 times per year    Marital Status: Widowed  Intimate Partner Violence: Not At Risk (08/25/2022)   Humiliation, Afraid, Rape, and Kick questionnaire    Fear of Current or Ex-Partner: No    Emotionally Abused: No    Physically Abused: No    Sexually Abused: No     Review of Systems    General:  No chills, fever, night sweats or weight changes.  Cardiovascular:  No chest pain, dyspnea on exertion, edema, orthopnea, palpitations, paroxysmal nocturnal  dyspnea. Dermatological: No rash, lesions/masses Respiratory: No cough, dyspnea Urologic: No hematuria, dysuria Abdominal:   No nausea, vomiting, diarrhea, bright red blood per rectum, melena, or hematemesis Neurologic:  No visual changes, wkns, changes in mental status. All other systems reviewed and are otherwise negative except as noted above.     Physical Exam    VS:  There were no vitals taken for this visit. , BMI There is no height or weight on file to calculate BMI.     GEN: Well nourished, well developed, in no acute distress. HEENT: normal. Neck: Supple, no JVD, carotid bruits, or masses. Cardiac: RRR, no murmurs, rubs, or gallops. No clubbing, cyanosis, edema.  Radials/DP/PT 2+ and equal bilaterally.  Respiratory:  Respirations regular and unlabored, clear to auscultation bilaterally. GI: Soft, nontender, nondistended, BS + x 4. MS: no deformity or atrophy. Skin: warm and dry, no rash. Neuro:  Strength and sensation are intact. Psych: Normal affect.  Accessory Clinical Findings    ECG personally reviewed by me today- *** - No acute changes  Lab Results  Component Value Date   WBC 8.8 09/05/2022   HGB 10.2 (L) 09/05/2022   HCT 31.6 (L) 09/05/2022   MCV 94.0 09/05/2022   PLT 285 09/05/2022   Lab Results  Component Value Date   CREATININE 0.68 09/05/2022   BUN 16 09/05/2022   NA 140 09/05/2022   K 4.2 09/05/2022   CL 109 09/05/2022   CO2 24 09/05/2022   Lab Results  Component Value Date   ALT 12 03/05/2022   AST 20 03/05/2022   ALKPHOS 58 03/05/2022   BILITOT 0.9 03/05/2022   Lab Results  Component Value Date   CHOL 126 02/12/2022   HDL 45.10 02/12/2022   LDLCALC 52 02/12/2022   LDLDIRECT 84.0  12/11/2019   TRIG 147.0 02/12/2022   CHOLHDL 3 02/12/2022    Lab Results  Component Value Date   HGBA1C 5.6 09/05/2022    Review of Prior Studies: Other studies Reviewed: Additional studies/ records that were reviewed today include: TTE 07/08/22   Review of the above records today demonstrates:   1. Left ventricular ejection fraction, by estimation, is 60 to 65%. The  left ventricle has normal function. The left ventricle has no regional  wall motion abnormalities. Left ventricular diastolic parameters are  consistent with Grade I diastolic  dysfunction (impaired relaxation).   2. Right ventricular systolic function is normal. The right ventricular  size is normal. There is normal pulmonary artery systolic pressure. The  estimated right ventricular systolic pressure is [id number] mmHg.   3. The mitral valve is normal in structure. Mild mitral valve  regurgitation.   4. Tricuspid valve regurgitation is mild to moderate.   5. The aortic valve is tricuspid. Aortic valve regurgitation is not  visualized. Aortic valve sclerosis/calcification is present, without any  evidence of aortic stenosis.   6. The inferior vena cava is normal in size with greater than 50%  respiratory variability, suggesting right atrial pressure of 3 mmHg.    Cardiac monitor 08/08/2022 personally reviewed SR/SB/ST Freq PVCs (4%) Freq long episodes up to 200 BPM SVT Freq runs of Afib with Fast and slow VR  Assessment & Plan   1.  ***    Current medicines are reviewed at length with the patient today.  I have spent *** min's  dedicated to the care of this patient on the date of this encounter to include pre-visit review of records, assessment, management and diagnostic testing,with shared decision making. Signed, Phill Myron. [place] Pugh, ANP, AACC   12/31/2022 7:22 AM      Office [phone] Fax [phone]  Notice: This dictation was prepared with Dragon dictation along with smaller phrase technology. Any transcriptional errors that result from this process are unintentional and may not be corrected upon review.

## 2023-01-03 ENCOUNTER — Ambulatory Visit: Payer: Medicare Other | Admitting: General Practice

## 2023-01-05 NOTE — Progress Notes (Signed)
Cardiology Clinic Note   Patient Name: Toni Mcmillan Date of Encounter: 01/07/2023  Primary Care Provider:  Cassandria Anger, MD Primary Cardiologist:  Quay Burow, MD  Patient Profile    Toni Mcmillan 89 year old female presents the clinic today for follow-up evaluation of her paroxysmal atrial fibrillation, syncope and essential hypertension.   Past Medical History    Past Medical History:  Diagnosis Date   A-fib ([place])    Allergic rhinitis, cause unspecified    Diabetes mellitus without complication ([place])    borderline"never on meds"   Disturbances of sensation of smell and taste    Dizziness    Headache(784.0)    has decreased to none in later years   Insomnia, unspecified    Other and unspecified hyperlipidemia    Personal history of other diseases of digestive system    Scarlet fever    Unspecified essential hypertension    Metoprolol decreased due to drop in BP readings.   Unspecified sinusitis (chronic)    Whooping cough, unspecified organism    Past Surgical History:  Procedure Laterality Date   ABDOMINAL HYSTERECTOMY     ANAL RECTAL MANOMETRY N/A 07/07/2015   Procedure: ANO RECTAL MANOMETRY;  Surgeon: Garlan Fair, MD;  Location: [facility];  Service: Endoscopy;  Laterality: N/A;   BILATERAL OOPHORECTOMY     CATARACT EXTRACTION, BILATERAL     COLONOSCOPY W/ POLYPECTOMY     COLONOSCOPY WITH PROPOFOL N/A 05/29/2015   Procedure: COLONOSCOPY WITH PROPOFOL;  Surgeon: Garlan Fair, MD;  Location: [facility];  Service: Endoscopy;  Laterality: N/A;   correction of hammertoe deformity     CYSTOSCOPY     w/ stenting of both ureters   HEMORRHOID SURGERY     hydrosalpinx diagnostic laparoscopy     TONSILLECTOMY  age 67   vericose vein surgery      Allergies  Allergies  Allergen Reactions   Aricept [Donepezil]     Syncope   Celecoxib Other (See Comments)    Reaction not recalled    Ciprofloxacin Swelling and Other (See Comments)     Caused wrist swelling   Cleocin [Clindamycin] Itching   Clindamycin Hcl Itching   Erythromycin Other (See Comments)    GI upset   Gabapentin Other (See Comments)    Made the patient feel not well the next day   Meperidine Itching   Meperidine Hcl Itching   Morphine Itching   Morphine Sulfate Itching   Namenda [Memantine]     sleepy   Tetracycline Other (See Comments)    GI upset   Triprolidine-Pseudoephedrine Other (See Comments)    Reaction not recalled   Zolpidem Other (See Comments)    "Did not have a good sleep"   Latex Itching and Rash    History of Present Illness    Toni Mcmillan has a PMH of chronic syncope, HTN, hyperlipidemia, and DOE.  She wore a cardiac event monitor which showed PVCs.  She had a normal echocardiogram.  She had syncopal event 4/23 and was found to have atrial fibrillation with variable ventricular response.  She converted to sinus rhythm when she arrived to the emergency department.  She was followed by the atrial fibrillation clinic.  She was continued on apixaban.  She wore a repeat cardiac event monitor due to subsequent syncopal event which showed tachybradycardia syndrome, atrial fibrillation with fast and slow ventricular response as well as SVT.  She was referred to EP.  She was seen by  Dr. Curt Bears and started on amiodarone 200 mg twice daily and then 200 mg daily.  It was felt that her near syncope and syncopal episodes more orthostatic.  ILR was considered.  She was contacted via telephone by Bunnie Domino, DNP on 12/31/2022.  She reported a syncopal episode 12/30/2022.  She fractured her coccyx and injured her right elbow.  X-ray showed mild chronic degenerative changes without acute fracture or dislocation.  She was seen in the [place] emergency department.  Her glucose at that time was noted to be 120.  She was accompanied on the phone by her daughters and son.  The patient did not have memory of her follow-up.  Daughter reported that  her mother seemed more tired and was staying in bed more.  She was not very active.  Plan was to add onto her son's house for her to stay with her son.  She was living by herself at the time of the call.  They reported that they were cameras watching her and people checking in on her daily.  They reported that she had not been eating or drinking as well due to increased amount of sleep.  Her blood pressure was noted to be 160/72, pulse 46.  Patient was able to give her  date of birth.  Follow-up was planned for 1 week.  She presents to the today for follow-up evaluation and states she feels okay today.  She does note some pain in her buttocks region.  We reviewed her recent fall and health history.  I reviewed her CHA2DS2-VASc score.  She and her daughter expressed understanding.  She does have some memory impairment.  She repeated details about the fall several times.  Daughter reports that they reviewed camera footage and her interpretation of the event is an accurate.  Her blood pressure initially today is 158/74 on recheck is 138/82.  I will order CBC today.  I instructed her to discuss options for discontinuing/continuing apixaban with her sister and brother.  They will respond via MyChart.  We will plan follow-up in 3 months.  Today she denies chest pain, shortness of breath, lower extremity edema, fatigue, palpitations, melena, hematuria, hemoptysis, diaphoresis, weakness, presyncope, syncope, orthopnea, and PND.    Home Medications    Prior to Admission medications   Medication Sig Start Date End Date Taking? Authorizing Provider  amiodarone (PACERONE) 200 MG tablet Take 1 tablet (200 mg total) by mouth daily. 11/04/22   Camnitz, Ocie Doyne, MD  apixaban (ELIQUIS) 2.5 MG TABS tablet Take 1 tablet (2.5 mg total) by mouth 2 (two) times daily. 03/08/22   Fenton, Clint R, PA  aspirin-acetaminophen-caffeine (EXCEDRIN MIGRAINE) [phone] MG tablet Take 2 tablets by mouth as needed for headache. Pt  takes 250-65 mg as needed.    [provider]  Cholecalciferol (VITAMIN D-3) 25 MCG (1000 UT) CAPS Take 1,000 Units by mouth daily.    [provider]  Multiple Vitamins-Minerals (ONE-A-DAY WOMENS 50+) TABS Take 1 tablet by mouth daily with breakfast.    [provider]  Omega-3 Fatty Acids (FISH OIL PO) Take 1,000 mg by mouth every morning.    [provider]  pantoprazole (PROTONIX) 40 MG tablet Take 1 tablet (40 mg total) by mouth daily. 09/05/22 11/04/22  Elodia Florence., MD  Polyvinyl Alcohol-Povidone (REFRESH OP) Place 1-2 drops into both eyes daily as needed (dry eyes).    [provider]  ramipril (ALTACE) 2.5 MG capsule Take 2.5 mg by mouth daily. 10/14/22  [provider]  rosuvastatin (CRESTOR) 10 MG tablet Take 1 tablet (10 mg total) by mouth daily. 09/07/22   Lorretta Harp, MD  vitamin C (ASCORBIC ACID) 500 MG tablet Take 500 mg by mouth daily.    [provider]  Wheat Dextrin (BENEFIBER PO) Take by mouth See admin instructions. Mix 1 teaspoonful of powder into 4-8 ounces of water and drink once a day as needed for mild constipation    [provider]    Family History    Family History  Problem Relation Age of Onset   Cancer Mother        ?   Diabetes Mother    Other Mother        coagulopathy/ ruptured viscus   Hypertension Father    Hyperlipidemia Father        ?   Other Father        CVA   Cancer Paternal Grandmother        breast   She indicated that her mother is deceased. She indicated that her father is deceased. She indicated that the status of her paternal grandmother is unknown. She indicated that both of her daughters are alive. She indicated that only one of her two sons is alive.  Social History    Social History   Socioeconomic History   Marital status: Married    Spouse name: Not on file   Number of children: Not on file   Years of education: Not on file   Highest  education level: Not on file  Occupational History   Not on file  Tobacco Use   Smoking status: Never   Smokeless tobacco: Never   Tobacco comments:    Never smoke 03/08/22  Substance and Sexual Activity   Alcohol use: No    Alcohol/week: 0.0 standard drinks of alcohol   Drug use: No   Sexual activity: Not on file  Other Topics Concern   Not on file  Social History Narrative   7 grand children, 4 great-grandchildren   Mostly homemaker, 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, worked for two years in [employer]   Death of step- grandson by suicide- 9-10   Social Determinants of Health   Financial Resource Strain: Low Risk  (08/25/2022)   Overall Financial Resource Strain (CARDIA)    Difficulty of Paying Living Expenses: Not hard at all  Food Insecurity: No Food Insecurity (08/25/2022)   Hunger Vital Sign    Worried About Running Out of Food in the Last Year: Never true    [place] in the Last Year: Never true  Transportation Needs: No Transportation Needs (08/25/2022)   PRAPARE - Hydrologist (Medical): No    Lack of Transportation (Non-Medical): No  Physical Activity: Sufficiently Active (08/25/2022)   Exercise Vital Sign    Days of Exercise per Week: 5 days    Minutes of Exercise per Session: 30 min  Stress: No Stress Concern Present (08/25/2022)   [place]    Feeling of Stress : Not at all  Social Connections: Moderately Integrated (08/25/2022)   Social Connection and Isolation Panel [NHANES]    Frequency of Communication with Friends and Family: More than three times a week    Frequency of Social Gatherings with Friends and Family: More than three times a week    Attends Religious Services: More than 4 times per year    Active Member of [employer]  or Organizations: Yes    Attends Archivist Meetings: More than 4 times per year    Marital Status: Widowed  Intimate Partner  Violence: Not At Risk (08/25/2022)   Humiliation, Afraid, Rape, and Kick questionnaire    Fear of Current or Ex-Partner: No    Emotionally Abused: No    Physically Abused: No    Sexually Abused: No     Review of Systems    General:  No chills, fever, night sweats or weight changes.  Cardiovascular:  No chest pain, dyspnea on exertion, edema, orthopnea, palpitations, paroxysmal nocturnal dyspnea. Dermatological: No rash, lesions/masses Respiratory: No cough, dyspnea Urologic: No hematuria, dysuria Abdominal:   No nausea, vomiting, diarrhea, bright red blood per rectum, melena, or hematemesis Neurologic:  No visual changes, wkns, changes in mental status. All other systems reviewed and are otherwise negative except as noted above.  Physical Exam    VS:  BP 138/82   Pulse (!) 58   Ht '5\' 1"'$  (1.549 m)   Wt 102 lb 12.8 oz (46.6 kg)   SpO2 (!) 88%   BMI 19.42 kg/m  , BMI Body mass index is 19.42 kg/m. GEN: Well nourished, well developed, in no acute distress. HEENT: normal. Neck: Supple, no JVD, carotid bruits, or masses. Cardiac: RRR, no murmurs, rubs, or gallops. No clubbing, cyanosis, edema.  Radials/DP/PT 2+ and equal bilaterally.  Respiratory:  Respirations regular and unlabored, clear to auscultation bilaterally. GI: Soft, nontender, nondistended, BS + x 4. MS: no deformity or atrophy. Skin: warm and dry, no rash.  Contusion right elbow Neuro:  Strength and sensation are intact. Psych: Normal affect.  Accessory Clinical Findings    Recent Labs: 03/04/2022: TSH 1.503 03/05/2022: ALT 12; B Natriuretic Peptide 119.2 09/05/2022: BUN 16; Creatinine, Ser 0.68; Hemoglobin 10.2; Magnesium 2.1; Platelets 285; Potassium 4.2; Sodium 140   Recent Lipid Panel    Component Value Date/Time   CHOL 126 02/12/2022 1703   TRIG 147.0 02/12/2022 1703   HDL 45.10 02/12/2022 1703   CHOLHDL 3 02/12/2022 1703   VLDL 29.4 02/12/2022 1703   LDLCALC 52 02/12/2022 1703   LDLDIRECT 84.0  12/11/2019 1208         ECG personally reviewed by me today-sinus bradycardia left axis deviation 58 bpm- No acute changes  Cardiac event monitor 08/05/2022 Patient had a min HR of 46 bpm, max HR of 200 bpm, and avg HR of 75 bpm. Predominant underlying rhythm was Sinus Rhythm. First Degree AV Block was present. Bundle Branch Block/IVCD was present. 80 Supraventricular Tachycardia runs occurred, the run with  the fastest interval lasting 15 mins 57 secs with a max rate of 200 bpm (avg 190 bpm); the run with the fastest interval was also the longest. Atrial Fibrillation occurred (1% burden), ranging from 63-180 bpm (avg of 113 bpm), the longest lasting 2 hours  11 mins with an avg rate of 111 bpm. Isolated SVEs were occasional (4.1%, 30551), SVE Couplets were rare (<1.0%, 1465), and SVE Triplets were rare (<1.0%, 315). Isolated VEs were occasional (3.6%, [id number]), VE Couplets were rare (<1.0%, 484), and VE  Triplets were rare (<1.0%, 4). Ventricular Bigeminy and Trigeminy were present. MD notification criteria for Supraventricular Tachycardia met - report posted prior to notification per account request (SA).   SR/SB/ST Freq PVCs (4%) Freq long episodes up to 200 BPM SVT Freq runs of Afib with Fast and slow VR Needs ROV to discuss    Echocardiogram 07/08/2022 1. Left ventricular ejection fraction, by estimation,  is 60 to 65%. The  left ventricle has normal function. The left ventricle has no regional  wall motion abnormalities. Left ventricular diastolic parameters are  consistent with Grade I diastolic  dysfunction (impaired relaxation).   2. Right ventricular systolic function is normal. The right ventricular  size is normal. There is normal pulmonary artery systolic pressure. The  estimated right ventricular systolic pressure is [id number] mmHg.   3. The mitral valve is normal in structure. Mild mitral valve  regurgitation.   4. Tricuspid valve regurgitation is mild to moderate.   5. The  aortic valve is tricuspid. Aortic valve regurgitation is not  visualized. Aortic valve sclerosis/calcification is present, without any  evidence of aortic stenosis.   6. The inferior vena cava is normal in size with greater than 50%  respiratory variability, suggesting right atrial pressure of 3 mmHg.   Assessment & Plan   1.  Paroxysmal atrial fibrillation-heart rate today 58 bpm.  Due to recent fall and chronic syncope shared decision making was used to decide to review apixaban.  Daughter would like to discuss with her sister and brother.  Instructed them to send [place] a message/call with their decision.  Details about stroke risk provided.  Fall precautions. Continue amiodarone Avoid triggers caffeine, chocolate, EtOH, dehydration etc. Order CBC  This patients CHA2DS2-VASc Score and unadjusted Ischemic Stroke Rate (% per year) is equal to 9.7 % stroke rate/year from a score of 6 [CHF, HTN, Aortic Plaque, Female, age x 2 ]   Essential hypertension-BP today 158/74 and on repeat 138/82.  Continue ramipril Heart healthy low-sodium diet-salty 6 given Increase physical activity as tolerated Order BMP  Hyperlipidemia-LDL 52 on 02/12/2022 Continue rosuvastatin Heart healthy low-sodium high-fiber diet Increase physical activity as tolerated Order direct LDL  Syncope-chronic in nature.  No falls since 12/30/2022.  Healing well.  Planning to move with son once addition has been completed. Change positions slowly. Maintain p.o. hydration  Disposition: Follow-up with Dr.Berry in 3 months.   Jossie Ng. Baltasar Twilley NP-C     01/07/2023, 3:19 PM [facility] [address] Office [phone] Fax [phone]    I spent 15 minutes examining this patient, reviewing medications, and using patient centered shared decision making involving her cardiac care.  Prior to her visit I spent greater than 20 minutes reviewing her past medical history,  medications, and  prior cardiac tests.

## 2023-01-07 ENCOUNTER — Ambulatory Visit: Payer: Medicare Other | Attending: General Practice | Admitting: General Practice

## 2023-01-07 ENCOUNTER — Encounter: Payer: Self-pay | Admitting: General Practice

## 2023-01-07 VITALS — BP 138/82 | HR 58 | Ht 61.0 in | Wt 102.8 lb

## 2023-01-07 DIAGNOSIS — R55 Syncope and collapse: Secondary | ICD-10-CM | POA: Diagnosis not present

## 2023-01-07 DIAGNOSIS — E785 Hyperlipidemia, unspecified: Secondary | ICD-10-CM | POA: Diagnosis not present

## 2023-01-07 DIAGNOSIS — I480 Paroxysmal atrial fibrillation: Secondary | ICD-10-CM

## 2023-01-07 DIAGNOSIS — I10 Essential (primary) hypertension: Secondary | ICD-10-CM

## 2023-01-07 NOTE — Patient Instructions (Signed)
Medication Instructions:  The current medical regimen is effective;  continue present plan and medications as directed. Please refer to the Current Medication list given to you today.  *If you need a refill on your cardiac medications before your next appointment, please call your pharmacy*  Lab Work: CBC,BMET AND DIR LDL TODAY If you have labs (blood work) drawn today and your tests are completely normal, you will receive your results only by: [place] (if you have MyChart) OR  A paper copy in the mail If you have any lab test that is abnormal or we need to change your treatment, we will call you to review the results.  Testing/Procedures: NONE  Follow-Up: At [facility], you and your health needs are our priority.  As part of our continuing mission to provide you with exceptional heart care, we have created designated Provider Care Teams.  These Care Teams include your primary Cardiologist (physician) and Advanced Practice Providers (APPs -  Physician Assistants and Nurse Practitioners) who all work together to provide you with the care you need, when you need it.  Your next appointment:   3 month(s)  Provider:   Quay Burow, MD     Other Instructions PLEASE SEND [place] A MYCHART MESSAGE ABOUT YOUR DECISION  ABOUT [facility]

## 2023-01-08 DIAGNOSIS — I480 Paroxysmal atrial fibrillation: Secondary | ICD-10-CM

## 2023-01-08 DIAGNOSIS — E785 Hyperlipidemia, unspecified: Secondary | ICD-10-CM

## 2023-01-08 DIAGNOSIS — I10 Essential (primary) hypertension: Secondary | ICD-10-CM

## 2023-01-08 LAB — CBC
Hematocrit: 35.3 % (ref 34.0–46.6)
Hemoglobin: 11.4 g/dL (ref 11.1–15.9)
MCH: 30 pg (ref 26.6–33.0)
MCHC: 32.3 g/dL (ref 31.5–35.7)
MCV: 93 fL (ref 79–97)
Platelets: 355 10*3/uL (ref 150–450)
RBC: 3.8 x10E6/uL (ref 3.77–5.28)
RDW: 13.2 % (ref 11.7–15.4)
WBC: 7.7 10*3/uL (ref 3.4–10.8)

## 2023-01-08 LAB — BASIC METABOLIC PANEL
BUN/Creatinine Ratio: 19 (ref 12–28)
BUN: 16 mg/dL (ref 8–27)
CO2: 25 mmol/L (ref 20–29)
Calcium: 10 mg/dL (ref 8.7–10.3)
Chloride: 102 mmol/L (ref 96–106)
Creatinine, Ser: 0.84 mg/dL (ref 0.57–1.00)
Glucose: 105 mg/dL — ABNORMAL HIGH (ref 70–99)
Potassium: 5.6 mmol/L — ABNORMAL HIGH (ref 3.5–5.2)
Sodium: 142 mmol/L (ref 134–144)
eGFR: 66 mL/min/{1.73_m2} (ref >59)

## 2023-01-08 LAB — LDL CHOLESTEROL, DIRECT: LDL Direct: 50 mg/dL (ref 0–99)

## 2023-01-11 ENCOUNTER — Other Ambulatory Visit: Payer: Self-pay

## 2023-01-11 DIAGNOSIS — I480 Paroxysmal atrial fibrillation: Secondary | ICD-10-CM

## 2023-01-11 DIAGNOSIS — E785 Hyperlipidemia, unspecified: Secondary | ICD-10-CM

## 2023-01-11 DIAGNOSIS — I10 Essential (primary) hypertension: Secondary | ICD-10-CM

## 2023-01-12 LAB — BASIC METABOLIC PANEL
BUN/Creatinine Ratio: 22 (ref 12–28)
BUN: 19 mg/dL (ref 8–27)
CO2: 25 mmol/L (ref 20–29)
Calcium: 9.6 mg/dL (ref 8.7–10.3)
Chloride: 102 mmol/L (ref 96–106)
Creatinine, Ser: 0.88 mg/dL (ref 0.57–1.00)
Glucose: 103 mg/dL — ABNORMAL HIGH (ref 70–99)
Potassium: 5.1 mmol/L (ref 3.5–5.2)
Sodium: 140 mmol/L (ref 134–144)
eGFR: 63 mL/min/{1.73_m2} (ref >59)

## 2023-01-13 ENCOUNTER — Other Ambulatory Visit: Payer: Self-pay | Admitting: Internal Medicine

## 2023-01-20 ENCOUNTER — Telehealth: Payer: Self-pay | Admitting: Cardiovascular Disease

## 2023-01-20 NOTE — Telephone Encounter (Signed)
Called pt's daughter. No answer, left message for her to return the call.

## 2023-01-20 NOTE — Telephone Encounter (Signed)
Follow Up:      Patient is returning call from today. 

## 2023-01-20 NOTE — Telephone Encounter (Signed)
Pt c/o BP issue: STAT if pt c/o blurred vision, one-sided weakness or slurred speech  1. What are your last 5 BP readings? 172/112 two and a half hrs ago 164/98 one hr ago  2. Are you having any other symptoms (ex. Dizziness, headache, blurred vision, passed out)? Very bad headache last night, this morning her headache was better  3. What is your BP issue? Pt's daughter called worried about how high the pt's BP readings are

## 2023-01-20 NOTE — Telephone Encounter (Signed)
Called pt's daughter she states pt's blood pressure was really high today. Pt was c/o of a very bad headache last night but these pressures were after the headache. Pt feels better currently. Pt's daughter advised to start taking her mother's blood pressure daily, she verbalized understanding. She is concerned about her mother having to possibly take medications for her blood pressure again. She states her mother stays in the bed most of day and goes to be early so her taking blood pressure medications twice daily might be an issue. However her taking blood pressure medication once is no problem.

## 2023-01-24 NOTE — Telephone Encounter (Signed)
Called pt's daughter. Left message on her machine to relay Jesse's message.

## 2023-01-25 ENCOUNTER — Encounter: Payer: Self-pay | Admitting: Cardiology

## 2023-01-27 ENCOUNTER — Encounter: Payer: Self-pay | Admitting: Internal Medicine

## 2023-01-28 MED ORDER — PANTOPRAZOLE SODIUM 40 MG PO TBEC: 40.0000 mg | DELAYED_RELEASE_TABLET | Freq: Every day | ORAL | 1 refills | Status: DC

## 2023-02-13 IMAGING — DX DG THORACIC SPINE 2V
2 series · 2 of 2 positions shown · non-contrast
Comparison: CT chest 03/05/2022.  Thoracic spine series 12/11/2019.

CLINICAL DATA: Midthoracic spine pain.

EXAM:
THORACIC SPINE 2 VIEWS

[t-spine ap]
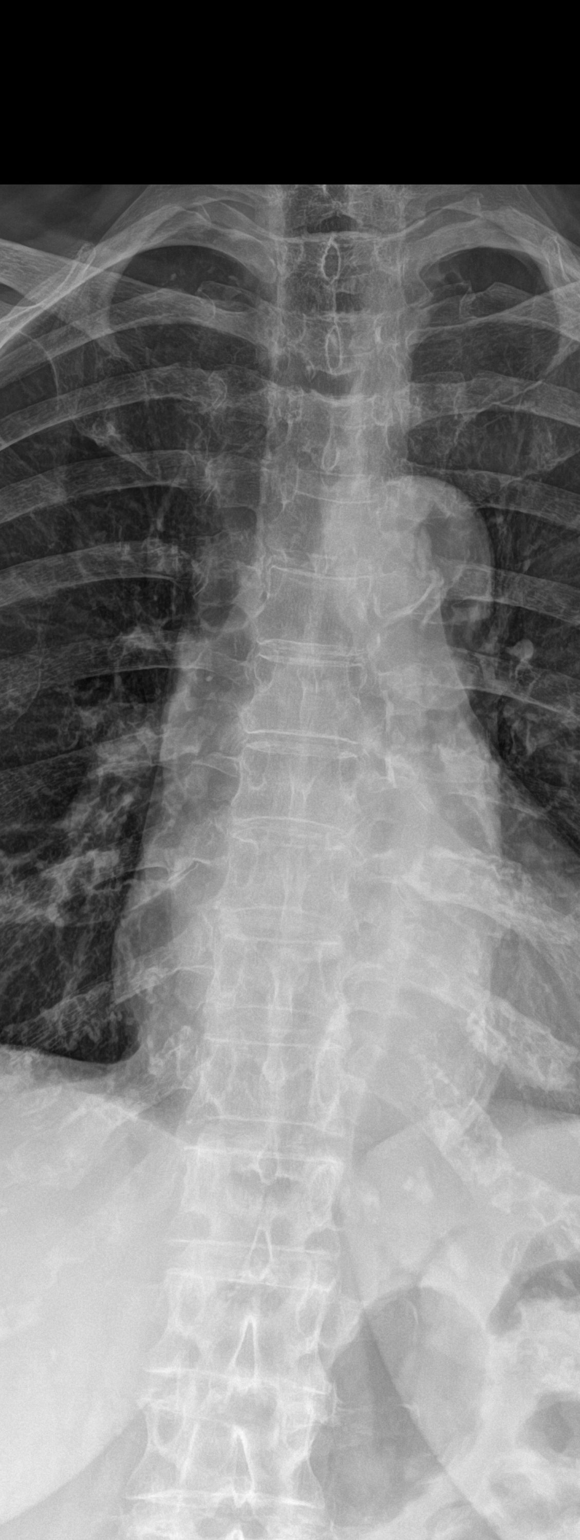

[t-spine lat]
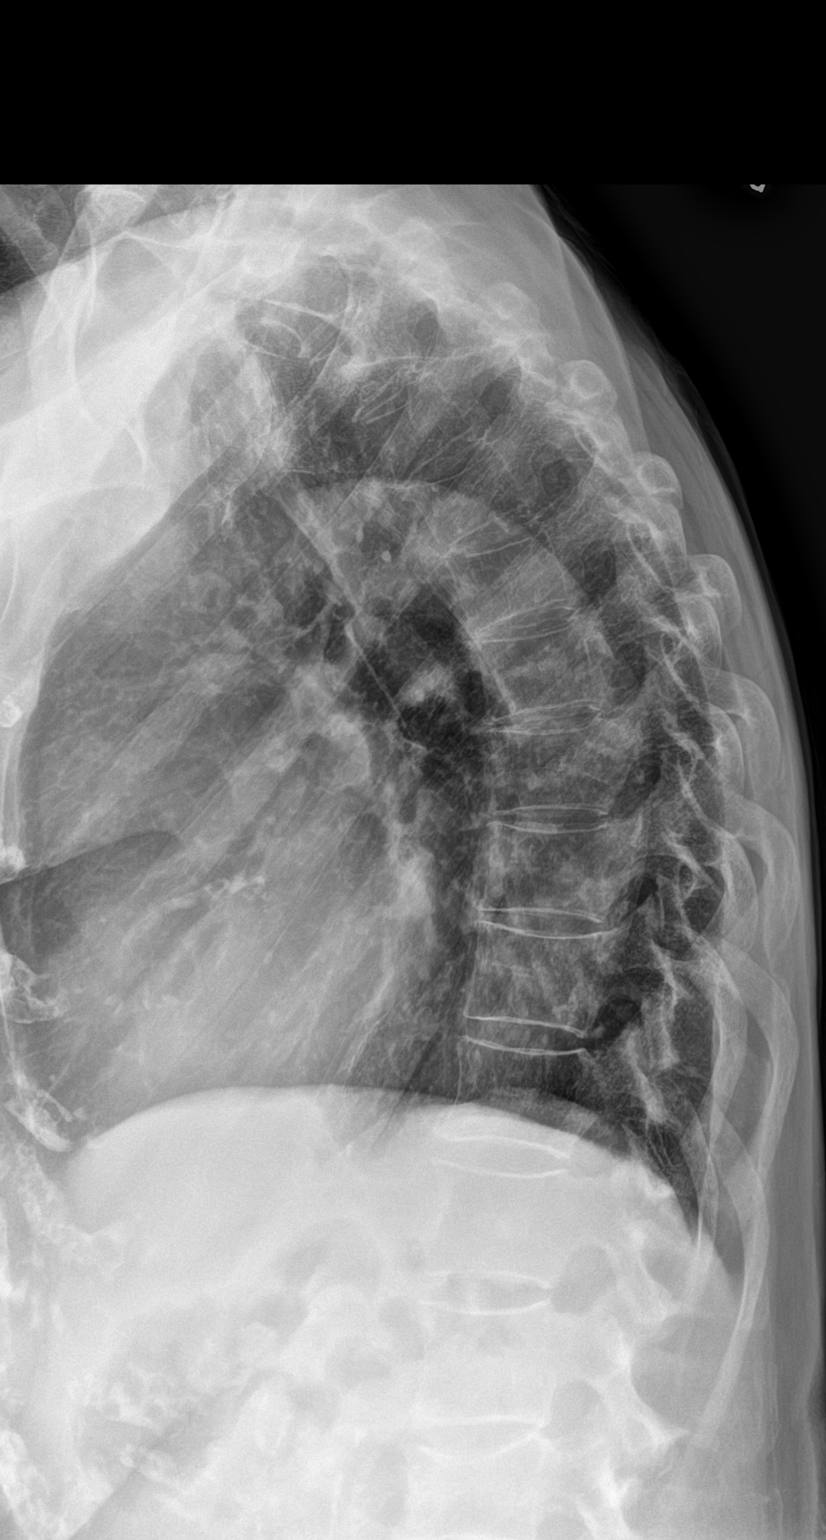

[2 of 2 positions shown; findings below may reference images not displayed]

FINDINGS: Diffuse osteopenia. Mild multilevel degenerative change. Mild upper
lumbar compression, stable from prior exam. Thoracic spine is
intact. No evidence of thoracic vertebral fracture.
IMPRESSION: Diffuse osteopenia degenerative change. Old stable upper lumbar
compression fracture. No acute abnormality identified.

## 2023-03-25 ENCOUNTER — Encounter: Payer: Self-pay | Admitting: Cardiology

## 2023-03-25 ENCOUNTER — Ambulatory Visit: Payer: Medicare Other | Attending: Cardiology | Admitting: Cardiology

## 2023-03-25 VITALS — BP 140/82 | HR 70 | Ht 61.0 in | Wt 105.0 lb

## 2023-03-25 DIAGNOSIS — I480 Paroxysmal atrial fibrillation: Secondary | ICD-10-CM | POA: Diagnosis not present

## 2023-03-25 DIAGNOSIS — I471 Supraventricular tachycardia, unspecified: Secondary | ICD-10-CM

## 2023-03-25 DIAGNOSIS — D6869 Other thrombophilia: Secondary | ICD-10-CM | POA: Diagnosis not present

## 2023-03-25 NOTE — Progress Notes (Signed)
Electrophysiology Office Note   Date:  03/25/2023   ID:  Toni Mcmillan, DOB 05/15/1931, MRN [id number]  PCP:  Tresa Garter, MD  Cardiologist:  Allyson Sabal Primary Electrophysiologist:  Deondrick Searls Jorja Loa, MD    Chief Complaint: AF/SVT   History of Present Illness: Toni Mcmillan is a 89 y.o. female who is being seen today for the evaluation of AF/SVT at the request of Plotnikov, Georgina Quint, MD. Presenting today for electrophysiology evaluation.  She has a history of significant hypertension, hyperlipidemia, atrial fibrillation, SVT.  She presented to the hospital with lightheadedness and visual changes.  She was having multiple episodes of syncope.  Was determined that this was due to orthostasis.  She wore a cardiac monitor that showed episodes of atrial fibrillation and SVT.  She was started on amiodarone with improvement in her symptoms.  3 months after amiodarone started, she started to become much more fatigued and weak.  Her symptoms returned in the mornings.  Today, denies symptoms of palpitations, chest pain, shortness of breath, orthopnea, PND, lower extremity edema, claudication, dizziness, presyncope, syncope, bleeding, or neurologic sequela. The patient is tolerating medications without difficulties.   Past Medical History:  Diagnosis Date   A-fib (HCC)    Allergic rhinitis, cause unspecified    Diabetes mellitus without complication (HCC)    borderline"never on meds"   Disturbances of sensation of smell and taste    Dizziness    Headache(784.0)    has decreased to none in later years   Insomnia, unspecified    Other and unspecified hyperlipidemia    Personal history of other diseases of digestive system    Scarlet fever    Unspecified essential hypertension    Metoprolol decreased due to drop in BP readings.   Unspecified sinusitis (chronic)    Whooping cough, unspecified organism    Past Surgical History:  Procedure Laterality Date   ABDOMINAL  HYSTERECTOMY     ANAL RECTAL MANOMETRY N/A 07/07/2015   Procedure: ANO RECTAL MANOMETRY;  Surgeon: Charolett Bumpers, MD;  Location: [facility];  Service: Endoscopy;  Laterality: N/A;   BILATERAL OOPHORECTOMY     CATARACT EXTRACTION, BILATERAL     COLONOSCOPY W/ POLYPECTOMY     COLONOSCOPY WITH PROPOFOL N/A 05/29/2015   Procedure: COLONOSCOPY WITH PROPOFOL;  Surgeon: Charolett Bumpers, MD;  Location: [facility];  Service: Endoscopy;  Laterality: N/A;   correction of hammertoe deformity     CYSTOSCOPY     w/ stenting of both ureters   HEMORRHOID SURGERY     hydrosalpinx diagnostic laparoscopy     TONSILLECTOMY  age 4   vericose vein surgery       Current Outpatient Medications  Medication Sig Dispense Refill   amiodarone (PACERONE) 200 MG tablet Take 1 tablet (200 mg total) by mouth daily. 90 tablet 3   aspirin-acetaminophen-caffeine (EXCEDRIN MIGRAINE) 250-250-65 MG tablet Take 2 tablets by mouth as needed for headache. Pt takes 250-65 mg as needed.     Cholecalciferol (VITAMIN D-3) 25 MCG (1000 UT) CAPS Take 1,000 Units by mouth daily.     Multiple Vitamins-Minerals (ONE-A-DAY WOMENS 50+) TABS Take 1 tablet by mouth daily with breakfast.     Omega-3 Fatty Acids (FISH OIL PO) Take 1,000 mg by mouth every morning.     pantoprazole (PROTONIX) 40 MG tablet Take 1 tablet (40 mg total) by mouth daily. 30 tablet 1   Polyvinyl Alcohol-Povidone (REFRESH OP) Place 1-2 drops into both eyes daily as needed (dry  eyes).     rosuvastatin (CRESTOR) 10 MG tablet Take 1 tablet (10 mg total) by mouth daily. 90 tablet 3   vitamin C (ASCORBIC ACID) 500 MG tablet Take 500 mg by mouth daily.     Wheat Dextrin (BENEFIBER PO) Take by mouth See admin instructions. Mix 1 teaspoonful of powder into 4-8 ounces of water and drink once a day as needed for mild constipation     ramipril (ALTACE) 2.5 MG capsule TAKE ONE CAPSULE BY MOUTH DAILY (Patient not taking: Reported on 03/25/2023) 90 capsule 3   No current  facility-administered medications for this visit.    Allergies:   Aricept [donepezil], Celecoxib, Ciprofloxacin, Cleocin [clindamycin], Clindamycin hcl, Erythromycin, Gabapentin, Meperidine, Meperidine hcl, Morphine, Morphine sulfate, Namenda [memantine], Tetracycline, Triprolidine-pseudoephedrine, Zolpidem, and Latex   Social History:  The patient  reports that she has never smoked. She has never used smokeless tobacco. She reports that she does not drink alcohol and does not use drugs.   Family History:  The patient's family history includes Cancer in her mother and paternal grandmother; Diabetes in her mother; Hyperlipidemia in her father; Hypertension in her father; Other in her father and mother.    ROS:  Please see the history of present illness.   Otherwise, review of systems is positive for none.   All other systems are reviewed and negative.   PHYSICAL EXAM: VS:  BP (!) 140/82   Pulse 70   Ht 5\' 1"  (1.549 m)   Wt 105 lb (47.6 kg)   SpO2 98%   BMI 19.84 kg/m  , BMI Body mass index is 19.84 kg/m. GEN: Well nourished, well developed, in no acute distress  HEENT: normal  Neck: no JVD, carotid bruits, or masses Cardiac: RRR; no murmurs, rubs, or gallops,no edema  Respiratory:  clear to auscultation bilaterally, normal work of breathing GI: soft, nontender, nondistended, + BS MS: no deformity or atrophy  Skin: warm and dry Neuro:  Strength and sensation are intact Psych: euthymic mood, full affect  EKG:  EKG is not ordered today. Personal review of the ekg ordered 01/07/23 shows sinus rhythm   Recent Labs: 09/05/2022: Magnesium 2.1 01/07/2023: Hemoglobin 11.4; Platelets 355 01/11/2023: BUN 19; Creatinine, Ser 0.88; Potassium 5.1; Sodium 140    Lipid Panel     Component Value Date/Time   CHOL 126 02/12/2022 1703   TRIG 147.0 02/12/2022 1703   HDL 45.10 02/12/2022 1703   CHOLHDL 3 02/12/2022 1703   VLDL 29.4 02/12/2022 1703   LDLCALC 52 02/12/2022 1703   LDLDIRECT 50  01/07/2023 1559   LDLDIRECT 84.0 12/11/2019 1208     Wt Readings from Last 3 Encounters:  03/25/23 105 lb (47.6 kg)  01/07/23 102 lb 12.8 oz (46.6 kg)  12/31/22 101 lb (45.8 kg)      Other studies Reviewed: Additional studies/ records that were reviewed today include: TTE 07/08/22  Review of the above records today demonstrates:   1. Left ventricular ejection fraction, by estimation, is 60 to 65%. The  left ventricle has normal function. The left ventricle has no regional  wall motion abnormalities. Left ventricular diastolic parameters are  consistent with Grade I diastolic  dysfunction (impaired relaxation).   2. Right ventricular systolic function is normal. The right ventricular  size is normal. There is normal pulmonary artery systolic pressure. The  estimated right ventricular systolic pressure is 22.0 mmHg.   3. The mitral valve is normal in structure. Mild mitral valve  regurgitation.   4. Tricuspid valve  regurgitation is mild to moderate.   5. The aortic valve is tricuspid. Aortic valve regurgitation is not  visualized. Aortic valve sclerosis/calcification is present, without any  evidence of aortic stenosis.   6. The inferior vena cava is normal in size with greater than 50%  respiratory variability, suggesting right atrial pressure of 3 mmHg.   Cardiac monitor 08/08/2022 personally reviewed SR/SB/ST Freq PVCs (4%) Freq long episodes up to 200 BPM SVT Freq runs of Afib with Fast and slow VR  ASSESSMENT AND PLAN:  1.  Paroxysmal atrial fibrillation: CHA2DS2-VASc of 3.  Currently on Eliquis and amiodarone.  She has been having vivid dreams and feeling much more fatigued.  Family is curious as to whether or not this is amiodarone.  They Koren Sermersheim hold it for 2 weeks.  2.  Syncope: Has continued to have falls.  Has had orthostatic symptoms in the past.  3.  Second hypercoagulable state: Currently on Eliquis for atrial fibrillation  4.  SVT: No further  episodes.   Current medicines are reviewed at length with the patient today.   The patient does not have concerns regarding her medicines.  The following changes were made today:  none  Labs/ tests ordered today include:  No orders of the defined types were placed in this encounter.    Disposition:   FU 6 months  Signed, Blythe Veach Jorja Loa, MD  03/25/2023 12:50 PM     [facility] [address] [place] [address] [phone] (office) [phone] (fax)

## 2023-03-25 NOTE — Patient Instructions (Signed)
Medication Instructions:  Your physician has recommended you make the following change in your medication:  Hold Amiodarone for 2 weeks.  Please contact the office and let us know if symptoms improved/continued after holding this medication.   *If you need a refill on your cardiac medications before your next appointment, please call your pharmacy*   Lab Work: None ordered If you have labs (blood work) drawn today and your tests are completely normal, you will receive your results only by: MyChart Message (if you have MyChart) OR A paper copy in the mail If you have any lab test that is abnormal or we need to change your treatment, we will call you to review the results.   Testing/Procedures: None ordered   Follow-Up: At [facility], you and your health needs are our priority.  As part of our continuing mission to provide you with exceptional heart care, we have created designated Provider Care Teams.  These Care Teams include your primary Cardiologist (physician) and Advanced Practice Providers (APPs -  Physician Assistants and Nurse Practitioners) who all work together to provide you with the care you need, when you need it.  Your next appointment:   6 month(s)  The format for your next appointment:   In Person  Provider:   You will see one of the following Advanced Practice Providers on your designated Care Team:   Francis Dowse, [place] Casimiro Needle "Mardelle Matte" Lanna Poche, [place]  Thank you for choosing [facility]!!   Dory Horn, RN [phone]  Other Instructions

## 2023-04-01 ENCOUNTER — Other Ambulatory Visit: Payer: Self-pay | Admitting: Internal Medicine

## 2023-04-05 ENCOUNTER — Encounter: Payer: Self-pay | Admitting: Internal Medicine

## 2023-04-05 ENCOUNTER — Ambulatory Visit: Payer: Medicare Other | Admitting: Internal Medicine

## 2023-04-05 VITALS — BP 122/70 | HR 85 | Temp 97.7°F | Ht 61.0 in | Wt 106.0 lb

## 2023-04-05 DIAGNOSIS — E1140 Type 2 diabetes mellitus with diabetic neuropathy, unspecified: Secondary | ICD-10-CM

## 2023-04-05 DIAGNOSIS — K219 Gastro-esophageal reflux disease without esophagitis: Secondary | ICD-10-CM

## 2023-04-05 DIAGNOSIS — F5101 Primary insomnia: Secondary | ICD-10-CM

## 2023-04-05 DIAGNOSIS — E785 Hyperlipidemia, unspecified: Secondary | ICD-10-CM

## 2023-04-05 DIAGNOSIS — K921 Melena: Secondary | ICD-10-CM

## 2023-04-05 DIAGNOSIS — F0280 Dementia in other diseases classified elsewhere without behavioral disturbance: Secondary | ICD-10-CM

## 2023-04-05 DIAGNOSIS — G309 Alzheimer's disease, unspecified: Secondary | ICD-10-CM

## 2023-04-05 LAB — COMPREHENSIVE METABOLIC PANEL
ALT: 20 U/L (ref 0–35)
AST: 25 U/L (ref 0–37)
Albumin: 4.1 g/dL (ref 3.5–5.2)
Alkaline Phosphatase: 61 U/L (ref 39–117)
BUN: 17 mg/dL (ref 6–23)
CO2: 31 mEq/L (ref 19–32)
Calcium: 9.6 mg/dL (ref 8.4–10.5)
Chloride: 101 mEq/L (ref 96–112)
Creatinine, Ser: 0.88 mg/dL (ref 0.40–1.20)
GFR: 58.01 mL/min — ABNORMAL LOW (ref >60.00)
Glucose, Bld: 108 mg/dL — ABNORMAL HIGH (ref 70–99)
Potassium: 4.9 mEq/L (ref 3.5–5.1)
Sodium: 139 mEq/L (ref 135–145)
Total Bilirubin: 0.4 mg/dL (ref 0.2–1.2)
Total Protein: 7.3 g/dL (ref 6.0–8.3)

## 2023-04-05 LAB — HEMOGLOBIN A1C: Hgb A1c MFr Bld: 6.4 % (ref 4.6–6.5)

## 2023-04-05 LAB — URINALYSIS, ROUTINE W REFLEX MICROSCOPIC
Bilirubin Urine: NEGATIVE
Hgb urine dipstick: NEGATIVE
Ketones, ur: NEGATIVE
Nitrite: NEGATIVE
Specific Gravity, Urine: 1.01 (ref 1.000–1.030)
Total Protein, Urine: NEGATIVE
Urine Glucose: NEGATIVE
Urobilinogen, UA: 0.2 (ref 0.0–1.0)
pH: 6 (ref 5.0–8.0)

## 2023-04-05 LAB — CBC WITH DIFFERENTIAL/PLATELET
Basophils Absolute: 0.1 10*3/uL (ref 0.0–0.1)
Basophils Relative: 0.7 % (ref 0.0–3.0)
Eosinophils Absolute: 0.1 10*3/uL (ref 0.0–0.7)
Eosinophils Relative: 1.4 % (ref 0.0–5.0)
HCT: 34.3 % — ABNORMAL LOW (ref 36.0–46.0)
Hemoglobin: 11.1 g/dL — ABNORMAL LOW (ref 12.0–15.0)
Lymphocytes Relative: 23.3 % (ref 12.0–46.0)
Lymphs Abs: 1.9 10*3/uL (ref 0.7–4.0)
MCHC: 32.3 g/dL (ref 30.0–36.0)
MCV: 91.6 fl (ref 78.0–100.0)
Monocytes Absolute: 0.7 10*3/uL (ref 0.1–1.0)
Monocytes Relative: 8.1 % (ref 3.0–12.0)
Neutro Abs: 5.5 10*3/uL (ref 1.4–7.7)
Neutrophils Relative %: 66.5 % (ref 43.0–77.0)
Platelets: 383 10*3/uL (ref 150.0–400.0)
RBC: 3.75 Mil/uL — ABNORMAL LOW (ref 3.87–5.11)
RDW: 13.5 % (ref 11.5–15.5)
WBC: 8.3 10*3/uL (ref 4.0–10.5)

## 2023-04-05 LAB — TSH: TSH: 0.96 u[IU]/mL (ref 0.35–5.50)

## 2023-04-05 MED ORDER — CEFUROXIME AXETIL 250 MG PO TABS: 250.0000 mg | ORAL_TABLET | Freq: Two times a day (BID) | ORAL | 1 refills | Status: AC

## 2023-04-05 NOTE — Assessment & Plan Note (Signed)
On Protonix Rx

## 2023-04-05 NOTE — Assessment & Plan Note (Signed)
On Crestor 

## 2023-04-05 NOTE — Assessment & Plan Note (Signed)
Toni Mcmillan,  Aricept 5 - d/c (intolerant)  The pt is moving to [place] to live  w/her son Toni Mcmillan Paula

## 2023-04-05 NOTE — Assessment & Plan Note (Signed)
[place],  Aricept 5 - d/c (intolerant)

## 2023-04-05 NOTE — Assessment & Plan Note (Signed)
Check labs 

## 2023-04-05 NOTE — Progress Notes (Signed)
Subjective:  Patient ID: Toni Mcmillan, female    DOB: 09-Jun-1931  Age: 89 y.o. MRN: [id number]  CC: Follow-up (4 mnth f/u, issues with sleeping and having bad dreams, trouble with identifying night and day, blood in stool)   HPI Toni Mcmillan presents for dementia The pt is moving to [place] to live  w/her son Toni Mcmillan issues with sleeping and having bad dreams, trouble with identifying night and day, blood in stool...   Outpatient Medications Prior to Visit  Medication Sig Dispense Refill   amiodarone (PACERONE) 200 MG tablet Take 1 tablet (200 mg total) by mouth daily. 90 tablet 3   aspirin-acetaminophen-caffeine (EXCEDRIN MIGRAINE) 250-250-65 MG tablet Take 2 tablets by mouth as needed for headache. Pt takes 250-65 mg as needed.     Cholecalciferol (VITAMIN D-3) 25 MCG (1000 UT) CAPS Take 1,000 Units by mouth daily.     Multiple Vitamins-Minerals (ONE-A-DAY WOMENS 50+) TABS Take 1 tablet by mouth daily with breakfast.     Omega-3 Fatty Acids (FISH OIL PO) Take 1,000 mg by mouth every morning.     pantoprazole (PROTONIX) 40 MG tablet TAKE 1 TABLET BY MOUTH DAILY 30 tablet 2   Polyvinyl Alcohol-Povidone (REFRESH OP) Place 1-2 drops into both eyes daily as needed (dry eyes).     rosuvastatin (CRESTOR) 10 MG tablet Take 1 tablet (10 mg total) by mouth daily. 90 tablet 3   vitamin C (ASCORBIC ACID) 500 MG tablet Take 500 mg by mouth daily.     Wheat Dextrin (BENEFIBER PO) Take by mouth See admin instructions. Mix 1 teaspoonful of powder into 4-8 ounces of water and drink once a day as needed for mild constipation     ramipril (ALTACE) 2.5 MG capsule TAKE ONE CAPSULE BY MOUTH DAILY (Patient not taking: Reported on 03/25/2023) 90 capsule 3   No facility-administered medications prior to visit.    ROS: Review of Systems  Constitutional:  Positive for fatigue. Negative for activity change, appetite change, chills and unexpected weight change.  HENT:  Negative for  congestion, mouth sores and sinus pressure.   Eyes:  Negative for visual disturbance.  Respiratory:  Negative for cough and chest tightness.   Gastrointestinal:  Positive for blood in stool. Negative for abdominal pain, nausea, rectal pain and vomiting.  Genitourinary:  Negative for difficulty urinating, frequency and vaginal pain.  Musculoskeletal:  Negative for back pain and gait problem.  Skin:  Negative for pallor and rash.  Neurological:  Positive for weakness. Negative for dizziness, tremors, syncope, numbness and headaches.  Psychiatric/Behavioral:  Positive for confusion and decreased concentration. Negative for sleep disturbance and suicidal ideas. The patient is not nervous/anxious.     Objective:  BP 122/70 (BP Location: Left Arm, Patient Position: Sitting, Cuff Size: Large)   Pulse 85   Temp 97.7 F (36.5 C) (Oral)   Ht 5\' 1"  (1.549 m)   Wt 106 lb (48.1 kg)   SpO2 95%   BMI 20.03 kg/m   BP Readings from Last 3 Encounters:  04/05/23 122/70  03/25/23 (!) 140/82  01/07/23 138/82    Wt Readings from Last 3 Encounters:  04/05/23 106 lb (48.1 kg)  03/25/23 105 lb (47.6 kg)  01/07/23 102 lb 12.8 oz (46.6 kg)    Physical Exam Constitutional:      General: She is not in acute distress.    Appearance: Normal appearance. She is well-developed.  HENT:     Head: Normocephalic.  Right Ear: External ear normal.     Left Ear: External ear normal.     Nose: Nose normal.  Eyes:     General:        Right eye: No discharge.        Left eye: No discharge.     Conjunctiva/sclera: Conjunctivae normal.     Pupils: Pupils are equal, round, and reactive to light.  Neck:     Thyroid: No thyromegaly.     Vascular: No JVD.     Trachea: No tracheal deviation.  Cardiovascular:     Rate and Rhythm: Normal rate and regular rhythm.     Heart sounds: Normal heart sounds.  Pulmonary:     Effort: No respiratory distress.     Breath sounds: No stridor. No wheezing.  Abdominal:      General: Bowel sounds are normal. There is no distension.     Palpations: Abdomen is soft. There is no mass.     Tenderness: There is no abdominal tenderness. There is no guarding or rebound.  Musculoskeletal:        General: No tenderness.     Cervical back: Normal range of motion and neck supple. No rigidity.  Lymphadenopathy:     Cervical: No cervical adenopathy.  Skin:    Findings: No erythema or rash.  Neurological:     Mental Status: Mental status is at baseline.     Cranial Nerves: No cranial nerve deficit.     Motor: No abnormal muscle tone.     Coordination: Coordination normal.     Deep Tendon Reflexes: Reflexes normal.  Psychiatric:        Behavior: Behavior normal.        Thought Content: Thought content normal.        Judgment: Judgment normal.     Lab Results  Component Value Date   WBC 7.7 01/07/2023   HGB 11.4 01/07/2023   HCT 35.3 01/07/2023   PLT 355 01/07/2023   GLUCOSE 103 (H) 01/11/2023   CHOL 126 02/12/2022   TRIG 147.0 02/12/2022   HDL 45.10 02/12/2022   LDLDIRECT 50 01/07/2023   LDLCALC 52 02/12/2022   ALT 12 03/05/2022   AST 20 03/05/2022   NA 140 01/11/2023   K 5.1 01/11/2023   CL 102 01/11/2023   CREATININE 0.88 01/11/2023   BUN 19 01/11/2023   CO2 25 01/11/2023   TSH 1.503 03/04/2022   HGBA1C 5.6 09/05/2022   MICROALBUR 1.4 02/12/2022    DG Sacrum/Coccyx  Result Date: 12/30/2022 CLINICAL DATA:  Status post slip and fall. EXAM: SACRUM AND COCCYX - 2+ VIEW COMPARISON:  None Available. FINDINGS: A small fracture deformity of indeterminate age is seen along the anterior aspect of the lower sacrum. This is best seen on the lateral view. There is no evidence of dislocation. Degenerative changes are seen within the visualized portions of both hips. IMPRESSION: Small fracture deformity of indeterminate age along the anterior aspect of the lower sacrum. Electronically Signed   By: Aram Candela M.D.   On: 12/30/2022 18:31   DG Elbow Complete  Right  Result Date: 12/30/2022 CLINICAL DATA:  Status post slip and fall. EXAM: RIGHT ELBOW - COMPLETE 3+ VIEW COMPARISON:  None Available. FINDINGS: There is no evidence of acute fracture, dislocation, or joint effusion. Mild chronic and degenerative changes are seen adjacent to the dorsal aspect of the right radial head. Soft tissues are unremarkable. IMPRESSION: Mild chronic and degenerative changes without evidence of acute fracture  or dislocation. Electronically Signed   By: Aram Candela M.D.   On: 12/30/2022 18:21   CT Cervical Spine Wo Contrast  Result Date: 12/30/2022 CLINICAL DATA:  Status post slip and fall. EXAM: CT CERVICAL SPINE WITHOUT CONTRAST TECHNIQUE: Multidetector CT imaging of the cervical spine was performed without intravenous contrast. Multiplanar CT image reconstructions were also generated. RADIATION DOSE REDUCTION: This exam was performed according to the departmental dose-optimization program which includes automated exposure control, adjustment of the mA and/or kV according to patient size and/or use of iterative reconstruction technique. COMPARISON:  July 03, 2022 FINDINGS: Alignment: Normal. Skull base and vertebrae: No acute fracture. Degenerative changes are seen involving the body and tip of the dens, as well as the adjacent portion of the anterior arch of C1. Soft tissues and spinal canal: No prevertebral fluid or swelling. No visible canal hematoma. Disc levels: There is marked severity endplate sclerosis, mild anterior osteophyte formation and moderate severity posterior bony spurring at the levels of C3-C4, C4-C5, C5-C6 and C6-C7. There is marked severity narrowing of the anterior atlantoaxial articulation. Marked severity intervertebral disc space narrowing is seen at C3-C4, C4-C5, C5-C6 and C6-C7. Upper chest: Negative. Other: None. IMPRESSION: Marked severity multilevel degenerative changes without evidence of an acute fracture or subluxation. Electronically  Signed   By: Aram Candela M.D.   On: 12/30/2022 18:20   CT Head Wo Contrast  Result Date: 12/30/2022 CLINICAL DATA:  Status post slip and fall. EXAM: CT HEAD WITHOUT CONTRAST TECHNIQUE: Contiguous axial images were obtained from the base of the skull through the vertex without intravenous contrast. RADIATION DOSE REDUCTION: This exam was performed according to the departmental dose-optimization program which includes automated exposure control, adjustment of the mA and/or kV according to patient size and/or use of iterative reconstruction technique. COMPARISON:  August 25, 2022 FINDINGS: Brain: There is mild cerebral atrophy with widening of the extra-axial spaces and ventricular dilatation. There are areas of decreased attenuation within the white matter tracts of the supratentorial brain, consistent with microvascular disease changes. Vascular: There is moderate severity bilateral cavernous carotid artery calcification. Skull: Normal. Negative for fracture or focal lesion. Sinuses/Orbits: No acute finding. Other: None. IMPRESSION: 1. No acute intracranial abnormality. 2. Generalized cerebral atrophy and microvascular disease changes of the supratentorial brain. Electronically Signed   By: Aram Candela M.D.   On: 12/30/2022 18:18    Assessment & Plan:   Problem List Items Addressed This Visit     Type 2 diabetes, controlled, with neuropathy (HCC) - Primary (Chronic)    Check labs      Relevant Orders   TSH   Urinalysis   CBC with Differential/Platelet   Comprehensive metabolic panel   Hemoglobin A1c   Insomnia    Toni Mcmillan,  Aricept 5 - d/c (intolerant)      Relevant Orders   TSH   Urinalysis   CBC with Differential/Platelet   Comprehensive metabolic panel   Hemoglobin A1c   Hematochezia   Relevant Orders   Ambulatory referral to Gastroenterology   GERD (gastroesophageal reflux disease)    On Protonix Rx       Alzheimer disease (HCC)    Toni Mcmillan,  Aricept 5 - d/c  (intolerant)  The pt is moving to [place] to live  w/her son Toni Mcmillan      Hyperlipidemia    On Crestor      Relevant Orders   TSH   Urinalysis   CBC with Differential/Platelet   Comprehensive metabolic panel  Hemoglobin A1c      No orders of the defined types were placed in this encounter.     Follow-up: Return in about 2 months (around 06/05/2023) for a follow-up visit.  Sonda Primes, MD

## 2023-04-05 NOTE — Assessment & Plan Note (Signed)
GI ref To ER if worse

## 2023-04-11 ENCOUNTER — Encounter: Payer: Self-pay | Admitting: Cardiology

## 2023-04-12 ENCOUNTER — Encounter: Payer: Self-pay | Admitting: Cardiovascular Disease

## 2023-04-12 ENCOUNTER — Ambulatory Visit: Payer: Medicare Other | Attending: Cardiovascular Disease | Admitting: Cardiovascular Disease

## 2023-04-12 VITALS — BP 136/84 | HR 66 | Ht 61.0 in | Wt 109.6 lb

## 2023-04-12 DIAGNOSIS — E785 Hyperlipidemia, unspecified: Secondary | ICD-10-CM

## 2023-04-12 DIAGNOSIS — R55 Syncope and collapse: Secondary | ICD-10-CM

## 2023-04-12 DIAGNOSIS — I10 Essential (primary) hypertension: Secondary | ICD-10-CM | POA: Diagnosis not present

## 2023-04-12 DIAGNOSIS — I480 Paroxysmal atrial fibrillation: Secondary | ICD-10-CM

## 2023-04-12 NOTE — Assessment & Plan Note (Signed)
History of dyslipidemia on low-dose rosuvastatin with lipid profile performed 02/12/2022 revealing total cholesterol 126, LDL 52 and HDL 45.

## 2023-04-12 NOTE — Assessment & Plan Note (Signed)
History of syncopal episodes thought to be orthostatic.  As a result, she was taken off her oral anticoagulation for A-fib.

## 2023-04-12 NOTE — Assessment & Plan Note (Signed)
History of PAF maintaining sinus rhythm on amiodarone.  Because of some potential side effects this was discontinued by Dr. Elberta Fortis.  She no longer is on a DOAC because of fall risk.

## 2023-04-12 NOTE — Progress Notes (Signed)
04/12/2023 Toni Mcmillan   [birth year]  [id number]  Primary Physician Plotnikov, Georgina Quint, MD Primary Cardiologist: Runell Gess MD FACP, [place], [place], [place]  HPI:  Toni Mcmillan is a 89 y.o.   thin-appearing recently widowed (husband Toni Mcmillan of 70 years passed away recently) Caucasian female mother of 3, grandmother 7 grandchildren who I last saw in the office 10//23 she is accompanied by one of her daughters Toni Mcmillan  today.  She was seen for dizziness 6 years ago. She has a history of treated hypertension and hyperlipidemia. She has never had a heart attack or stroke. She does complain of some dyspnea on exertion. She's had some recent dizziness while standing in the kitchen with tachycardia palpitations when she feels her pulse.She denies chest pain.  I did obtain an event monitor that showed only PVCs at that time.  2D echo was normal.  She presented to the emergency room with an episode of syncope on 03/04/2022.  And EMS she was documented to have A-fib with a variable ventricular sponsor from 110 to 160 bpm but converted to sinus rhythm when she arrived.  She did have a chest CT that showed no pulmonary embolism but did show severe coronary calcification.  She continues to deny chest pain.  She was seen by Alphonzo Severance, PA-C in the A-fib clinic on 03/08/2022 and was placed on low-dose Eliquis oral anticoagulation.  She currently lives alone, does not drive but has family close by.   Since I saw her 8 months ago she continues to do well.  She did have an episode of near syncope about a month ago.  She had an an event monitor that showed tachybradycardia syndrome with A-fib with both fast and slow ventricular response as well as SVT with heart rates up in the 200 range.  A 2D echo performed 07/08/2022 was essentially normal with grade 1 diastolic dysfunction and mild to moderate TR. I did refer her to Dr. Elberta Fortis who placed her on amiodarone.Marland Kitchen  Unfortunately, because of feelings of weakness  and fatigue this was recently discontinued as was her Eliquis because of fall risk.  She denies chest pain or shortness of breath.  Current Meds  Medication Sig   aspirin-acetaminophen-caffeine (EXCEDRIN MIGRAINE) 250-250-65 MG tablet Take 2 tablets by mouth as needed for headache. Pt takes 250-65 mg as needed.   cefUROXime (CEFTIN) 250 MG tablet Take 1 tablet (250 mg total) by mouth 2 (two) times daily with a meal.   Cholecalciferol (VITAMIN D-3) 25 MCG (1000 UT) CAPS Take 1,000 Units by mouth daily.   Multiple Vitamins-Minerals (ONE-A-DAY WOMENS 50+) TABS Take 1 tablet by mouth daily with breakfast.   Omega-3 Fatty Acids (FISH OIL PO) Take 1,000 mg by mouth every morning.   pantoprazole (PROTONIX) 40 MG tablet TAKE 1 TABLET BY MOUTH DAILY   Polyvinyl Alcohol-Povidone (REFRESH OP) Place 1-2 drops into both eyes daily as needed (dry eyes).   ramipril (ALTACE) 2.5 MG capsule TAKE ONE CAPSULE BY MOUTH DAILY   rosuvastatin (CRESTOR) 10 MG tablet Take 1 tablet (10 mg total) by mouth daily.   vitamin C (ASCORBIC ACID) 500 MG tablet Take 500 mg by mouth daily.   Wheat Dextrin (BENEFIBER PO) Take by mouth See admin instructions. Mix 1 teaspoonful of powder into 4-8 ounces of water and drink once a day as needed for mild constipation     Allergies  Allergen Reactions   Aricept [Donepezil]     Syncope   Celecoxib  Other (See Comments)    Reaction not recalled    Ciprofloxacin Swelling and Other (See Comments)    Caused wrist swelling   Cleocin [Clindamycin] Itching   Clindamycin Hcl Itching   Erythromycin Other (See Comments)    GI upset   Gabapentin Other (See Comments)    Made the patient feel not well the next day   Meperidine Itching   Meperidine Hcl Itching   Morphine Itching   Morphine Sulfate Itching   Namenda [Memantine]     sleepy   Tetracycline Other (See Comments)    GI upset   Triprolidine-Pseudoephedrine Other (See Comments)    Reaction not recalled   Zolpidem Other (See  Comments)    "Did not have a good sleep"   Latex Itching and Rash    Social History   Socioeconomic History   Marital status: Married    Spouse name: Not on file   Number of children: Not on file   Years of education: Not on file   Highest education level: Not on file  Occupational History   Not on file  Tobacco Use   Smoking status: Never   Smokeless tobacco: Never   Tobacco comments:    Never smoke 03/08/22  Substance and Sexual Activity   Alcohol use: No    Alcohol/week: 0.0 standard drinks of alcohol   Drug use: No   Sexual activity: Not on file  Other Topics Concern   Not on file  Social History Narrative   7 grand children, 4 great-grandchildren   Mostly homemaker, 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, 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   Death of step- grandson by suicide- 9-10   Social Determinants of Health   Financial Resource Strain: Low Risk  (08/25/2022)   Overall Financial Resource Strain (CARDIA)    Difficulty of Paying Living Expenses: Not hard at all  Food Insecurity: No Food Insecurity (08/25/2022)   Hunger Vital Sign    Worried About Running Out of Food in the Last Year: Never true    Ran Out of Food in the Last Year: Never true  Transportation Needs: No Transportation Needs (08/25/2022)   PRAPARE - Administrator, Civil Service (Medical): No    Lack of Transportation (Non-Medical): No  Physical Activity: Sufficiently Active (08/25/2022)   Exercise Vital Sign    Days of Exercise per Week: 5 days    Minutes of Exercise per Session: 30 min  Stress: No Stress Concern Present (08/25/2022)   [employer] of Occupational Health - Occupational Stress Questionnaire    Feeling of Stress : Not at all  Social Connections: Moderately Integrated (08/25/2022)   Social Connection and Isolation Panel [NHANES]    Frequency of Communication with Friends and Family: Mcmillan than three times a week    Frequency of Social Gatherings with Friends and Family: Mcmillan than  three times a week    Attends Religious Services: Mcmillan than 4 times per year    Active Member of [employer] or Organizations: Yes    Attends Banker Meetings: Mcmillan than 4 times per year    Marital Status: Widowed  Intimate Partner Violence: Not At Risk (08/25/2022)   Humiliation, Afraid, Rape, and Kick questionnaire    Fear of Current or Ex-Partner: No    Emotionally Abused: No    Physically Abused: No    Sexually Abused: No     Review of Systems: General: negative for chills, fever, night sweats or weight changes.  Cardiovascular: negative  for chest pain, dyspnea on exertion, edema, orthopnea, palpitations, paroxysmal nocturnal dyspnea or shortness of breath Dermatological: negative for rash Respiratory: negative for cough or wheezing Urologic: negative for hematuria Abdominal: negative for nausea, vomiting, diarrhea, bright red blood per rectum, melena, or hematemesis Neurologic: negative for visual changes, syncope, or dizziness All other systems reviewed and are otherwise negative except as noted above.    Blood pressure 136/84, pulse 66, height 5\' 1"  (1.549 m), weight 109 lb 9.6 oz (49.7 kg), SpO2 97 %.  General appearance: alert and no distress Neck: no adenopathy, no carotid bruit, no JVD, supple, symmetrical, trachea midline, and thyroid not enlarged, symmetric, no tenderness/mass/nodules Lungs: clear to auscultation bilaterally Heart: regular rate and rhythm, S1, S2 normal, no murmur, click, rub or gallop Extremities: extremities normal, atraumatic, no cyanosis or edema Pulses: 2+ and symmetric Skin: Skin color, texture, turgor normal. No rashes or lesions Neurologic: Grossly normal  EKG sinus rhythm at 66 without ST or T wave changes.  Personally reviewed this EKG.  ASSESSMENT AND PLAN:   Essential hypertension History of essential hypertension blood pressure measured today at 136/84.  She is on ramipril.  Dyslipidemia History of dyslipidemia on low-dose  rosuvastatin with lipid profile performed 02/12/2022 revealing total cholesterol 126, LDL 52 and HDL 45.  Syncope and collapse History of syncopal episodes thought to be orthostatic.  As a result, she was taken off her oral anticoagulation for A-fib.  PAF (paroxysmal atrial fibrillation) (HCC) History of PAF maintaining sinus rhythm on amiodarone.  Because of some potential side effects this was discontinued by Dr. Elberta Fortis.  She no longer is on a DOAC because of fall risk.     Runell Gess MD FACP,FACC,FAHA, [facility] 04/12/2023 4:36 PM

## 2023-04-12 NOTE — Patient Instructions (Signed)
Medication Instructions:  Your physician recommends that you continue on your current medications as directed. Please refer to the Current Medication list given to you today.  *If you need a refill on your cardiac medications before your next appointment, please call your pharmacy*   Follow-Up: At [facility], you and your health needs are our priority.  As part of our continuing mission to provide you with exceptional heart care, we have created designated Provider Care Teams.  These Care Teams include your primary Cardiologist (physician) and Advanced Practice Providers (APPs -  Physician Assistants and Nurse Practitioners) who all work together to provide you with the care you need, when you need it.  We recommend signing up for the patient portal called "MyChart".  Sign up information is provided on this After Visit Summary.  MyChart is used to connect with patients for Virtual Visits (Telemedicine).  Patients are able to view lab/test results, encounter notes, upcoming appointments, etc.  Non-urgent messages can be sent to your provider as well.   To learn more about what you can do with MyChart, go to [email or web address].    Your next appointment:   6 month(s)  Provider:   Edd Fabian, FNP or Joni Reining, DNP, ANP      Then, Nanetta Batty, MD will plan to see you again in 12 month(s).

## 2023-04-12 NOTE — Assessment & Plan Note (Signed)
History of essential hypertension blood pressure measured today at 136/84.  She is on ramipril.

## 2023-04-13 ENCOUNTER — Telehealth: Payer: Self-pay | Admitting: Cardiology

## 2023-04-13 NOTE — Telephone Encounter (Signed)
Pt c/o medication issue:  1. Name of Medication:   amiodarone (PACERONE) 200 MG tablet    2. How are you currently taking this medication (dosage and times per day)? Take 1 tablet (200 mg total) by mouth daily.Patient not taking: Reported on 04/12/2023   3. Are you having a reaction (difficulty breathing--STAT)? No  4. What is your medication issue? Pt's daughter states that pt was to stop medication for 2 weeks and f/u. Daughter would like a callback as to what the next step would be. Daughter also sent MyChart message. Please advise

## 2023-04-13 NOTE — Telephone Encounter (Signed)
Left message to call back  

## 2023-04-14 NOTE — Telephone Encounter (Signed)
See my chart message

## 2023-05-09 ENCOUNTER — Other Ambulatory Visit: Payer: Self-pay | Admitting: Internal Medicine

## 2023-05-09 ENCOUNTER — Encounter: Payer: Self-pay | Admitting: Internal Medicine

## 2023-05-09 MED ORDER — BREXPIPRAZOLE 0.25 MG PO TABS: 0.2500 mg | ORAL_TABLET | Freq: Every day | ORAL | 3 refills | Status: DC

## 2023-05-18 ENCOUNTER — Other Ambulatory Visit (HOSPITAL_COMMUNITY): Payer: Self-pay

## 2023-05-18 ENCOUNTER — Telehealth: Payer: Self-pay

## 2023-05-18 NOTE — Telephone Encounter (Signed)
Patient Advocate Encounter   Received notification from [employer] Part D that prior authorization is required for Rexulti 0.25MG  tablets   Submitted: 05-18-2023 Key [id number]  Status is pending

## 2023-05-30 ENCOUNTER — Other Ambulatory Visit (HOSPITAL_COMMUNITY): Payer: Self-pay

## 2023-05-30 NOTE — Telephone Encounter (Signed)
Pharmacy Patient Advocate Encounter  Received notification from [facility] Medicare that Prior Authorization for Rexulti 0.25MG  tablets has been APPROVED from 05-19-2023 to 11-15-2023.Marland Kitchen  PA #/Case ID/Reference #: Key: [id number]  Patient is in the donut hole therefore copays are going to be high.  Copay is $375.46 per 30 day supply Copay is $1,421.82 per 90 day supply

## 2023-06-03 MED ORDER — ARIPIPRAZOLE 2 MG PO TABS: 2.0000 mg | ORAL_TABLET | Freq: Every day | ORAL | 5 refills | Status: AC

## 2023-06-03 NOTE — Telephone Encounter (Signed)
Will try Abilify. It should cost less Thx

## 2023-06-03 NOTE — Telephone Encounter (Signed)
Sent mychart msg with MD response...Raechel Chute

## 2023-06-03 NOTE — Addendum Note (Signed)
Addended by: Tresa Garter on: 06/03/2023 09:35 AM   Modules accepted: Orders

## 2023-07-09 ENCOUNTER — Encounter: Payer: Self-pay | Admitting: Internal Medicine

## 2023-07-11 MED ORDER — PANTOPRAZOLE SODIUM 40 MG PO TBEC: 40.0000 mg | DELAYED_RELEASE_TABLET | Freq: Every day | ORAL | 5 refills | Status: AC

## 2023-10-24 ENCOUNTER — Telehealth: Payer: Self-pay | Admitting: Internal Medicine

## 2023-10-24 NOTE — Telephone Encounter (Signed)
Contacted Toni Mcmillan to schedule their annual wellness visit. Patient declined to schedule AWV at this time. [place] of state.  [facility] Care Guide [facility] AWV TEAM Direct Dial: [phone]
